# Patient Record
Sex: Male | Born: 1960 | Hispanic: Refuse to answer | Smoking: Former smoker
Health system: Southern US, Community
[De-identification: ages and names within clinical notes are randomized; demographics above are authoritative.]

## PROBLEM LIST (undated history)

## (undated) DIAGNOSIS — K219 Gastro-esophageal reflux disease without esophagitis: Secondary | ICD-10-CM

## (undated) DIAGNOSIS — E119 Type 2 diabetes mellitus without complications: Secondary | ICD-10-CM

## (undated) DIAGNOSIS — E7800 Pure hypercholesterolemia, unspecified: Secondary | ICD-10-CM

## (undated) DIAGNOSIS — E039 Hypothyroidism, unspecified: Secondary | ICD-10-CM

## (undated) DIAGNOSIS — J40 Bronchitis, not specified as acute or chronic: Secondary | ICD-10-CM

## (undated) DIAGNOSIS — I2510 Atherosclerotic heart disease of native coronary artery without angina pectoris: Secondary | ICD-10-CM

## (undated) DIAGNOSIS — Z8701 Personal history of pneumonia (recurrent): Secondary | ICD-10-CM

## (undated) DIAGNOSIS — J449 Chronic obstructive pulmonary disease, unspecified: Secondary | ICD-10-CM

## (undated) DIAGNOSIS — Z720 Tobacco use: Secondary | ICD-10-CM

## (undated) DIAGNOSIS — J069 Acute upper respiratory infection, unspecified: Secondary | ICD-10-CM

## (undated) DIAGNOSIS — G4730 Sleep apnea, unspecified: Secondary | ICD-10-CM

## (undated) HISTORY — PX: MANDIBLE SURGERY: SHX707

## (undated) HISTORY — DX: Acute upper respiratory infection, unspecified: J06.9

## (undated) HISTORY — PX: OTHER SURGICAL HISTORY: SHX169

---

## 2010-05-22 ENCOUNTER — Ambulatory Visit: Payer: Self-pay | Admitting: Cardiology

## 2011-06-15 ENCOUNTER — Encounter: Payer: Self-pay | Admitting: Cardiovascular Disease

## 2011-06-27 ENCOUNTER — Encounter: Payer: Self-pay | Admitting: *Deleted

## 2011-06-29 ENCOUNTER — Encounter: Payer: Self-pay | Admitting: Cardiovascular Disease

## 2011-07-02 ENCOUNTER — Encounter: Payer: Self-pay | Admitting: Cardiovascular Disease

## 2012-12-19 ENCOUNTER — Emergency Department (HOSPITAL_COMMUNITY): Payer: Self-pay

## 2012-12-19 ENCOUNTER — Emergency Department (HOSPITAL_COMMUNITY)
Admission: EM | Admit: 2012-12-19 | Discharge: 2012-12-19 | Disposition: A | Discharge status: Home / Self Care | Payer: Self-pay | Attending: Emergency Medicine | Admitting: Emergency Medicine

## 2012-12-19 ENCOUNTER — Encounter (HOSPITAL_COMMUNITY): Payer: Self-pay

## 2012-12-19 ENCOUNTER — Other Ambulatory Visit: Payer: Self-pay

## 2012-12-19 DIAGNOSIS — Z79899 Other long term (current) drug therapy: Secondary | ICD-10-CM | POA: Insufficient documentation

## 2012-12-19 DIAGNOSIS — M542 Cervicalgia: Secondary | ICD-10-CM | POA: Insufficient documentation

## 2012-12-19 DIAGNOSIS — Z7982 Long term (current) use of aspirin: Secondary | ICD-10-CM | POA: Insufficient documentation

## 2012-12-19 DIAGNOSIS — R209 Unspecified disturbances of skin sensation: Secondary | ICD-10-CM | POA: Insufficient documentation

## 2012-12-19 DIAGNOSIS — R200 Anesthesia of skin: Secondary | ICD-10-CM

## 2012-12-19 DIAGNOSIS — J45909 Unspecified asthma, uncomplicated: Secondary | ICD-10-CM | POA: Insufficient documentation

## 2012-12-19 DIAGNOSIS — F17200 Nicotine dependence, unspecified, uncomplicated: Secondary | ICD-10-CM | POA: Insufficient documentation

## 2012-12-19 DIAGNOSIS — E039 Hypothyroidism, unspecified: Secondary | ICD-10-CM | POA: Insufficient documentation

## 2012-12-19 LAB — BASIC METABOLIC PANEL
BUN: 13 mg/dL (ref 6–23)
Chloride: 95 mEq/L — ABNORMAL LOW (ref 96–112)
Glucose, Bld: 101 mg/dL — ABNORMAL HIGH (ref 70–99)
Potassium: 3.5 mEq/L (ref 3.5–5.1)

## 2012-12-19 LAB — CBC WITH DIFFERENTIAL/PLATELET
Basophils Relative: 1 % (ref 0–1)
HCT: 46.4 % (ref 39.0–52.0)
Hemoglobin: 16.5 g/dL (ref 13.0–17.0)
Lymphocytes Relative: 48 % — ABNORMAL HIGH (ref 12–46)
MCHC: 35.6 g/dL (ref 30.0–36.0)
Monocytes Relative: 9 % (ref 3–12)
Neutro Abs: 5.3 10*3/uL (ref 1.7–7.7)
WBC: 13.3 10*3/uL — ABNORMAL HIGH (ref 4.0–10.5)

## 2012-12-19 LAB — TROPONIN I: Troponin I: <0.3 ng/mL (ref <0.30)

## 2012-12-19 NOTE — ED Notes (Signed)
C/o numbness to left side of neck x 2 days. Denies sob or cp. Able to reproduce pain to neck and arm with palpation per pt.

## 2012-12-19 NOTE — ED Provider Notes (Signed)
History  This chart was scribed for Joya Gaskins, MD by Bennett Scrape, ED Scribe. This patient was seen in room APA15/APA15 and the patient's care was started at 2:11 PM.  CSN: [id number]  Arrival date & time 12/19/12  1356   First MD Initiated Contact with Patient 12/19/12 1411      Chief Complaint  Patient presents with  . Numbness     The history is provided by the patient. No language interpreter was used.    James Huynh is a 52 y.o. male, brought in by ambulance, who presents to the Emergency Department complaining of 2 days of graudal onset, non-changing, constant left-sided neck numbness with mild associated left-sided tenderness and separate left arm tingling. He denies radiation from the back of his neck or down his arm. He denies any modifying factors, specifically that swallowing worsens the pain. He denies having prior episodes of similar symptoms. He denies any recent falls or trauma. He denies extremity weakness, HAs, visual changes, dizziness, SOB and trouble swallowing as associated symptoms.  He reports three prior episodes of CP in the past and states that he has had prior negative stress tests, last one was in June 2013, but denies CP today. He has a h/o hypothyroidism, HLD, and asthma. He is a current everyday smoker but denies alcohol use.  Past Medical History  Diagnosis Date  . Thyroid disease     hypothyroidism  . Asthma   . Chest pain     negative stress echo August 2011  . Nicotine abuse     Past Surgical History  Procedure Laterality Date  . Lymph gland removal    . Mandible surgery      broken jaw    Family History  Problem Relation Age of Onset  . Heart attack Father     history of MI in his 76's with bypass surgery    History  Substance Use Topics  . Smoking status: Current Every Day Smoker -- 1.00 packs/day  . Smokeless tobacco: Not on file  . Alcohol Use: No      Review of Systems  HENT: Negative for trouble swallowing.    Eyes: Negative for visual disturbance.  Respiratory: Negative for shortness of breath.   Cardiovascular: Negative for chest pain.  Gastrointestinal: Negative for abdominal pain.  Neurological: Positive for numbness. Negative for dizziness, speech difficulty, weakness, light-headedness and headaches.  All other systems reviewed and are negative.    Allergies  Penicillins  Home Medications   Current Outpatient Rx  Name  Route  Sig  Dispense  Refill  . albuterol (PROVENTIL HFA;VENTOLIN HFA) 108 (90 BASE) MCG/ACT inhaler   Inhalation   Inhale 2 puffs into the lungs every 6 (six) hours as needed.           [name] albuterol (PROVENTIL) (2.5 MG/3ML) 0.083% nebulizer solution   Nebulization   Take 2.5 mg by nebulization every 6 (six) hours as needed.           [name] aspirin 81 MG tablet   Oral   Take 81 mg by mouth daily.           [name] levothyroxine (SYNTHROID, LEVOTHROID) 175 MCG tablet   Oral   Take 175 mcg by mouth daily.             Triage Vitals: BP 108/76  Pulse 95  Temp(Src) 97.8 F (36.6 C)  Resp 16  SpO2 97%  Physical Exam  Nursing note and vitals reviewed.  CONSTITUTIONAL: Well developed/well nourished HEAD: Normocephalic/atraumatic EYES: EOMI/PERRL, no nystagmus ENMT: Mucous membranes moist, handling secretions NECK: supple no meningeal signs, no reproducible tenderness upon palpation No carotid bruits SPINE:entire spine nontender CV: S1/S2 noted, no murmurs/rubs/gallops noted LUNGS: Lungs are clear to auscultation bilaterally, no apparent distress ABDOMEN: soft, nontender, no rebound or guarding GU:no cva tenderness NEURO: Awake/alert, facies symmetric, no arm or leg drift is noted, normal finger to nose, reports numbness in left medial bicep and dorsal aspect of left forearm only.   Equal power with hand grip, wrist flex/extension, elbow flex/extension and shoulder abduction Cranial nerves 3/4/5/6/04/22/09/11/12 tested and intact Gait normal EXTREMITIES:  pulses normal, full ROM SKIN: warm, color normal PSYCH: no abnormalities of mood noted  ED Course  Procedures (including critical care time)  DIAGNOSTIC STUDIES: Oxygen Saturation is 97% on room air, adequate by my interpretation.    COORDINATION OF CARE: 2:36 PM-Discussed treatment plan which includes XR of neck, CT of head, CBC panel and UA with pt at bedside and pt agreed to plan.  Pt with scattered numbness and "tingling" in left UE but does not conform to true radiculopathy or even acute CVA Imaging ordered as precaution to rule out any space occupying lesion or intracranial abnormality or any bony deformity to cspine   If negative will be stable for d/c and followup as outpatient  Labs Reviewed  CBC WITH DIFFERENTIAL - Abnormal; Notable for the following:    WBC 13.3 (*)    All other components within normal limits  TROPONIN I  BASIC METABOLIC PANEL      MDM  Nursing notes including past medical history and social history reviewed and considered in documentation Labs/vital reviewed and considered xrays reviewed and considered       Date: 12/19/2012  Rate: 79  Rhythm: normal sinus rhythm  QRS Axis: normal  Intervals: normal  ST/T Wave abnormalities: nonspecific ST changes  Conduction Disutrbances:none  Narrative Interpretation:   Old EKG Reviewed: unchanged    I personally performed the services described in this documentation, which was scribed in my presence. The recorded information has been reviewed and is accurate.          Joya Gaskins, MD 12/19/12 1739

## 2013-01-18 ENCOUNTER — Emergency Department (HOSPITAL_COMMUNITY): Payer: Self-pay

## 2013-01-18 ENCOUNTER — Emergency Department (HOSPITAL_COMMUNITY)
Admission: EM | Admit: 2013-01-18 | Discharge: 2013-01-18 | Disposition: A | Discharge status: Home / Self Care | Payer: Self-pay | Attending: Emergency Medicine | Admitting: Emergency Medicine

## 2013-01-18 ENCOUNTER — Encounter (HOSPITAL_COMMUNITY): Payer: Self-pay | Admitting: Emergency Medicine

## 2013-01-18 DIAGNOSIS — Z79899 Other long term (current) drug therapy: Secondary | ICD-10-CM | POA: Insufficient documentation

## 2013-01-18 DIAGNOSIS — Z7982 Long term (current) use of aspirin: Secondary | ICD-10-CM | POA: Insufficient documentation

## 2013-01-18 DIAGNOSIS — X58XXXA Exposure to other specified factors, initial encounter: Secondary | ICD-10-CM | POA: Insufficient documentation

## 2013-01-18 DIAGNOSIS — F17200 Nicotine dependence, unspecified, uncomplicated: Secondary | ICD-10-CM | POA: Insufficient documentation

## 2013-01-18 DIAGNOSIS — Y939 Activity, unspecified: Secondary | ICD-10-CM | POA: Insufficient documentation

## 2013-01-18 DIAGNOSIS — R059 Cough, unspecified: Secondary | ICD-10-CM | POA: Insufficient documentation

## 2013-01-18 DIAGNOSIS — Z8679 Personal history of other diseases of the circulatory system: Secondary | ICD-10-CM | POA: Insufficient documentation

## 2013-01-18 DIAGNOSIS — R05 Cough: Secondary | ICD-10-CM | POA: Insufficient documentation

## 2013-01-18 DIAGNOSIS — J45909 Unspecified asthma, uncomplicated: Secondary | ICD-10-CM | POA: Insufficient documentation

## 2013-01-18 DIAGNOSIS — S2232XA Fracture of one rib, left side, initial encounter for closed fracture: Secondary | ICD-10-CM

## 2013-01-18 DIAGNOSIS — Z8701 Personal history of pneumonia (recurrent): Secondary | ICD-10-CM | POA: Insufficient documentation

## 2013-01-18 DIAGNOSIS — R209 Unspecified disturbances of skin sensation: Secondary | ICD-10-CM | POA: Insufficient documentation

## 2013-01-18 DIAGNOSIS — Y929 Unspecified place or not applicable: Secondary | ICD-10-CM | POA: Insufficient documentation

## 2013-01-18 DIAGNOSIS — S2239XA Fracture of one rib, unspecified side, initial encounter for closed fracture: Secondary | ICD-10-CM | POA: Insufficient documentation

## 2013-01-18 DIAGNOSIS — E039 Hypothyroidism, unspecified: Secondary | ICD-10-CM | POA: Insufficient documentation

## 2013-01-18 LAB — COMPREHENSIVE METABOLIC PANEL
ALT: 30 U/L (ref 0–53)
CO2: 26 mEq/L (ref 19–32)
Calcium: 9.2 mg/dL (ref 8.4–10.5)
GFR calc Af Amer: 76 mL/min — ABNORMAL LOW (ref >90)
GFR calc non Af Amer: 66 mL/min — ABNORMAL LOW (ref >90)
Glucose, Bld: 112 mg/dL — ABNORMAL HIGH (ref 70–99)
Sodium: 129 mEq/L — ABNORMAL LOW (ref 135–145)
Total Bilirubin: 0.3 mg/dL (ref 0.3–1.2)

## 2013-01-18 LAB — POCT I-STAT, CHEM 8
Calcium, Ion: 0.8 mmol/L — ABNORMAL LOW (ref 1.12–1.23)
Glucose, Bld: 109 mg/dL — ABNORMAL HIGH (ref 70–99)
HCT: 42 % (ref 39.0–52.0)
Hemoglobin: 14.3 g/dL (ref 13.0–17.0)
Potassium: 5.1 mEq/L (ref 3.5–5.1)
TCO2: 19 mmol/L (ref 0–100)

## 2013-01-18 LAB — CBC WITH DIFFERENTIAL/PLATELET
Basophils Relative: 0 % (ref 0–1)
Eosinophils Relative: 1 % (ref 0–5)
Hemoglobin: 15.3 g/dL (ref 13.0–17.0)
Lymphs Abs: 4.2 10*3/uL — ABNORMAL HIGH (ref 0.7–4.0)
MCH: 32.2 pg (ref 26.0–34.0)
MCV: 88.6 fL (ref 78.0–100.0)
Monocytes Absolute: 1.2 10*3/uL — ABNORMAL HIGH (ref 0.1–1.0)
RBC: 4.75 MIL/uL (ref 4.22–5.81)

## 2013-01-18 MED ORDER — SODIUM CHLORIDE 0.9 % IV BOLUS (SEPSIS)
500.0000 mL | Freq: Once | INTRAVENOUS | Status: AC
  Administered 2013-01-18: 500 mL via INTRAVENOUS

## 2013-01-18 MED ORDER — OXYCODONE-ACETAMINOPHEN 5-325 MG PO TABS: ORAL_TABLET | ORAL | Status: DC

## 2013-01-18 MED ORDER — BENZONATATE 100 MG PO CAPS: 100.0000 mg | ORAL_CAPSULE | Freq: Three times a day (TID) | ORAL | Status: DC | PRN

## 2013-01-18 MED ORDER — ALBUTEROL SULFATE (2.5 MG/3ML) 0.083% IN NEBU: 2.5000 mg | INHALATION_SOLUTION | RESPIRATORY_TRACT | Status: DC | PRN

## 2013-01-18 MED ORDER — ONDANSETRON HCL 4 MG/2ML IJ SOLN
4.0000 mg | Freq: Once | INTRAMUSCULAR | Status: DC
  Filled 2013-01-18: qty 2

## 2013-01-18 MED ORDER — ONDANSETRON HCL 4 MG/2ML IJ SOLN
4.0000 mg | INTRAMUSCULAR | Status: DC | PRN
  Administered 2013-01-18: 4 mg via INTRAVENOUS

## 2013-01-18 MED ORDER — SODIUM CHLORIDE 0.9 % IV SOLN: INTRAVENOUS | Status: DC

## 2013-01-18 MED ORDER — MORPHINE SULFATE 4 MG/ML IJ SOLN
4.0000 mg | INTRAMUSCULAR | Status: DC | PRN
  Administered 2013-01-18: 4 mg via INTRAVENOUS
  Filled 2013-01-18: qty 1

## 2013-01-18 MED ORDER — METHOCARBAMOL 500 MG PO TABS: 1000.0000 mg | ORAL_TABLET | Freq: Four times a day (QID) | ORAL | Status: DC | PRN

## 2013-01-18 NOTE — ED Provider Notes (Signed)
History     CSN: [id number]  Arrival date & time 01/18/13  1256   First MD Initiated Contact with Patient 01/18/13 1346      Chief Complaint  Patient presents with  . Chest Pain  . Numbness     HPI Pt was seen at 1415.   Per pt, c/o gradual onset and persistence of constant left sided chest "pain" for the past 1 week.  Has been associated with cough.  States he was eval by the [facility] for same, dx pneumonia, rx MDI and antibiotic with partial relief of his symptoms of SOB and cough. States he "sneezed really hard" yesterday which caused an increase in his left sided chest pain.  Describes his chest pain as constant, "sharp," and worsens with cough, deep breath, palpation of the area, body position changes and movement of his left arm.  Denies abd pain, no N/V/D, no palpitations, no wheezing, no back pain, no fevers, no rash.     Past Medical History  Diagnosis Date  . Thyroid disease     hypothyroidism  . Asthma   . Chest pain     negative stress echo August 2011  . Nicotine abuse   . Pneumonia     Past Surgical History  Procedure Laterality Date  . Lymph gland removal    . Mandible surgery      broken jaw    Family History  Problem Relation Age of Onset  . Heart attack Father     history of MI in his 58's with bypass surgery    History  Substance Use Topics  . Smoking status: Current Every Day Smoker -- 0.50 packs/day for 25 years    Types: Cigarettes  . Smokeless tobacco: Never Used  . Alcohol Use: No     Review of Systems ROS: Statement: All systems negative except as marked or noted in the HPI; Constitutional: Negative for fever and chills. ; ; Eyes: Negative for eye pain, redness and discharge. ; ; ENMT: Negative for ear pain, hoarseness, nasal congestion, sinus pressure and sore throat. ; ; Cardiovascular: +CP. Negative for palpitations, diaphoresis, dyspnea and peripheral edema. ; ; Respiratory: +cough. Negative for wheezing and stridor. ; ;  Gastrointestinal: Negative for nausea, vomiting, diarrhea, abdominal pain, blood in stool, hematemesis, jaundice and rectal bleeding. . ; ; Genitourinary: Negative for dysuria, flank pain and hematuria. ; ; Musculoskeletal: Negative for back pain and neck pain. Negative for swelling and trauma.; ; Skin: Negative for pruritus, rash, abrasions, blisters, bruising and skin lesion.; ; Neuro: Negative for headache, lightheadedness and neck stiffness. Negative for weakness, altered level of consciousness, altered mental status, extremity weakness, paresthesias, involuntary movement, seizure and syncope.      Allergies  Penicillins  Home Medications   Current Outpatient Rx  Name  Route  Sig  Dispense  Refill  . albuterol (PROAIR HFA) 108 (90 BASE) MCG/ACT inhaler   Inhalation   Inhale 2 puffs into the lungs every 6 (six) hours as needed for wheezing or shortness of breath.         [name] albuterol (PROVENTIL) (2.5 MG/3ML) 0.083% nebulizer solution   Nebulization   Take 2.5 mg by nebulization every 6 (six) hours as needed for wheezing or shortness of breath.          [name] aspirin 81 MG tablet   Oral   Take 81 mg by mouth every evening.          . hydrochlorothiazide (HYDRODIURIL) 25 MG tablet  Oral   Take 25 mg by mouth daily.         [name] levothyroxine (SYNTHROID, LEVOTHROID) 175 MCG tablet   Oral   Take 175 mcg by mouth daily.           . Omega-3 Fatty Acids (FISH OIL) 1000 MG CAPS   Oral   Take 3,000 mg by mouth every evening.         [name] omeprazole (PRILOSEC) 20 MG capsule   Oral   Take 20 mg by mouth daily.         . pravastatin (PRAVACHOL) 40 MG tablet   Oral   Take 40 mg by mouth at bedtime.           BP 100/63  Pulse 73  Temp(Src) 98.9 F (37.2 C) (Oral)  Resp 20  SpO2 97%  Physical Exam 1420: Physical examination:  Nursing notes reviewed; Vital signs and O2 SAT reviewed;  Constitutional: Well developed, Well nourished, Well hydrated, Uncomfortable  appearing.; Head:  Normocephalic, atraumatic; Eyes: EOMI, PERRL, No scleral icterus; ENMT: Mouth and pharynx normal, Mucous membranes moist; Neck: Supple, Full range of motion, No lymphadenopathy; Cardiovascular: Regular rate and rhythm, No murmur, rub, or gallop; Respiratory: Breath sounds clear & equal bilaterally, No rales, rhonchi, wheezes.  Speaking full sentences with ease, Normal respiratory effort/excursion; Chest: +left mid anterior-lateral chest wall tender to palp. No soft tissue crepitus, no rash. Movement normal; Abdomen: Soft, Nontender, Nondistended, Normal bowel sounds;; Extremities: Pulses normal, No tenderness, No edema, No calf edema or asymmetry.; Neuro: AA&Ox3, Major CN grossly intact.  Speech clear. No gross focal motor or sensory deficits in extremities.; Skin: Color normal, Warm, Dry.   ED Course  Procedures     MDM  MDM Reviewed: previous chart, nursing note and vitals Reviewed previous: ECG and labs Interpretation: ECG, labs and x-ray    Date: 01/18/2013  Rate: 75  Rhythm: normal sinus rhythm  QRS Axis: normal  Intervals: normal  ST/T Wave abnormalities: nonspecific ST/T changes  Conduction Disutrbances:none  Narrative Interpretation:   Old EKG Reviewed: unchanged; no significant changes from previous EKG dated 12/19/2012.   Results for orders placed during the hospital encounter of 01/18/13  TROPONIN I      Result Value Range   Troponin I <0.30  <0.30 ng/mL  CBC WITH DIFFERENTIAL      Result Value Range   WBC 13.0 (*) 4.0 - 10.5 K/uL   RBC 4.75  4.22 - 5.81 MIL/uL   Hemoglobin 15.3  13.0 - 17.0 g/dL   HCT 28.4  13.2 - 44.0 %   MCV 88.6  78.0 - 100.0 fL   MCH 32.2  26.0 - 34.0 pg   MCHC 36.3 (*) 30.0 - 36.0 g/dL   RDW 10.2  72.5 - 36.6 %   Platelets 249  150 - 400 K/uL   Neutrophils Relative 58  43 - 77 %   Lymphocytes Relative 32  12 - 46 %   Monocytes Relative 9  3 - 12 %   Eosinophils Relative 1  0 - 5 %   Basophils Relative 0  0 - 1 %   Neutro  Abs 7.5  1.7 - 7.7 K/uL   Lymphs Abs 4.2 (*) 0.7 - 4.0 K/uL   Monocytes Absolute 1.2 (*) 0.1 - 1.0 K/uL   Eosinophils Absolute 0.1  0.0 - 0.7 K/uL   Basophils Absolute 0.0  0.0 - 0.1 K/uL   WBC Morphology ATYPICAL LYMPHOCYTES    COMPREHENSIVE METABOLIC  PANEL      Result Value Range   Sodium 129 (*) 135 - 145 mEq/L   Potassium 3.8  3.5 - 5.1 mEq/L   Chloride 91 (*) 96 - 112 mEq/L   CO2 26  19 - 32 mEq/L   Glucose, Bld 112 (*) 70 - 99 mg/dL   BUN 10  6 - 23 mg/dL   Creatinine, Ser 1.61  0.50 - 1.35 mg/dL   Calcium 9.2  8.4 - 09.6 mg/dL   Total Protein 7.3  6.0 - 8.3 g/dL   Albumin 3.8  3.5 - 5.2 g/dL   AST 20  0 - 37 U/L   ALT 30  0 - 53 U/L   Alkaline Phosphatase 75  39 - 117 U/L   Total Bilirubin 0.3  0.3 - 1.2 mg/dL   GFR calc non Af Amer 66 (*) >90 mL/min   GFR calc Af Amer 76 (*) >90 mL/min  D-DIMER, QUANTITATIVE      Result Value Range   D-Dimer, Quant <0.27  0.00 - 0.48 ug/mL-FEU  POCT I-STAT, CHEM 8      Result Value Range   Sodium 130 (*) 135 - 145 mEq/L   Potassium 5.1  3.5 - 5.1 mEq/L   Chloride 103  96 - 112 mEq/L   BUN 9  6 - 23 mg/dL   Creatinine, Ser 0.45  0.50 - 1.35 mg/dL   Glucose, Bld 409 (*) 70 - 99 mg/dL   Calcium, Ion 8.11 (*) 1.12 - 1.23 mmol/L   TCO2 19  0 - 100 mmol/L   Hemoglobin 14.3  13.0 - 17.0 g/dL   HCT 91.4  78.2 - 95.6 %   Dg Ribs Unilateral W/chest Left 01/18/2013  *RADIOLOGY REPORT*  Clinical Data: Cough and left rib pain.  LEFT RIBS AND CHEST - 3+ VIEW  Comparison: 01/21/2012  Findings: Frontal chest radiograph shows no evidence of infiltrates, edema, pneumothorax or pleural effusion.  The heart size is normal.  Additional left rib films show a minimally displaced fracture involving the posterolateral aspect of the left sixth rib.  No other fractures identified.  No underlying bony lesions are seen.  IMPRESSION: Minimally displaced fracture of the left sixth rib.  No associated pneumothorax or hemothorax.   Original Report Authenticated By:  Irish Lack, M.D.     1600:  Pt eating fast food meal and drink while in the ED without distress or N/V.  Resps easy, Sats 97% R/A.  Na increasing with IVF and PO fluids/foods.  VS remain per pt's baseline, per EPIC chart review.  Sats remain 97% R/A.  Left rib fx on XR; will tx with I.S. and pain meds.  Doubt PE with negative d-dimer and low risk Well's. Doubt ACS with constant chest pain x1 week, normal troponin and unchanged EKG from previous.  Pt states he needs a refill of his albuterol neb soln.  Wants to go home now.  Dx and testing d/w pt and family.  Questions answered.  Verb understanding, agreeable to d/c home with outpt f/u.                 Laray Anger, DO 01/20/13 1410

## 2013-01-18 NOTE — ED Notes (Signed)
Patient states that he started having pain under his left breast 1 week ago and was diagnosed with pneumonia at the same time that day by the health department.  The patient states that today, the pain is worse under his left breast.

## 2013-01-18 NOTE — ED Notes (Addendum)
Patient c/o left side chest pain with numbness down left arm. Patient reports being diagnosed left side pneumonia. Denies any shortness of breath. Patient reports taking Tizanadine and Tramadol with no relief.

## 2013-05-12 ENCOUNTER — Emergency Department (HOSPITAL_COMMUNITY)
Admission: EM | Admit: 2013-05-12 | Discharge: 2013-05-12 | Disposition: A | Discharge status: Home / Self Care | Payer: Self-pay | Attending: Emergency Medicine | Admitting: Emergency Medicine

## 2013-05-12 ENCOUNTER — Encounter (HOSPITAL_COMMUNITY): Payer: Self-pay | Admitting: *Deleted

## 2013-05-12 DIAGNOSIS — R630 Anorexia: Secondary | ICD-10-CM | POA: Insufficient documentation

## 2013-05-12 DIAGNOSIS — S90569A Insect bite (nonvenomous), unspecified ankle, initial encounter: Secondary | ICD-10-CM | POA: Insufficient documentation

## 2013-05-12 DIAGNOSIS — N489 Disorder of penis, unspecified: Secondary | ICD-10-CM | POA: Insufficient documentation

## 2013-05-12 DIAGNOSIS — N4889 Other specified disorders of penis: Secondary | ICD-10-CM | POA: Insufficient documentation

## 2013-05-12 DIAGNOSIS — Z7982 Long term (current) use of aspirin: Secondary | ICD-10-CM | POA: Insufficient documentation

## 2013-05-12 DIAGNOSIS — N508 Other specified disorders of male genital organs: Secondary | ICD-10-CM | POA: Insufficient documentation

## 2013-05-12 DIAGNOSIS — W57XXXA Bitten or stung by nonvenomous insect and other nonvenomous arthropods, initial encounter: Secondary | ICD-10-CM | POA: Insufficient documentation

## 2013-05-12 DIAGNOSIS — R112 Nausea with vomiting, unspecified: Secondary | ICD-10-CM | POA: Insufficient documentation

## 2013-05-12 DIAGNOSIS — R142 Eructation: Secondary | ICD-10-CM | POA: Insufficient documentation

## 2013-05-12 DIAGNOSIS — F17200 Nicotine dependence, unspecified, uncomplicated: Secondary | ICD-10-CM | POA: Insufficient documentation

## 2013-05-12 DIAGNOSIS — Z79899 Other long term (current) drug therapy: Secondary | ICD-10-CM | POA: Insufficient documentation

## 2013-05-12 DIAGNOSIS — Z8701 Personal history of pneumonia (recurrent): Secondary | ICD-10-CM | POA: Insufficient documentation

## 2013-05-12 DIAGNOSIS — Y939 Activity, unspecified: Secondary | ICD-10-CM | POA: Insufficient documentation

## 2013-05-12 DIAGNOSIS — E7800 Pure hypercholesterolemia, unspecified: Secondary | ICD-10-CM | POA: Insufficient documentation

## 2013-05-12 DIAGNOSIS — Z880 Allergy status to penicillin: Secondary | ICD-10-CM | POA: Insufficient documentation

## 2013-05-12 DIAGNOSIS — R109 Unspecified abdominal pain: Secondary | ICD-10-CM | POA: Insufficient documentation

## 2013-05-12 DIAGNOSIS — Y929 Unspecified place or not applicable: Secondary | ICD-10-CM | POA: Insufficient documentation

## 2013-05-12 DIAGNOSIS — R300 Dysuria: Secondary | ICD-10-CM | POA: Insufficient documentation

## 2013-05-12 DIAGNOSIS — K219 Gastro-esophageal reflux disease without esophagitis: Secondary | ICD-10-CM | POA: Insufficient documentation

## 2013-05-12 DIAGNOSIS — E039 Hypothyroidism, unspecified: Secondary | ICD-10-CM | POA: Insufficient documentation

## 2013-05-12 DIAGNOSIS — J45909 Unspecified asthma, uncomplicated: Secondary | ICD-10-CM | POA: Insufficient documentation

## 2013-05-12 DIAGNOSIS — R141 Gas pain: Secondary | ICD-10-CM | POA: Insufficient documentation

## 2013-05-12 HISTORY — DX: Pure hypercholesterolemia, unspecified: E78.00

## 2013-05-12 LAB — RPR: RPR Ser Ql: NONREACTIVE

## 2013-05-12 NOTE — ED Notes (Signed)
Patient given Clindamycin injections yesterday at [facility] for spider bite.  Has also con't. Taking Bactrim  For prior spider bite on L shin.  Yesterday vomited 3/4 times.  No vomiting today, able to keep food and fluids down, but feels nauseated.

## 2013-05-12 NOTE — ED Notes (Signed)
Spider bite to left leg x 5 days.  Given abx - started vomiting yesterday with sharp lower abd pain today.  Also c/o blister like formation to penis x 2 days with burning with urination.

## 2013-05-12 NOTE — ED Provider Notes (Signed)
CSN: [id number]     Arrival date & time 05/12/13  1023 History  This chart was scribed for Ashby Dawes, MD, by Yevette Edwards, ED Scribe. This patient was seen in room APA11/APA11 and the patient's care was started at 11:26 AM.   First MD Initiated Contact with Patient 05/12/13 1119     Chief Complaint  Patient presents with  . Emesis  . Insect Bite   The history is provided by the patient. No language interpreter was used.   HPI Comments: James Huynh is a 52 y.o. male who presents to the Emergency Department complaining of sudden-onset abdominal pain which began yesterday as he was attempting a BM. The pt describes the pain as "sharp," "twisting" and "like a knife."  The pt reports that the abdominal pain lessened upon standing.  He has also experienced emesis, flatulance and a diminished appetite as associated symptoms.  At bedside, the pt denies any current nausea or current abdominal pain. He denies experiencing any fever, constipation, or diarrhea.   The pt reports that he received two separate, suspected spider bites to his left leg, one on the posterior aspect of his leg five days ago and one to the anterior aspect of his leg three days ago.  Both times, he sought treatment for the suspected bites and was prescribed antibiotics. He states that he did not have any negative reactions to the clindamycin which he received intramuscularly at the hospital. The pt began taking the prescribed bactrim three days ago. He denies any other individuals in his home  being affected by spider bites. He is concerned that his abdominal pain might be related to the use of his Bactrim   He also reports that he has a blister on his penis which he first noticed two nights ago. He states that the blister is painful, burns, and has been secreting a fluid. He reports that the friction of his underwear and movement increases the pain.  Past Medical History  Diagnosis Date  . Thyroid disease    hypothyroidism  . Asthma   . Chest pain     negative stress echo August 2011  . Nicotine abuse   . Pneumonia   . Acid reflux   . High cholesterol    Past Surgical History  Procedure Laterality Date  . Lymph gland removal    . Mandible surgery      broken jaw   Family History  Problem Relation Age of Onset  . Heart attack Father     history of MI in his 41's with bypass surgery   History  Substance Use Topics  . Smoking status: Current Every Day Smoker -- 0.50 packs/day for 25 years    Types: Cigarettes  . Smokeless tobacco: Never Used  . Alcohol Use: No    Review of Systems  Constitutional: Negative for fever.  HENT: Negative for congestion and rhinorrhea.   Respiratory: Negative for cough.   Gastrointestinal: Positive for nausea, vomiting and abdominal pain. Negative for diarrhea and constipation.  Genitourinary: Positive for dysuria, genital sores and penile pain.  Skin: Positive for wound (Spider bite to left leg. ).  All other systems reviewed and are negative.    Allergies  Penicillins  Home Medications   Current Outpatient Rx  Name  Route  Sig  Dispense  Refill  . albuterol (PROAIR HFA) 108 (90 BASE) MCG/ACT inhaler   Inhalation   Inhale 2 puffs into the lungs every 6 (six) hours as needed for  wheezing or shortness of breath.         [name] albuterol (PROVENTIL) (2.5 MG/3ML) 0.083% nebulizer solution   Nebulization   Take 2.5 mg by nebulization every 6 (six) hours as needed for wheezing or shortness of breath.          [name] aspirin 81 MG tablet   Oral   Take 81 mg by mouth every evening.          . hydrochlorothiazide (HYDRODIURIL) 25 MG tablet   Oral   Take 25 mg by mouth daily as needed (for swelling).          [name] levothyroxine (SYNTHROID, LEVOTHROID) 175 MCG tablet   Oral   Take 175 mcg by mouth daily.           [name] lovastatin (MEVACOR) 20 MG tablet   Oral   Take 20 mg by mouth at bedtime.         [name] omeprazole (PRILOSEC) 20 MG capsule    Oral   Take 20 mg by mouth daily.         [name] sulfamethoxazole-trimethoprim (BACTRIM DS,SEPTRA DS) 800-160 MG per tablet   Oral   Take 2 tablets by mouth 2 (two) times daily. Starting 05/08/13 x 10 days.          Triage Vitals: BP 124/83  Pulse 94  Temp(Src) 97.8 F (36.6 C) (Oral)  Resp 20  Ht 5\' 10"  (1.778 m)  Wt 236 lb (107.049 kg)  BMI 33.86 kg/m2  Physical Exam  Nursing note and vitals reviewed. Constitutional: He is oriented to person, place, and time. He appears well-developed and well-nourished. No distress.  HENT:  Head: Normocephalic and atraumatic.  Eyes: EOM are normal.  Neck: Neck supple. No tracheal deviation present.  Cardiovascular: Normal rate, regular rhythm, normal heart sounds and intact distal pulses.   Pulmonary/Chest: Effort normal and breath sounds normal. No respiratory distress.  Abdominal: Soft. Bowel sounds are normal. He exhibits no distension and no mass. There is no tenderness. There is no rebound and no guarding.  Genitourinary:  Erythematous area of excoriation around the meatus and dorsum of the penis. No frank vesicles.   Musculoskeletal: Normal range of motion.  Neurological: He is alert and oriented to person, place, and time.  Skin: Skin is warm and dry.  No obvious abscess or cellulitis noted in the lower extremities.   Psychiatric: He has a normal mood and affect. His behavior is normal.    ED Course     COORDINATION OF CARE:  11:36 AM- Discussed treatment plan with patient, and the patient agreed to the plan.   Procedures (including critical care time)  Results for orders placed during the hospital encounter of 05/12/13  GC/CHLAMYDIA PROBE AMP      Result Value Range   CT Probe RNA NEGATIVE  NEGATIVE   GC Probe RNA NEGATIVE  NEGATIVE  RPR      Result Value Range   RPR NON REACTIVE  NON REACTIVE   No results found.    MDM  1. Abdominal pain: Patient has been pain-free since he had a bowel movement. He has an  nonsignificant abdominal exam in the emergency department. His vitals are all stable he is afebrile. No imaging is indicated. I gave the patient return precautions and recommend follow up with PCP.  2. Cellulitis/abscess: Skin appears to be improving with no abscess requiring I&D at this time. Will recommend that he continues taking Bactrim. No IV antibiotics indicated in the  emergency department.  3. Penile vesicles: Sent tzanck smear for possible herpes infection.  As the vesicles are already ruptured, will defer acyclovir treatment at this time since the dx seems equivocal.   I do not think that this is a reaction to the Bactrim, such as SJS, at this time, however, I did discuss this with the patient. I gave him return precautions regarding further rash.   I personally performed the services described in this documentation, which was scribed in my presence. The recorded information has been reviewed and is accurate.    Ashby Dawes, MD 05/17/13 [phone]

## 2014-03-05 IMAGING — CR DG CERVICAL SPINE COMPLETE 4+V
6 series · 6 of 6 positions shown · non-contrast
Comparison: None

CLINICAL DATA: Pain, left side neck numbness for 1 week, left arm
tingling today

CERVICAL SPINE - COMPLETE 4+ VIEW

[view not recorded (1 of 6)]
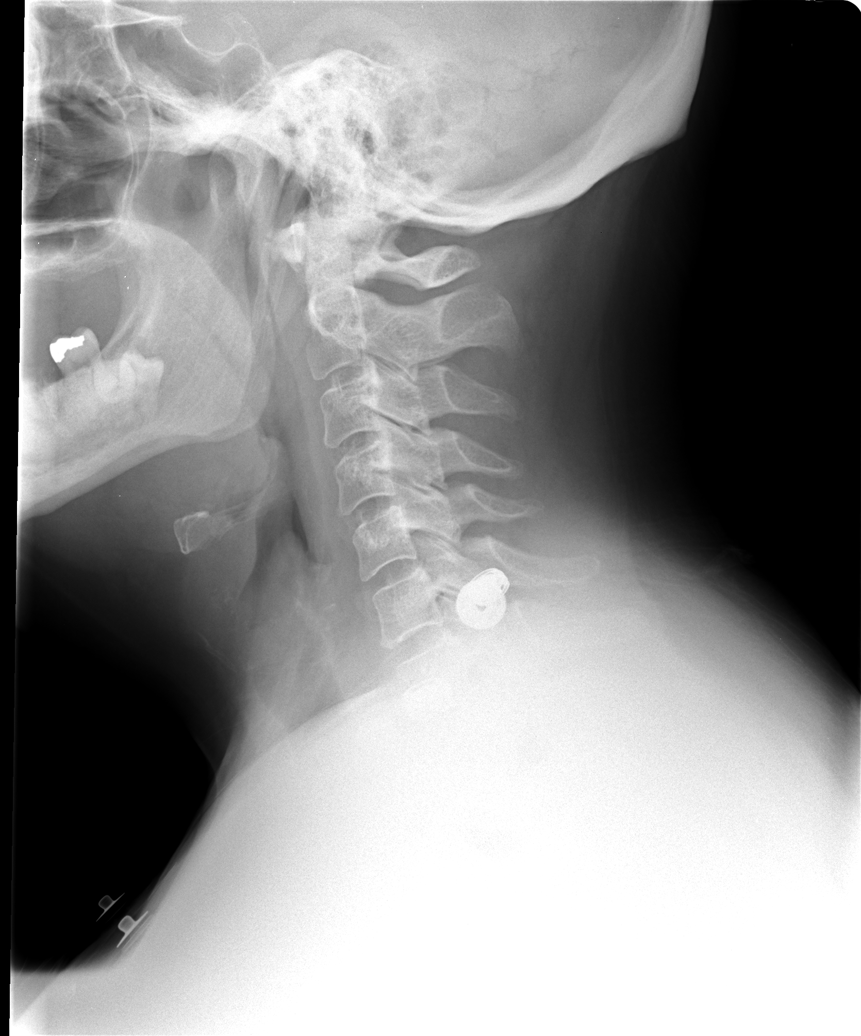

[view not recorded (2 of 6)]
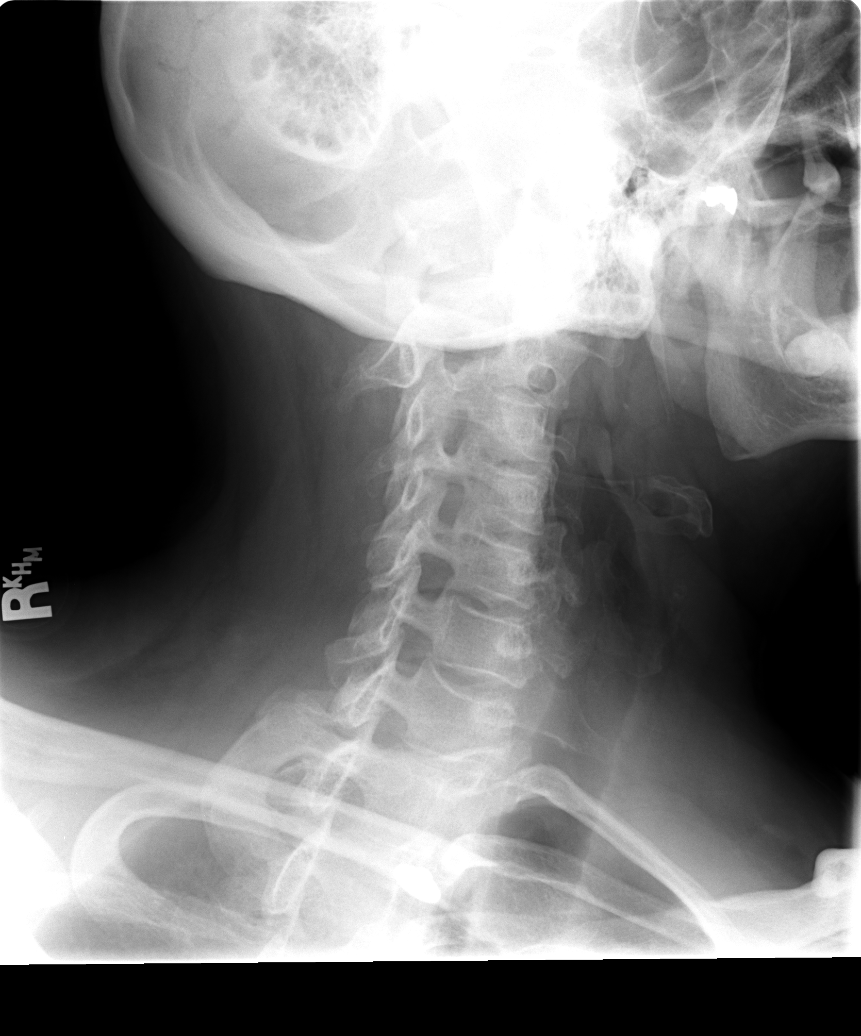

[view not recorded (3 of 6)]
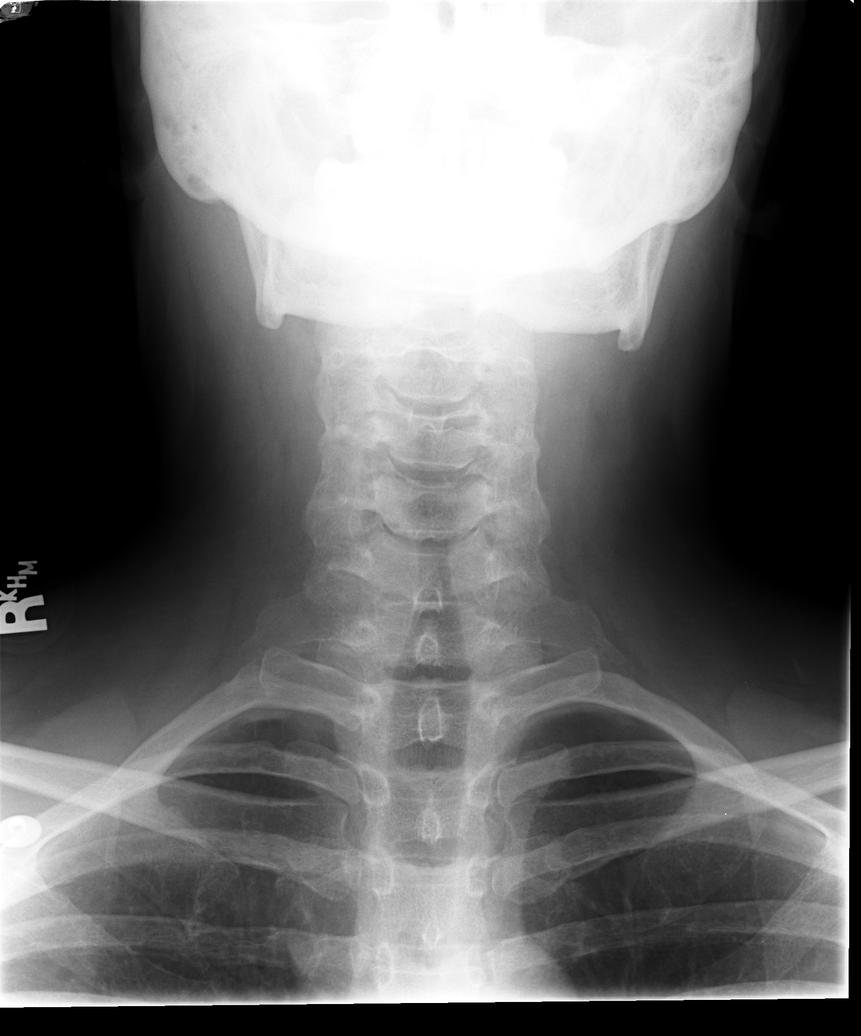

[view not recorded (4 of 6)]
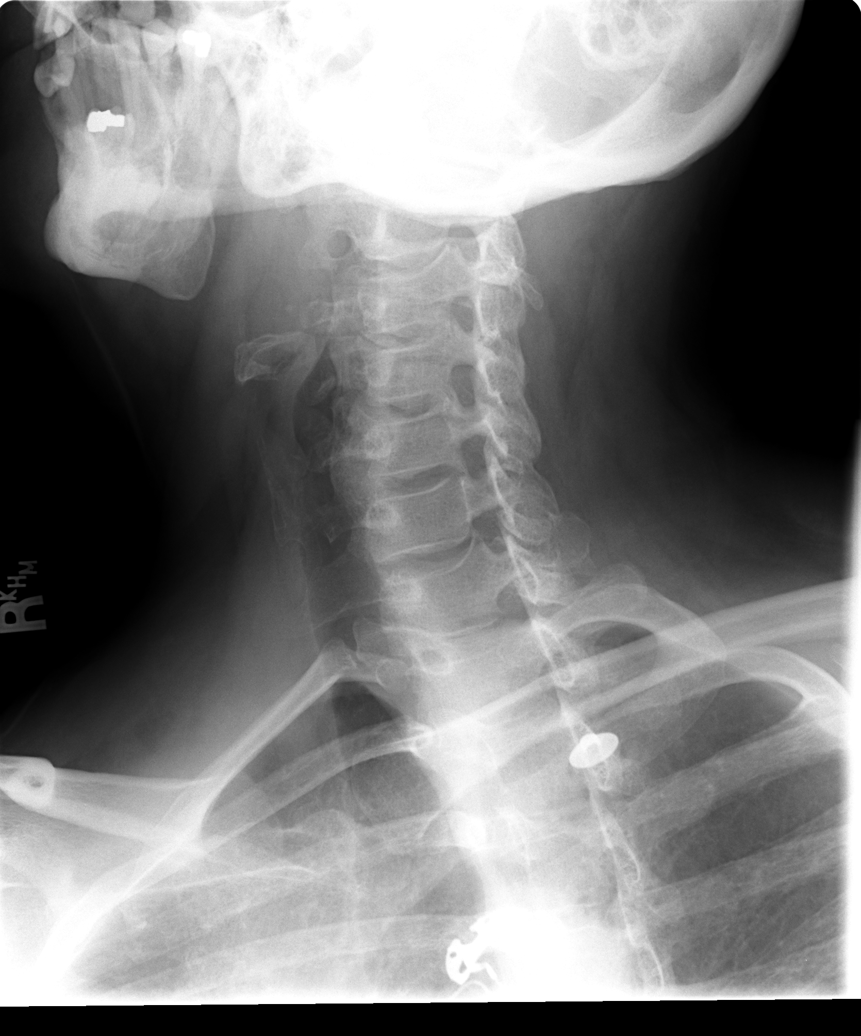

[view not recorded (5 of 6)]
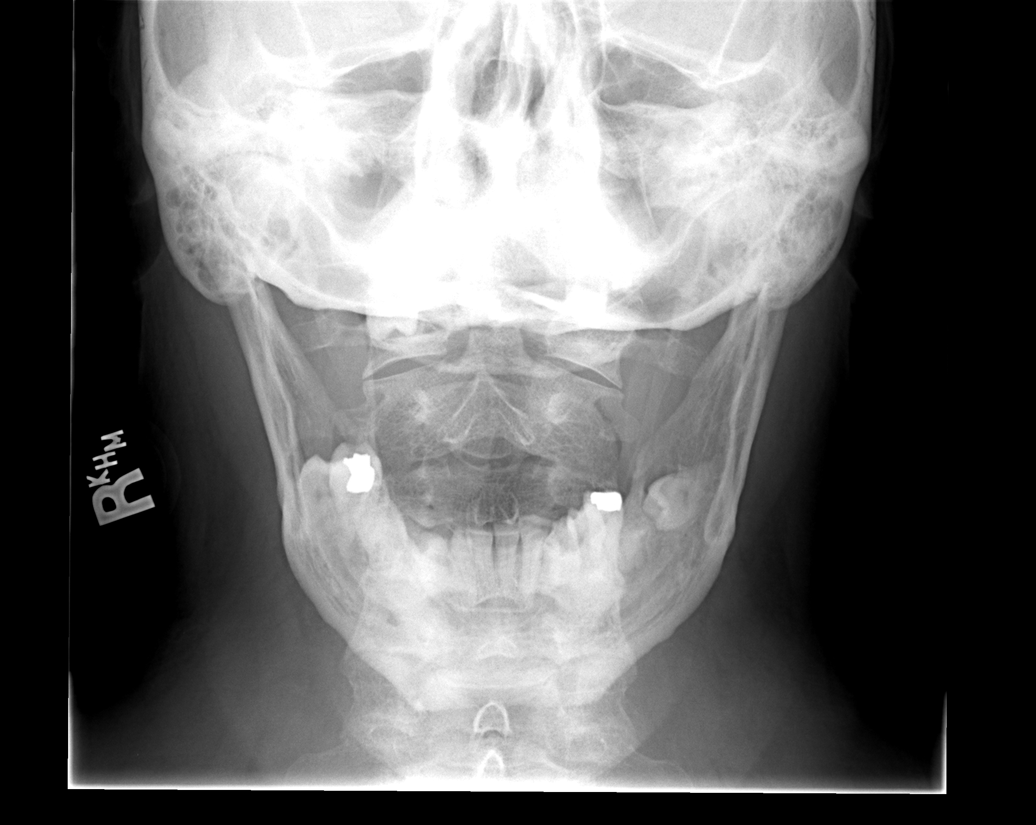

[view not recorded (6 of 6)]
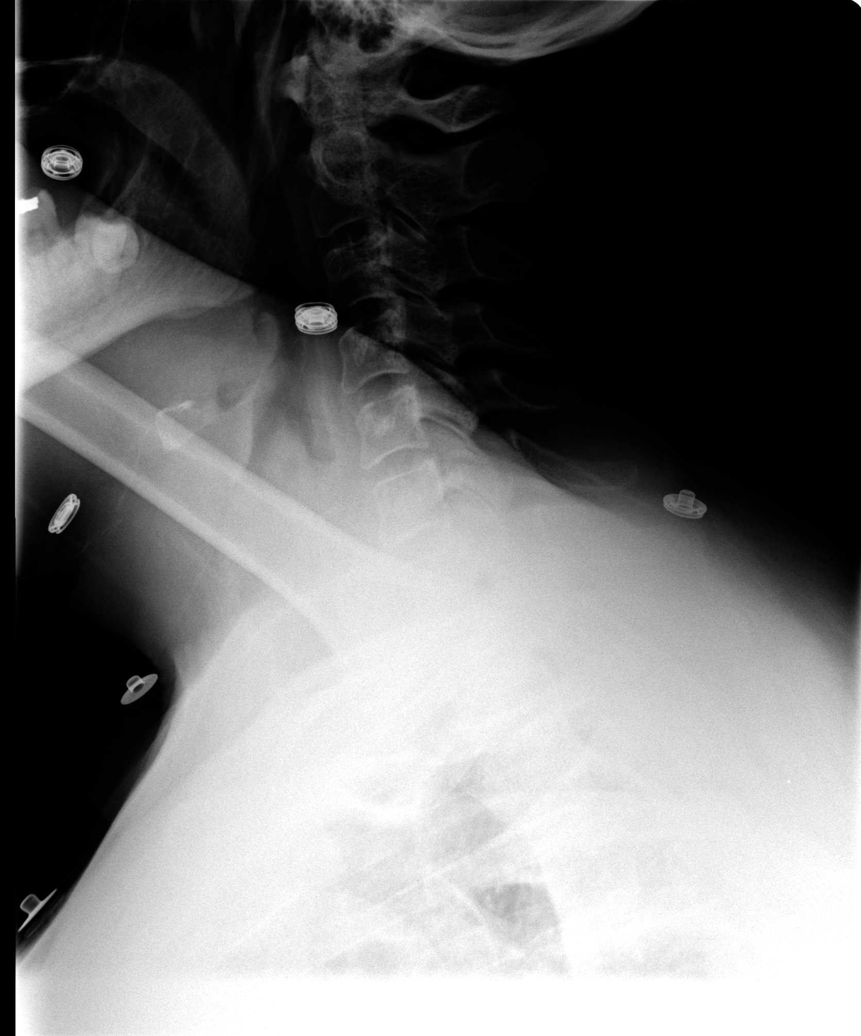

[6 of 6 positions shown; findings below may reference images not displayed]

FINDINGS: Prevertebral soft tissues normal thickness.
Vertebral body and disc space heights maintained.
No acute fracture, subluxation or bone destruction.
Apices clear.
C1-C2 alignment normal.
IMPRESSION: No acute cervical spine abnormalities.

## 2015-09-08 ENCOUNTER — Inpatient Hospital Stay (HOSPITAL_COMMUNITY)
Admission: AD | Admit: 2015-09-08 | Discharge: 2015-09-10 | DRG: 247 | Disposition: A | Discharge status: Home / Self Care | Payer: Self-pay | Source: Other Acute Inpatient Hospital | Attending: Cardiovascular Disease | Admitting: Cardiovascular Disease

## 2015-09-08 ENCOUNTER — Encounter (HOSPITAL_COMMUNITY): Payer: Self-pay | Admitting: Nurse Practitioner

## 2015-09-08 DIAGNOSIS — Z955 Presence of coronary angioplasty implant and graft: Secondary | ICD-10-CM

## 2015-09-08 DIAGNOSIS — F17210 Nicotine dependence, cigarettes, uncomplicated: Secondary | ICD-10-CM | POA: Diagnosis present

## 2015-09-08 DIAGNOSIS — I25110 Atherosclerotic heart disease of native coronary artery with unstable angina pectoris: Secondary | ICD-10-CM | POA: Diagnosis present

## 2015-09-08 DIAGNOSIS — E7800 Pure hypercholesterolemia, unspecified: Secondary | ICD-10-CM | POA: Diagnosis present

## 2015-09-08 DIAGNOSIS — R9431 Abnormal electrocardiogram [ECG] [EKG]: Secondary | ICD-10-CM

## 2015-09-08 DIAGNOSIS — Z6833 Body mass index (BMI) 33.0-33.9, adult: Secondary | ICD-10-CM

## 2015-09-08 DIAGNOSIS — Z7982 Long term (current) use of aspirin: Secondary | ICD-10-CM

## 2015-09-08 DIAGNOSIS — E038 Other specified hypothyroidism: Secondary | ICD-10-CM

## 2015-09-08 DIAGNOSIS — I214 Non-ST elevation (NSTEMI) myocardial infarction: Principal | ICD-10-CM | POA: Diagnosis present

## 2015-09-08 DIAGNOSIS — Z720 Tobacco use: Secondary | ICD-10-CM

## 2015-09-08 DIAGNOSIS — K219 Gastro-esophageal reflux disease without esophagitis: Secondary | ICD-10-CM | POA: Diagnosis present

## 2015-09-08 DIAGNOSIS — J45909 Unspecified asthma, uncomplicated: Secondary | ICD-10-CM | POA: Diagnosis present

## 2015-09-08 DIAGNOSIS — E785 Hyperlipidemia, unspecified: Secondary | ICD-10-CM | POA: Diagnosis present

## 2015-09-08 DIAGNOSIS — I209 Angina pectoris, unspecified: Secondary | ICD-10-CM

## 2015-09-08 DIAGNOSIS — E039 Hypothyroidism, unspecified: Secondary | ICD-10-CM | POA: Diagnosis present

## 2015-09-08 HISTORY — DX: Tobacco use: Z72.0

## 2015-09-08 HISTORY — DX: Personal history of pneumonia (recurrent): Z87.01

## 2015-09-08 HISTORY — DX: Atherosclerotic heart disease of native coronary artery without angina pectoris: I25.10

## 2015-09-08 HISTORY — DX: Hypothyroidism, unspecified: E03.9

## 2015-09-08 HISTORY — DX: Morbid (severe) obesity due to excess calories: E66.01

## 2015-09-08 HISTORY — DX: Gastro-esophageal reflux disease without esophagitis: K21.9

## 2015-09-08 LAB — TROPONIN I
Troponin I: 0.81 ng/mL (ref <0.031)
Troponin I: 0.8 ng/mL (ref <0.031)

## 2015-09-08 LAB — HEPARIN LEVEL (UNFRACTIONATED)

## 2015-09-08 LAB — TSH: TSH: 0.205 u[IU]/mL — ABNORMAL LOW (ref 0.350–4.500)

## 2015-09-08 MED ORDER — ATORVASTATIN CALCIUM 80 MG PO TABS
80.0000 mg | ORAL_TABLET | Freq: Every day | ORAL | Status: DC
  Administered 2015-09-08: 80 mg via ORAL
  Filled 2015-09-08: qty 1

## 2015-09-08 MED ORDER — ASPIRIN 81 MG PO TABS
81.0000 mg | ORAL_TABLET | Freq: Every evening | ORAL | Status: DC
  Filled 2015-09-08: qty 1

## 2015-09-08 MED ORDER — ASPIRIN 81 MG PO CHEW
81.0000 mg | CHEWABLE_TABLET | Freq: Every evening | ORAL | Status: DC
  Administered 2015-09-08: 81 mg via ORAL
  Filled 2015-09-08: qty 1

## 2015-09-08 MED ORDER — SODIUM CHLORIDE 0.9 % WEIGHT BASED INFUSION
1.0000 mL/kg/h | INTRAVENOUS | Status: DC
  Administered 2015-09-09 (×2): 1 mL/kg/h via INTRAVENOUS

## 2015-09-08 MED ORDER — SODIUM CHLORIDE 0.9 % IV SOLN: 250.0000 mL | INTRAVENOUS | Status: DC | PRN

## 2015-09-08 MED ORDER — HEPARIN BOLUS VIA INFUSION
4000.0000 [IU] | Freq: Once | INTRAVENOUS | Status: AC
  Administered 2015-09-08: 4000 [IU] via INTRAVENOUS
  Filled 2015-09-08: qty 4000

## 2015-09-08 MED ORDER — SODIUM CHLORIDE 0.9 % WEIGHT BASED INFUSION
3.0000 mL/kg/h | INTRAVENOUS | Status: DC
  Administered 2015-09-09: 3 mL/kg/h via INTRAVENOUS

## 2015-09-08 MED ORDER — ALBUTEROL SULFATE (2.5 MG/3ML) 0.083% IN NEBU
2.5000 mg | INHALATION_SOLUTION | Freq: Four times a day (QID) | RESPIRATORY_TRACT | Status: DC | PRN
  Administered 2015-09-09: 2.5 mg via RESPIRATORY_TRACT
  Filled 2015-09-08 (×2): qty 3

## 2015-09-08 MED ORDER — HEPARIN (PORCINE) IN NACL 100-0.45 UNIT/ML-% IJ SOLN
1150.0000 [IU]/h | INTRAMUSCULAR | Status: DC
  Administered 2015-09-08: 1150 [IU]/h via INTRAVENOUS
  Filled 2015-09-08: qty 250

## 2015-09-08 MED ORDER — ACETAMINOPHEN 325 MG PO TABS: 650.0000 mg | ORAL_TABLET | ORAL | Status: DC | PRN

## 2015-09-08 MED ORDER — SODIUM CHLORIDE 0.9 % IJ SOLN
3.0000 mL | Freq: Two times a day (BID) | INTRAMUSCULAR | Status: DC
  Administered 2015-09-08: 3 mL via INTRAVENOUS

## 2015-09-08 MED ORDER — LEVOTHYROXINE SODIUM 75 MCG PO TABS
175.0000 ug | ORAL_TABLET | Freq: Every day | ORAL | Status: DC
  Administered 2015-09-09: 175 ug via ORAL
  Filled 2015-09-08: qty 1

## 2015-09-08 MED ORDER — HEPARIN (PORCINE) IN NACL 100-0.45 UNIT/ML-% IJ SOLN
1700.0000 [IU]/h | INTRAMUSCULAR | Status: DC
  Administered 2015-09-09 (×2): 1700 [IU]/h via INTRAVENOUS
  Filled 2015-09-08: qty 250

## 2015-09-08 MED ORDER — SODIUM CHLORIDE 0.9 % IJ SOLN: 3.0000 mL | INTRAMUSCULAR | Status: DC | PRN

## 2015-09-08 MED ORDER — HEPARIN BOLUS VIA INFUSION
2000.0000 [IU] | Freq: Once | INTRAVENOUS | Status: AC
  Administered 2015-09-08: 2000 [IU] via INTRAVENOUS
  Filled 2015-09-08: qty 2000

## 2015-09-08 MED ORDER — METOPROLOL TARTRATE 12.5 MG HALF TABLET
12.5000 mg | ORAL_TABLET | Freq: Two times a day (BID) | ORAL | Status: DC
  Administered 2015-09-08 – 2015-09-10 (×2): 12.5 mg via ORAL
  Filled 2015-09-08 (×2): qty 1

## 2015-09-08 MED ORDER — ALBUTEROL SULFATE HFA 108 (90 BASE) MCG/ACT IN AERS: 2.0000 | INHALATION_SPRAY | Freq: Four times a day (QID) | RESPIRATORY_TRACT | Status: DC | PRN

## 2015-09-08 MED ORDER — PANTOPRAZOLE SODIUM 40 MG PO TBEC: 40.0000 mg | DELAYED_RELEASE_TABLET | Freq: Every day | ORAL | Status: DC

## 2015-09-08 MED ORDER — ALBUTEROL SULFATE (2.5 MG/3ML) 0.083% IN NEBU: 3.0000 mL | INHALATION_SOLUTION | RESPIRATORY_TRACT | Status: DC | PRN

## 2015-09-08 MED ORDER — ASPIRIN 81 MG PO CHEW
81.0000 mg | CHEWABLE_TABLET | ORAL | Status: AC
  Administered 2015-09-09: 81 mg via ORAL
  Filled 2015-09-08: qty 1

## 2015-09-08 MED ORDER — ONDANSETRON HCL 4 MG/2ML IJ SOLN: 4.0000 mg | Freq: Four times a day (QID) | INTRAMUSCULAR | Status: DC | PRN

## 2015-09-08 MED ORDER — NITROGLYCERIN 0.4 MG SL SUBL: 0.4000 mg | SUBLINGUAL_TABLET | SUBLINGUAL | Status: DC | PRN

## 2015-09-08 MED ORDER — NITROGLYCERIN 2 % TD OINT
0.5000 [in_us] | TOPICAL_OINTMENT | Freq: Four times a day (QID) | TRANSDERMAL | Status: DC
  Filled 2015-09-08: qty 30

## 2015-09-08 NOTE — H&P (Signed)
Patient ID: James Huynh MRN: [id number], DOB/AGE: [birth year]   Admit date: 09/08/2015  Primary Physician: No PCP Per Patient Primary Cardiologist: previously Ival Bible, MD - [place]  Pt. Profile:  54 y/o male without a prior cardiac history who presents on transfer from [facility] following an episode of chest pain.  Problem List  Past Medical History  Diagnosis Date  . Hypothyroidism   . Asthma   . Chest pain     a. negative stress echo August 2011  . Tobacco abuse   . History of pneumonia     a. 2014.  [name] GERD (gastroesophageal reflux disease)   . High cholesterol   . Morbid obesity [facility])     Past Surgical History  Procedure Laterality Date  . Lymph gland removal    . Mandible surgery      broken jaw     Allergies  Allergies  Allergen Reactions  . Penicillins Anaphylaxis    HPI  54 year old male without a prior cardiac history. He does have a history of obesity, hyperlipidemia, and tobacco abuse. He had previously been evaluated for chest pain with negative stress test 2011. He has a known abnormal ECG with anterior ST segment depression and T-wave inversion. 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 He is usually able to perform without significant limitations. He was in his usual state of health until this morning, when he awoke to use the bathroom. He noted soreness to the back of his throat which was different than her usual sore throat. This discomfort moved into his chest and became associated with dyspnea, diaphoresis, and nausea. His girlfriend called EMS and he was instructed to take 4 baby aspirin as well as sublingual nitroglycerin, which he had at home. Symptoms lasted approximately 30 minutes in total duration and resolved by the time EMS had arrived. He was taken to [facility] where her ECG was notable for chronic anterior ST  depression and T-wave inversion. No acute changes were noted. His initial troponin was normal however follow-up troponin returned abnormal at 0.19. He was transferred to [facility] for further evaluation. He is currently chest pain-free.  Home Medications  Prior to Admission medications   Medication Sig Start Date End Date Taking? Authorizing Provider  albuterol (PROAIR HFA) 108 (90 BASE) MCG/ACT inhaler Inhale 2 puffs into the lungs every 6 (six) hours as needed for wheezing or shortness of breath.    Historical Provider, MD  albuterol (PROVENTIL) (2.5 MG/3ML) 0.083% nebulizer solution Take 2.5 mg by nebulization every 6 (six) hours as needed for wheezing or shortness of breath.     Historical Provider, MD  aspirin 81 MG tablet Take 81 mg by mouth every evening.     Historical Provider, MD  hydrochlorothiazide (HYDRODIURIL) 25 MG tablet Take 25 mg by mouth daily as needed (for swelling).     Historical Provider, MD  levothyroxine (SYNTHROID, LEVOTHROID) 175 MCG tablet Take 175 mcg by mouth daily.      Historical Provider, MD  lovastatin (MEVACOR) 20 MG tablet Take 20 mg by mouth at bedtime.    Historical Provider, MD  omeprazole (PRILOSEC) 20 MG capsule Take 20 mg by mouth daily.    Historical Provider, MD  sulfamethoxazole-trimethoprim (BACTRIM DS,SEPTRA DS) 800-160 MG per tablet Take 2 tablets by mouth 2 (two) times daily. Starting 05/08/13 x 10 days. 05/08/13   Historical Provider, MD    Family  History  Family History  Problem Relation Age of Onset  . Heart attack Father     history of MI in his 33's with bypass surgery - now alive @ 43.  . Lung cancer Mother     died in the 48's.    Social History  Social History   Social History  . Marital Status: Divorced    Spouse Name: N/A  . Number of Children: N/A  . Years of Education: N/A   Occupational History  . Not on file.   Social History Main Topics  . Smoking status: Current Every Day Smoker -- 1.00 packs/day for 25 years      Types: Cigarettes  . Smokeless tobacco: Never Used  . Alcohol Use: No  . Drug Use: 1.00 per week     Comment: occasionally smokes marijuana.  . Sexual Activity: Not on file   Other Topics Concern  . Not on file   Social History Narrative   Lives in [place] with his girlfriend and 56 year old father.  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     Review of Systems General:  No chills, fever, night sweats or weight changes.  Cardiovascular:  Positive chest pain, dyspnea, diaphoresis, and nausea. No edema, orthopnea, palpitations, paroxysmal nocturnal dyspnea. Dermatological: No rash, lesions/masses Respiratory: No cough, dyspnea Urologic: No hematuria, dysuria Abdominal:   No nausea, vomiting, diarrhea, bright red blood per rectum, melena, or hematemesis Neurologic:  No visual changes, wkns, changes in mental status. All other systems reviewed and are otherwise negative except as noted above.  Physical Exam  Blood pressure 112/69, temperature 98.3 F (36.8 C), temperature source Oral, resp. rate 16, height  (1.778 m), weight 221 lb (100.245 kg), SpO2 99 %.  General: Pleasant, NAD Psych: Normal affect. Neuro: Alert and oriented X 3. Moves all extremities spontaneously. HEENT: Normal  Neck: Supple without bruits or JVD. Lungs:  Resp regular and unlabored, diminished breath sounds bilaterally. Heart: RRR no s3, s4, or murmurs. Abdomen: Soft, non-tender, non-distended, BS + x 4.  Extremities: No clubbing, cyanosis or edema. DP/PT/Radials 2+ and equal bilaterally.  Labs - from [facility]  Na 137 K 3.4 Cl 96 CO2 26 BUN 14 Creat 1.17 Glucose 147 Calcium 9.1  total bilirubin 0.6 AST 23.5 ALT 31 alkaline phosphatase 54 Troponin 0.01 0.19 Albumin 4.0  WBC 8.9 Hemoglobin 15.3 Hematocrit 43.8 Platelets 273   Radiology/Studies  Chest x-ray at [facility]: No acute cardiopulmonary process.  ECG  Regular sinus  rhythm, 65, anterior ST depression with T-wave inversion-similar to ECGs in April 2013 and July 2014.   ASSESSMENT AND PLAN  1. Acute coronary syndrome/non-ST elevation MI: Patient presents after a 30 minute episode of nitrate responsive chest discomfort this morning. He has a chronically abnormal ECG with anterior ST depression T-wave inversion. No acute changes have been noted. His troponin was initially normal but subsequent troponin was abnormal at 0.19. He was transferred from [facility] for further evaluation. He is currently chest pain-free. I will add aspirin, heparin, high potency statin, and low-dose beta blocker.  We discussed pursuing diagnostic catheterization tomorrow or sooner if necessary for recurrent pain. The patient understands that risks include but are not limited to stroke (1 in 1000), death (1 in 1000), kidney failure [usually temporary] (1 in 500), bleeding (1 in 200), allergic reaction [possibly serious] (1 in 200), and agrees to proceed.   2. Hyperlipidemia: He is on low-dose statin therapy at home. We  will change this to high potency in the setting of acute coronary syndrome. Follow-up lipids. [facility].  3. Tobacco abuse: He had been smoking 1 pack per day. He says he quit as of last night. He will need ongoing encouragement and counseling.  4. Morbid obesity: He will benefit from seeing cardiac rehabilitation followed by outpatient cardiac rehabilitation.  5. Asthma: Continue home inhalers.   6. Hypothyroidism: Continue Synthroid. Check TSH.   Signed, Nicolasa Duckinghristopher Berge, NP 09/08/2015, 12:43 PM  The patient was seen and examined, and I agree with the assessment and plan as documented above. Discussed in detail with Ward Givenshris Berge, NP. Pt currently asymptomatic but symptoms earlier this morning were quite concerning. Chronically abnormal ECG as noted above with mild troponin elevation, consistent with NSTEMI. Has smoked 1 ppd x 13  years. Father had CABG in 6670's. Has hyperlipidemia. Will adjust medical therapy as noted above and plan for coronary angiography on 11/25. If he is found to have obstructive CAD and in need of PCI, he will follow up with me in our [place] office.  Prentice DockerSuresh Jiro Kiester, MD, [facility]  09/08/2015 1:01 PM

## 2015-09-08 NOTE — Progress Notes (Signed)
ANTICOAGULATION CONSULT NOTE - Initial Consult  Pharmacy Consult for heparin Indication: chest pain/ACS  Allergies  Allergen Reactions  . Penicillins Anaphylaxis    Patient Measurements: Height: 5\' 10"  (177.8 cm) Weight: 221 lb (100.245 kg) IBW/kg (Calculated) : 73 kg Heparin Dosing Weight: 94 kg  Vital Signs: Temp: 98.3 F (36.8 C) (11/24 1136) Temp Source: Oral (11/24 1136) BP: 112/69 mmHg (11/24 1136)  Labs: No results for input(s): HGB, HCT, PLT, APTT, LABPROT, INR, HEPARINUNFRC, CREATININE, CKTOTAL, CKMB, TROPONINI in the last 72 hours.  CrCl cannot be calculated (Patient has no serum creatinine result on file.).   Medical History: Past Medical History  Diagnosis Date  . Hypothyroidism   . Asthma   . Chest pain     a. negative stress echo August 2011  . Tobacco abuse   . History of pneumonia     a. 2014.  [name]. GERD (gastroesophageal reflux disease)   . High cholesterol   . Morbid obesity [facility](HCC)     Assessment: 54[facility].  EKG w/ anterior ST segment depression and T-wave inversion, initial trop normal but repeat trop mildly elevated to 0.19.  Pharmacy consulted to dose IV heparin for NSTEMI.  Baseline labs reviewed, CBC wnl.  Goal of Therapy:  Heparin level 0.3-0.7 units/ml Monitor platelets by anticoagulation protocol: Yes   Plan:  Heparin IV bolus 4000 units Heparin drip at 1150 units/hr Check 6hr heparin level Monitor daily HL, CBC, s/sx of bleeding Plan likely cath tomorrow 09/09/2015   Waynette Butteryegan K. Jerrold Haskell, PharmD, CPP Clinical Pharmacist Pager: [phone] 09/08/2015 1:57 PM

## 2015-09-08 NOTE — Progress Notes (Signed)
ANTICOAGULATION CONSULT NOTE - Follow Up  Pharmacy Consult for heparin Indication: chest pain/ACS  Allergies  Allergen Reactions  . Penicillins Anaphylaxis    Patient Measurements: Height: 5\' 10"  (177.8 cm) Weight: 221 lb (100.245 kg) IBW/kg (Calculated) : 73 kg Heparin Dosing Weight: 94 kg  Vital Signs: Temp: 97.6 F (36.4 C) (11/24 2057) Temp Source: Oral (11/24 2057) BP: 88/62 mmHg (11/24 2057) Pulse Rate: 74 (11/24 2057)  Labs:  Recent Labs  09/08/15 1359 09/08/15 2022  HEPARINUNFRC  --  <0.10*  TROPONINI 0.81* 0.80*   CrCl cannot be calculated (Patient has no serum creatinine result on file.).  Medical History: Past Medical History  Diagnosis Date  . Hypothyroidism   . Asthma   . Chest pain     a. negative stress echo August 2011  . Tobacco abuse   . History of pneumonia     a. 2014.  [name]. GERD (gastroesophageal reflux disease)   . High cholesterol   . Morbid obesity [facility](HCC)     Assessment: 54[facility].  EKG w/ anterior ST segment depression and T-wave inversion, initial trop normal but repeat trop mildly elevated to 0.19 and now up to 0.8.  He was started on IV heparin for NSTEMI and initial HL = < 0.1 on IV rate of 1150 units./hr.  Spoke with his nurse who confirms no issues regarding IV heparin or pump.  No bleeding identified.  Goal of Therapy:  Heparin level 0.3-0.7 units/ml Monitor platelets by anticoagulation protocol: Yes   Plan:  - Will repeat Heparin IV bolus with 2000 units x1 - Increase Heparin drip rate to 14000 units/hr - Check 8hr heparin level - Monitor daily HL, CBC, s/sx of bleeding - Plan likely cath tomorrow 09/09/2015  Nadara MustardNita Raphaela Cannaday, PharmD., MS Clinical Pharmacist Pager:  [phone] Thank you for allowing pharmacy to be part of this patients care team. 09/08/2015 9:49 PM

## 2015-09-08 NOTE — Progress Notes (Signed)
Patient request albuterol inhlaler instread of neb tx that is ordered prn. Dr. Jones BroomBensihmon called and order obtained [facility] Magnum Lunde RN

## 2015-09-08 NOTE — Progress Notes (Signed)
CRITICAL VALUE ALERT  Critical value received:  Troponin  Date of notification:  09/08/15  Time of notification:  1500  Critical value read back: yes  Nurse who received alert:  Vincent GrosKelsey Huber  MD notified (1st page):  Genevive Bihonda Barett,PA  Time of first page:  1530  MD notified (2nd page):  Time of second page:  Responding MD:  Cassie Freerhonda Barett, PA  Time MD responded:  [phone]

## 2015-09-09 ENCOUNTER — Encounter (HOSPITAL_COMMUNITY)
Admission: AD | Disposition: A | Discharge status: Home / Self Care | Payer: Self-pay | Source: Other Acute Inpatient Hospital | Attending: Cardiovascular Disease

## 2015-09-09 DIAGNOSIS — I2510 Atherosclerotic heart disease of native coronary artery without angina pectoris: Secondary | ICD-10-CM

## 2015-09-09 DIAGNOSIS — E039 Hypothyroidism, unspecified: Secondary | ICD-10-CM

## 2015-09-09 HISTORY — PX: CARDIAC CATHETERIZATION: SHX172

## 2015-09-09 LAB — BASIC METABOLIC PANEL
Anion gap: 8 (ref 5–15)
BUN: 13 mg/dL (ref 6–20)
CALCIUM: 8.8 mg/dL — AB (ref 8.9–10.3)
CO2: 29 mmol/L (ref 22–32)
CREATININE: 1.28 mg/dL — AB (ref 0.61–1.24)
Chloride: 101 mmol/L (ref 101–111)
GFR calc non Af Amer: >60 mL/min (ref >60)
GLUCOSE: 106 mg/dL — AB (ref 65–99)
Potassium: 3.3 mmol/L — ABNORMAL LOW (ref 3.5–5.1)
Sodium: 138 mmol/L (ref 135–145)

## 2015-09-09 LAB — LIPID PANEL
Cholesterol: 224 mg/dL — ABNORMAL HIGH (ref 0–200)
HDL: 24 mg/dL — ABNORMAL LOW (ref >40)
LDL CALC: 138 mg/dL — AB (ref 0–99)
Total CHOL/HDL Ratio: 9.3 RATIO
Triglycerides: 308 mg/dL — ABNORMAL HIGH (ref <150)
VLDL: 62 mg/dL — ABNORMAL HIGH (ref 0–40)

## 2015-09-09 LAB — HEMOGLOBIN A1C
HEMOGLOBIN A1C: 6 % — AB (ref 4.8–5.6)
MEAN PLASMA GLUCOSE: 126 mg/dL

## 2015-09-09 LAB — HEPARIN LEVEL (UNFRACTIONATED)
HEPARIN UNFRACTIONATED: 0.15 [IU]/mL — AB (ref 0.30–0.70)
HEPARIN UNFRACTIONATED: 0.43 [IU]/mL (ref 0.30–0.70)

## 2015-09-09 LAB — CBC
HCT: 40.3 % (ref 39.0–52.0)
HEMOGLOBIN: 13.8 g/dL (ref 13.0–17.0)
MCH: 30.9 pg (ref 26.0–34.0)
MCHC: 34.2 g/dL (ref 30.0–36.0)
MCV: 90.4 fL (ref 78.0–100.0)
Platelets: 258 10*3/uL (ref 150–400)
RBC: 4.46 MIL/uL (ref 4.22–5.81)
RDW: 13.2 % (ref 11.5–15.5)
WBC: 11 10*3/uL — ABNORMAL HIGH (ref 4.0–10.5)

## 2015-09-09 LAB — TROPONIN I: TROPONIN I: 0.61 ng/mL — AB (ref <0.031)

## 2015-09-09 LAB — POCT ACTIVATED CLOTTING TIME: ACTIVATED CLOTTING TIME: 466 s

## 2015-09-09 SURGERY — LEFT HEART CATH AND CORONARY ANGIOGRAPHY
Anesthesia: LOCAL

## 2015-09-09 MED ORDER — MIDAZOLAM HCL 2 MG/2ML IJ SOLN
INTRAMUSCULAR | Status: AC
  Filled 2015-09-09: qty 2

## 2015-09-09 MED ORDER — SODIUM CHLORIDE 0.9 % IV SOLN: 250.0000 mL | INTRAVENOUS | Status: DC | PRN

## 2015-09-09 MED ORDER — ACETAMINOPHEN 325 MG PO TABS
650.0000 mg | ORAL_TABLET | ORAL | Status: DC | PRN
  Administered 2015-09-09: 650 mg via ORAL
  Filled 2015-09-09: qty 2

## 2015-09-09 MED ORDER — TICAGRELOR 90 MG PO TABS
ORAL_TABLET | ORAL | Status: AC
  Filled 2015-09-09: qty 1

## 2015-09-09 MED ORDER — NITROGLYCERIN 1 MG/10 ML FOR IR/CATH LAB
INTRA_ARTERIAL | Status: AC
  Filled 2015-09-09: qty 10

## 2015-09-09 MED ORDER — POTASSIUM CHLORIDE CRYS ER 20 MEQ PO TBCR
40.0000 meq | EXTENDED_RELEASE_TABLET | Freq: Once | ORAL | Status: AC
  Administered 2015-09-09: 40 meq via ORAL
  Filled 2015-09-09: qty 2

## 2015-09-09 MED ORDER — BIVALIRUDIN 250 MG IV SOLR
INTRAVENOUS | Status: AC
  Filled 2015-09-09: qty 250

## 2015-09-09 MED ORDER — SODIUM CHLORIDE 0.9 % IJ SOLN: 3.0000 mL | INTRAMUSCULAR | Status: DC | PRN

## 2015-09-09 MED ORDER — ASPIRIN EC 81 MG PO TBEC
81.0000 mg | DELAYED_RELEASE_TABLET | Freq: Every day | ORAL | Status: DC
  Administered 2015-09-10: 81 mg via ORAL
  Filled 2015-09-09: qty 1

## 2015-09-09 MED ORDER — VERAPAMIL HCL 2.5 MG/ML IV SOLN
INTRA_ARTERIAL | Status: DC | PRN
  Administered 2015-09-09 (×2): 15 mL via INTRA_ARTERIAL

## 2015-09-09 MED ORDER — SODIUM CHLORIDE 0.9 % IV SOLN: INTRAVENOUS | Status: DC

## 2015-09-09 MED ORDER — ATORVASTATIN CALCIUM 80 MG PO TABS: 80.0000 mg | ORAL_TABLET | Freq: Every day | ORAL | Status: DC

## 2015-09-09 MED ORDER — BIVALIRUDIN BOLUS VIA INFUSION - CUPID
INTRAVENOUS | Status: DC | PRN
  Administered 2015-09-09: 75.6 mg via INTRAVENOUS

## 2015-09-09 MED ORDER — LEVOTHYROXINE SODIUM 75 MCG PO TABS
150.0000 ug | ORAL_TABLET | Freq: Every day | ORAL | Status: DC
  Administered 2015-09-10: 04:00:00 150 ug via ORAL
  Filled 2015-09-09: qty 2

## 2015-09-09 MED ORDER — TICAGRELOR 90 MG PO TABS
ORAL_TABLET | ORAL | Status: DC | PRN
  Administered 2015-09-09: 180 mg via ORAL

## 2015-09-09 MED ORDER — ANGIOPLASTY BOOK
Freq: Once | Status: AC
  Administered 2015-09-09: 20:00:00
  Filled 2015-09-09: qty 1

## 2015-09-09 MED ORDER — VERAPAMIL HCL 2.5 MG/ML IV SOLN
INTRAVENOUS | Status: AC
  Filled 2015-09-09: qty 2

## 2015-09-09 MED ORDER — HEART ATTACK BOUNCING BOOK
Freq: Once | Status: AC
  Administered 2015-09-09: 20:00:00
  Filled 2015-09-09: qty 1

## 2015-09-09 MED ORDER — SODIUM CHLORIDE 0.9 % IJ SOLN: 3.0000 mL | Freq: Two times a day (BID) | INTRAMUSCULAR | Status: DC

## 2015-09-09 MED ORDER — HEPARIN SODIUM (PORCINE) 1000 UNIT/ML IJ SOLN
INTRAMUSCULAR | Status: DC | PRN
  Administered 2015-09-09: 5000 [IU] via INTRAVENOUS

## 2015-09-09 MED ORDER — ONDANSETRON HCL 4 MG/2ML IJ SOLN: 4.0000 mg | Freq: Four times a day (QID) | INTRAMUSCULAR | Status: DC | PRN

## 2015-09-09 MED ORDER — HEPARIN (PORCINE) IN NACL 2-0.9 UNIT/ML-% IJ SOLN
INTRAMUSCULAR | Status: AC
  Filled 2015-09-09: qty 1500

## 2015-09-09 MED ORDER — TICAGRELOR 90 MG PO TABS
90.0000 mg | ORAL_TABLET | Freq: Two times a day (BID) | ORAL | Status: DC
  Administered 2015-09-10 (×2): 90 mg via ORAL
  Filled 2015-09-09 (×2): qty 1

## 2015-09-09 MED ORDER — HEPARIN SODIUM (PORCINE) 1000 UNIT/ML IJ SOLN
INTRAMUSCULAR | Status: AC
  Filled 2015-09-09: qty 1

## 2015-09-09 MED ORDER — SODIUM CHLORIDE 0.9 % IV SOLN
250.0000 mg | INTRAVENOUS | Status: DC | PRN
  Administered 2015-09-09: 1.75 mg/kg/h via INTRAVENOUS

## 2015-09-09 MED ORDER — IOHEXOL 350 MG/ML SOLN
INTRAVENOUS | Status: DC | PRN
  Administered 2015-09-09: 285 mL via INTRAVENOUS

## 2015-09-09 MED ORDER — MIDAZOLAM HCL 2 MG/2ML IJ SOLN
INTRAMUSCULAR | Status: DC | PRN
  Administered 2015-09-09 (×5): 1 mg via INTRAVENOUS

## 2015-09-09 MED ORDER — NITROGLYCERIN 1 MG/10 ML FOR IR/CATH LAB
INTRA_ARTERIAL | Status: DC | PRN
  Administered 2015-09-09: 18:00:00

## 2015-09-09 MED ORDER — LIDOCAINE HCL (PF) 1 % IJ SOLN
INTRAMUSCULAR | Status: AC
  Filled 2015-09-09: qty 30

## 2015-09-09 MED ORDER — FENTANYL CITRATE (PF) 100 MCG/2ML IJ SOLN
INTRAMUSCULAR | Status: DC | PRN
  Administered 2015-09-09: 25 ug via INTRAVENOUS
  Administered 2015-09-09 (×2): 50 ug via INTRAVENOUS
  Administered 2015-09-09: 25 ug via INTRAVENOUS

## 2015-09-09 MED ORDER — DIAZEPAM 5 MG PO TABS: 5.0000 mg | ORAL_TABLET | Freq: Four times a day (QID) | ORAL | Status: DC | PRN

## 2015-09-09 MED ORDER — FENTANYL CITRATE (PF) 100 MCG/2ML IJ SOLN
INTRAMUSCULAR | Status: AC
  Filled 2015-09-09: qty 2

## 2015-09-09 SURGICAL SUPPLY — 23 items
BALLN EUPHORA RX 2.5X15 (BALLOONS) ×3
BALLN ~~LOC~~ EMERGE MR 4.5X12 (BALLOONS) ×3
BALLN ~~LOC~~ TREK RX 3.75X12 (BALLOONS) ×3
BALLOON EUPHORA RX 2.5X15 (BALLOONS) ×2 IMPLANT
BALLOON ~~LOC~~ EMERGE MR 4.5X12 (BALLOONS) ×2 IMPLANT
BALLOON ~~LOC~~ TREK RX 3.75X12 (BALLOONS) ×2 IMPLANT
CATH INFINITI 5 FR JR5 (CATHETERS) ×3 IMPLANT
CATH INFINITI 5FR ANG PIGTAIL (CATHETERS) ×3 IMPLANT
CATH INFINITI JR4 5F (CATHETERS) ×3 IMPLANT
CATH OPTITORQUE TIG 4.0 5F (CATHETERS) ×3 IMPLANT
CATH SITESEER 5F NTR (CATHETERS) ×3 IMPLANT
CATH VISTA GUIDE 6FR XBLAD3.5 (CATHETERS) ×3 IMPLANT
DEVICE RAD COMP TR BAND LRG (VASCULAR PRODUCTS) ×3 IMPLANT
GLIDESHEATH SLEND A-KIT 6F 22G (SHEATH) ×3 IMPLANT
KIT ENCORE 26 ADVANTAGE (KITS) ×3 IMPLANT
KIT HEART LEFT (KITS) ×3 IMPLANT
PACK CARDIAC CATHETERIZATION (CUSTOM PROCEDURE TRAY) ×3 IMPLANT
STENT XIENCE ALPINE RX 3.5X18 (Permanent Stent) ×6 IMPLANT
SYR MEDRAD MARK V 150ML (SYRINGE) ×3 IMPLANT
TRANSDUCER W/STOPCOCK (MISCELLANEOUS) ×3 IMPLANT
TUBING CIL FLEX 10 FLL-RA (TUBING) ×3 IMPLANT
WIRE COUGAR XT STRL 190CM (WIRE) ×3 IMPLANT
WIRE SAFE-T 1.5MM-J .035X260CM (WIRE) ×3 IMPLANT

## 2015-09-09 NOTE — Progress Notes (Signed)
Patient awaiting cardiac catheterization today, pain free.  Education began re: MI causes and activity restrictions, angina symptoms, NTG usage, and risk factors.  Discussed phase II cardiac rehab after discharge, [facility].  Patient does not have insurance, CR may not be possible, but referred.  Patient is a 1 ppd smoker, but says he threw away his cigarettes yesterday.  His girlfriend smokes as well and says she is quitting as well.  Instructed to ask for help if his nicotine withdrawal is a problem.   [id number]

## 2015-09-09 NOTE — H&P (View-Only) (Signed)
   Patient Name: James Huynh Date of Encounter: 09/09/2015  Active Problems:   NSTEMI (non-ST elevated myocardial infarction) (HCC)   Acute coronary syndrome (HCC)    Primary Cardiologist: [place] - Dr. McDowell Patient Profile: 54 y/o male w/ PMH of HLD, tobacco abuse, and no prior cardiac history who presented on transfer from [facility] on 09/08/2015 following an episode of chest pain.  SUBJECTIVE: Denies any recurrent chest pain or shortness of breath. No questions or concerns about his procedure today.  OBJECTIVE Filed Vitals:   09/08/15 1136 09/08/15 2057 09/09/15 0559 09/09/15 0647  BP: 112/69 88/62 87/46 92/62  Pulse:  74 57 60  Temp: 98.3 F (36.8 C) 97.6 F (36.4 C) 98.1 F (36.7 C)   TempSrc: Oral Oral Oral   Resp: 16 18 16   Height:      Weight:   222 lb 3.2 oz (100.789 kg)   SpO2: 99% 98% 97%     Intake/Output Summary (Last 24 hours) at 09/09/15 0833 Last data filed at 09/09/15 0700  Gross per 24 hour  Intake 1428.1 ml  Output    420 ml  Net 1008.1 ml   Filed Weights   09/08/15 1133 09/09/15 0559  Weight: 221 lb (100.245 kg) 222 lb 3.2 oz (100.789 kg)    PHYSICAL EXAM General: Well developed, well nourished, male in no acute distress. Head: Normocephalic, atraumatic.  Neck: Supple without bruits, JVD not elevated. Lungs:  Resp regular and unlabored, CTA without wheezing or rales. Heart: RRR, S1, S2, no S3, S4, or murmur; no rub. Abdomen: Soft, non-tender, non-distended with normoactive bowel sounds. No hepatomegaly. No rebound/guarding. No obvious abdominal masses. Extremities: No clubbing, cyanosis, or edema. Distal pedal pulses are 2+ bilaterally. Neuro: Alert and oriented X 3. Moves all extremities spontaneously. Psych: Normal affect.   LABS: CBC: Recent Labs  09/09/15 0239  WBC 11.0*  HGB 13.8  HCT 40.3  MCV 90.4  PLT 258   Basic Metabolic Panel: Recent Labs  09/09/15 0239  NA 138  K 3.3*  CL 101  CO2 29  GLUCOSE  106*  BUN 13  CREATININE 1.28*  CALCIUM 8.8*   Cardiac Enzymes: Recent Labs  09/08/15 1359 09/08/15 2022 09/09/15 0215  TROPONINI 0.81* 0.80* 0.61*   Fasting Lipid Panel: Recent Labs  09/09/15 0239  CHOL 224*  HDL 24*  LDLCALC 138*  TRIG 308*  CHOLHDL 9.3   Thyroid Function Tests: Recent Labs  09/08/15 1359  TSH 0.205*   TELE:  NSR with rate in 50's - 70's.    ECG: NSR with rate in 60's. No acute changes since previous tracing.  Current Medications:  . aspirin  81 mg Oral QPM  . atorvastatin  80 mg Oral q1800  . levothyroxine  175 mcg Oral QAC breakfast  . metoprolol tartrate  12.5 mg Oral BID WC  . nitroGLYCERIN  0.5 inch Topical 4 times per day  . sodium chloride  3 mL Intravenous Q12H   . sodium chloride 1 mL/kg/hr (09/09/15 0652)  . heparin 1,700 Units/hr (09/09/15 0442)    ASSESSMENT AND PLAN:  1. NSTEMI:  - Patient presents after a 30 minute episode of nitrate responsive chest discomfort this morning.  - chronically abnormal ECG with anterior ST depression T-wave inversion.  - initial troponin was normal but subsequent troponin was abnormal at 0.19. He was transferred from [facility] for further evaluation. Cyclic troponin values have been 0.81, 0.80, and 0.61. - continue ASA, statin, BB, and   heparin. HR has been in the 50's at times and BP has been soft with systolic readings in high-80's to low-90's. Would not titrate BB or add ACE-I at this time. - Was not on board for cath. [facility] at [facility] 1400 with Dr. Tresa EndoKelly. Has been NPO since midnight.  2. HLD: - continue statin  3. Tobacco abuse:  - He had been smoking 1 pack per day.  - smoking cessation encouraged  4. Morbid obesity: - would likely benefit from cardiac rehab  5. Asthma:  - Continue home PRN inhalers.   6. Hypothyroidism:  - continue home Synthroid. TSH low at 0.205. - Consider decreasing home Synthroid at this time or having close follow-up with  PCP.  Signed, Ellsworth LennoxBrittany M Strader , PA-C 8:33 AM 09/09/2015 Pager: [phone] As above; no chest pain this AM; troponin abnormal; for cath today; risks and benefits discussed and patient agrees to proceed. Continue ASA, heparin, statin and metoprolol; discussed DCing tobacco; TSH low; decrease synthroid to 150 micrograms daily and fu primary care. Olga MillersBrian Crenshaw

## 2015-09-09 NOTE — Progress Notes (Signed)
Patient's BP low by automatic cuff last PM prior to my receiving report; dismissed as outlier given patient's clinical status. He has been asymptomatic since care was assumed by me at 23:00. However, BP is again abnormally low this AM and has been steadily dropping ever since [facility]. This is notable, especially given that nitroglycerin paste has been refused by patient since ordered. He has received no BP-affecting medications since metoprolol 12.5 mg PO was given at 15:39 yesterday.  At this time, he is still chest pain free and completely asymptomatic. He has ambulated to the bathroom to void without dizziness or lightheadedness. 12-lead EKG this AM shows complete resolution of mild ST-segment depressions noted in anterior[facility]lateral [facility].  Vital signs filed after BP re-checked manually: Filed Vitals:   09/08/15 1136 09/08/15 2057 09/09/15 0559 09/09/15 0647  BP: 112/69 88/62 87/46  92/62  Pulse:  74 57 60  Temp: 98.3 F (36.8 C) 97.6 F (36.4 C) 98.1 F (36.7 C)   TempSrc: Oral Oral Oral   Resp: 16 18 16    Height:      Weight:   100.789 kg (222 lb 3.2 oz)   SpO2: 99% 98% 97%    Metoprolol is not a home medication for this patient. He does report taking hydrochlorothiazide. Spoke with Fayrene FearingJames in pharmacy related to possible medication sensitivity. He recommends changing to low-dose carvedilol or other appropriate therapy if warranted.  Patient for cardiac catheterization today barring any contraindications.  Noted for MD follow-up on AM rounds. Will also communicate to oncoming shift for continuity of care.

## 2015-09-09 NOTE — Progress Notes (Signed)
ANTICOAGULATION CONSULT NOTE - Follow Up Consult  Pharmacy Consult for Heparin  Indication: NSTEMI  Allergies  Allergen Reactions  . Penicillins Anaphylaxis    Patient Measurements: Height: 5\' 10"  (177.8 cm) Weight: 222 lb 3.2 oz (100.789 kg) IBW/kg (Calculated) : 73   Vital Signs: Temp: 98.1 F (36.7 C) (11/25 0559) Temp Source: Oral (11/25 0559) BP: 92/62 mmHg (11/25 0647) Pulse Rate: 65 (11/25 1104)  Labs:  Recent Labs  09/08/15 1359 09/08/15 2022 09/09/15 0215 09/09/15 0239 09/09/15 1140  HGB  --   --   --  13.8  --   HCT  --   --   --  40.3  --   PLT  --   --   --  258  --   HEPARINUNFRC  --  <0.10*  --  0.15* 0.43  CREATININE  --   --   --  1.28*  --   TROPONINI 0.81* 0.80* 0.61*  --   --     Estimated Creatinine Clearance: 78.5 mL/min (by C-G formula based on Cr of 1.28).  Assessment: 54 y/o male on IV heparin for NSTEMI with plan for cath today. Heparin level is therapeutic at 0.43 on 1700 units/hr. No bleeding noted, CBC is normal.  Goal of Therapy:  Heparin level 0.3-0.7 units/ml Monitor platelets by anticoagulation protocol: Yes   Plan:  -Continue heparin drip at 1700 units/hr -Daily CBC/Heparin level -Monitor for s/sx bleeding  [facility] Medical CenterJennifer Muenster, Pharm.D., BCPS Clinical Pharmacist Pager: [phone] 09/09/2015 1:20 PM

## 2015-09-09 NOTE — Progress Notes (Signed)
Patient  Was admitted to  Room 6C02, alert and oriented VSS, T-band on  Left wrist and Angiomax as ordered until finish. Oriented to room  And equipment ,call bell within reach

## 2015-09-09 NOTE — Progress Notes (Signed)
Down to Cath Lab via pt bed. Belongings with s/o. Heparin gtt d/c'd on call at this time.

## 2015-09-09 NOTE — Care Management (Signed)
Utilization review completed. Ellen Mayol, RN Case Manager [phone]. 

## 2015-09-09 NOTE — Progress Notes (Signed)
ANTICOAGULATION CONSULT NOTE - Follow Up Consult  Pharmacy Consult for Heparin  Indication: chest pain/ACS  Allergies  Allergen Reactions  . Penicillins Anaphylaxis    Patient Measurements: Height: 5\' 10"  (177.8 cm) Weight: 221 lb (100.245 kg) IBW/kg (Calculated) : 73   Vital Signs: Temp: 97.6 F (36.4 C) (11/24 2057) Temp Source: Oral (11/24 2057) BP: 88/62 mmHg (11/24 2057) Pulse Rate: 74 (11/24 2057)  Labs:  Recent Labs  09/08/15 1359 09/08/15 2022 09/09/15 0215 09/09/15 0239  HGB  --   --   --  13.8  HCT  --   --   --  40.3  PLT  --   --   --  258  HEPARINUNFRC  --  <0.10*  --  0.15*  CREATININE  --   --   --  1.28*  TROPONINI 0.81* 0.80* 0.61*  --     Estimated Creatinine Clearance: 78.3 mL/min (by C-G formula based on Cr of 1.28).  Assessment: Sub-therapeutic heparin level despite rate increase, no issues per RN.   Goal of Therapy:  Heparin level 0.3-0.7 units/ml Monitor platelets by anticoagulation protocol: Yes   Plan:  -Increase heparin drip to 1700 units/hr -1200 HL -Daily CBC/HL -Monitor for bleeding  Abran DukeLedford, Anecia Nusbaum 09/09/2015,4:38 AM

## 2015-09-09 NOTE — Interval H&P Note (Signed)
Cath Lab Visit (complete for each Cath Lab visit)  Clinical Evaluation Leading to the Procedure:   ACS: Yes.    Non-ACS:    Anginal Classification: CCS IV  Anti-ischemic medical therapy: Maximal Therapy (2 or more classes of medications)  Non-Invasive Test Results: No non-invasive testing performed  Prior CABG: No previous CABG      History and Physical Interval Note:  09/09/2015 3:43 PM  James Huynh  has presented today for surgery, with the diagnosis of cp  The various methods of treatment have been discussed with the patient and family. After consideration of risks, benefits and other options for treatment, the patient has consented to  Procedure(s): Left Heart Cath and Coronary Angiography (N/A) as a surgical intervention .  The patient's history has been reviewed, patient examined, no change in status, stable for surgery.  I have reviewed the patient's chart and labs.  Questions were answered to the patient's satisfaction.     KELLY,THOMAS A

## 2015-09-09 NOTE — Progress Notes (Signed)
Patient Name: James Huynh Date of Encounter: 09/09/2015  Active Problems:   NSTEMI (non-ST elevated myocardial infarction) [facility])   Acute coronary syndrome [facility])    Primary Cardiologist: Jonita Albee - Dr. Diona Browner Patient Profile: 54 y/o male w/ PMH of HLD, tobacco abuse, and no prior cardiac history who presented on transfer from [facility] on 09/08/2015 following an episode of chest pain.  SUBJECTIVE: Denies any recurrent chest pain or shortness of breath. No questions or concerns about his procedure today.  OBJECTIVE Filed Vitals:   09/08/15 1136 09/08/15 2057 09/09/15 0559 09/09/15 0647  BP: 112/69 88/62 87/46  92/62  Pulse:  74 57 60  Temp: 98.3 F (36.8 C) 97.6 F (36.4 C) 98.1 F (36.7 C)   TempSrc: Oral Oral Oral   Resp: Height:      Weight:   222 lb 3.2 oz (100.789 kg)   SpO2: 99% 98% 97%     Intake/Output Summary (Last 24 hours) at 09/09/15 1610 Last data filed at 09/09/15 0700  Gross per 24 hour  Intake 1428.1 ml  Output    420 ml  Net 1008.1 ml   Filed Weights   09/08/15 1133 09/09/15 0559  Weight: 221 lb (100.245 kg) 222 lb 3.2 oz (100.789 kg)    PHYSICAL EXAM General: Well developed, well nourished, male in no acute distress. Head: Normocephalic, atraumatic.  Neck: Supple without bruits, JVD not elevated. Lungs:  Resp regular and unlabored, CTA without wheezing or rales. Heart: RRR, S1, S2, no S3, S4, or murmur; no rub. Abdomen: Soft, non-tender, non-distended with normoactive bowel sounds. No hepatomegaly. No rebound/guarding. No obvious abdominal masses. Extremities: No clubbing, cyanosis, or edema. Distal pedal pulses are 2+ bilaterally. Neuro: Alert and oriented X 3. Moves all extremities spontaneously. Psych: Normal affect.   LABS: CBC: Recent Labs  09/09/15 0239  WBC 11.0*  HGB 13.8  HCT 40.3  MCV 90.4  PLT 258   Basic Metabolic Panel: Recent Labs  09/09/15 0239  NA 138  K 3.3*  CL 101  CO2 29  GLUCOSE  106*  BUN 13  CREATININE 1.28*  CALCIUM 8.8*   Cardiac Enzymes: Recent Labs  09/08/15 1359 09/08/15 2022 09/09/15 0215  TROPONINI 0.81* 0.80* 0.61*   Fasting Lipid Panel: Recent Labs  09/09/15 0239  CHOL 224*  HDL 24*  LDLCALC 138*  TRIG 308*  CHOLHDL 9.3   Thyroid Function Tests: Recent Labs  09/08/15 1359  TSH 0.205*   TELE:  NSR with rate in 50's - 70's.    ECG: NSR with rate in 60's. No acute changes since previous tracing.  Current Medications:  . aspirin  81 mg Oral QPM  . atorvastatin  80 mg Oral q1800  . levothyroxine  175 mcg Oral QAC breakfast  . metoprolol tartrate  12.5 mg Oral BID WC  . nitroGLYCERIN  0.5 inch Topical 4 times per day  . sodium chloride  3 mL Intravenous Q12H   . sodium chloride 1 mL/kg/hr (09/09/15 9604)  . heparin 1,700 Units/hr (09/09/15 0442)    ASSESSMENT AND PLAN:  1. NSTEMI:  - Patient presents after a 30 minute episode of nitrate responsive chest discomfort this morning.  - chronically abnormal ECG with anterior ST depression T-wave inversion.  - initial troponin was normal but subsequent troponin was abnormal at 0.19. He was transferred from [facility] for further evaluation. Cyclic troponin values have been 0.81, 0.80, and 0.61. - continue ASA, statin, BB, and  heparin. HR has been in the 50's at times and BP has been soft with systolic readings in high-80's to low-90's. Would not titrate BB or add ACE-I at this time. - Was not on board for cath. Is now [facility] at approximately 1400 with Dr. Tresa EndoKelly. Has been NPO since midnight.  2. HLD: - continue statin  3. Tobacco abuse:  - He had been smoking 1 pack per day.  - smoking cessation encouraged  4. Morbid obesity: - would likely benefit from cardiac rehab  5. Asthma:  - Continue home PRN inhalers.   6. Hypothyroidism:  - continue home Synthroid. TSH low at 0.205. - Consider decreasing home Synthroid at this time or having close follow-up with  PCP.  Signed, Ellsworth LennoxBrittany M Strader , PA-C 8:33 AM 09/09/2015 Pager: [phone] As above; no chest pain this AM; troponin abnormal; for cath today; risks and benefits discussed and patient agrees to proceed. Continue ASA, heparin, statin and metoprolol; discussed DCing tobacco; TSH low; decrease synthroid to 150 micrograms daily and fu primary care. Olga MillersBrian Jia Dottavio

## 2015-09-10 ENCOUNTER — Encounter (HOSPITAL_COMMUNITY): Payer: Self-pay | Admitting: Physician Assistant

## 2015-09-10 DIAGNOSIS — Z720 Tobacco use: Secondary | ICD-10-CM | POA: Diagnosis present

## 2015-09-10 DIAGNOSIS — E039 Hypothyroidism, unspecified: Secondary | ICD-10-CM | POA: Diagnosis present

## 2015-09-10 DIAGNOSIS — E7800 Pure hypercholesterolemia, unspecified: Secondary | ICD-10-CM | POA: Diagnosis present

## 2015-09-10 LAB — BASIC METABOLIC PANEL
Anion gap: 6 (ref 5–15)
BUN: 13 mg/dL (ref 6–20)
CALCIUM: 7.9 mg/dL — AB (ref 8.9–10.3)
CO2: 23 mmol/L (ref 22–32)
CREATININE: 1.21 mg/dL (ref 0.61–1.24)
Chloride: 108 mmol/L (ref 101–111)
GFR calc Af Amer: >60 mL/min (ref >60)
GFR calc non Af Amer: >60 mL/min (ref >60)
GLUCOSE: 94 mg/dL (ref 65–99)
Potassium: 3.3 mmol/L — ABNORMAL LOW (ref 3.5–5.1)
SODIUM: 137 mmol/L (ref 135–145)

## 2015-09-10 LAB — CBC
HEMATOCRIT: 36.9 % — AB (ref 39.0–52.0)
HEMOGLOBIN: 12.8 g/dL — AB (ref 13.0–17.0)
MCH: 31.2 pg (ref 26.0–34.0)
MCHC: 34.7 g/dL (ref 30.0–36.0)
MCV: 90 fL (ref 78.0–100.0)
Platelets: 226 10*3/uL (ref 150–400)
RBC: 4.1 MIL/uL — AB (ref 4.22–5.81)
RDW: 13.3 % (ref 11.5–15.5)
WBC: 11.4 10*3/uL — ABNORMAL HIGH (ref 4.0–10.5)

## 2015-09-10 MED ORDER — TICAGRELOR 90 MG PO TABS: 90.0000 mg | ORAL_TABLET | Freq: Two times a day (BID) | ORAL | Status: DC

## 2015-09-10 MED ORDER — NITROGLYCERIN 0.4 MG SL SUBL: 0.4000 mg | SUBLINGUAL_TABLET | SUBLINGUAL | Status: DC | PRN

## 2015-09-10 MED ORDER — METOPROLOL TARTRATE 25 MG PO TABS: 12.5000 mg | ORAL_TABLET | Freq: Two times a day (BID) | ORAL | Status: DC

## 2015-09-10 MED ORDER — POTASSIUM CHLORIDE CRYS ER 20 MEQ PO TBCR
40.0000 meq | EXTENDED_RELEASE_TABLET | Freq: Once | ORAL | Status: AC
  Administered 2015-09-10: 09:00:00 40 meq via ORAL
  Filled 2015-09-10: qty 2

## 2015-09-10 MED ORDER — ALBUTEROL SULFATE HFA 108 (90 BASE) MCG/ACT IN AERS
2.0000 | INHALATION_SPRAY | RESPIRATORY_TRACT | Status: DC | PRN
  Administered 2015-09-10: 06:00:00 2 via RESPIRATORY_TRACT
  Filled 2015-09-10: qty 6.7

## 2015-09-10 MED ORDER — ATORVASTATIN CALCIUM 80 MG PO TABS: 80.0000 mg | ORAL_TABLET | Freq: Every day | ORAL | Status: DC

## 2015-09-10 NOTE — Discharge Summary (Signed)
Discharge Summary   Patient ID: James Huynh,  MRN: [id number], DOB/AGE: Jun 12, 1961 54 y.o.  Admit date: 09/08/2015 Discharge date: 09/10/2015  Primary Care Provider: No PCP Per Patient Primary Cardiologist: Dr. Purvis Sheffield, previously Ival Bible, MD - [facility]  Discharge Diagnoses Principal Problem:   NSTEMI (non-ST elevated myocardial infarction) [facility]) Active Problems:   Hypothyroidism   High cholesterol   Morbid obesity (HCC)   Tobacco abuse   Allergies Allergies  Allergen Reactions  . Penicillins Anaphylaxis    Procedures  Cardiac catheterization 09/09/2015 Conclusion     Prox RCA lesion, 20% stenosed.  Prox Cx to Mid Cx lesion, 80% stenosed. Post intervention, there is a 0% residual stenosis.  Mid LAD lesion, 80% stenosed. Post intervention, there is a 0% residual stenosis.  There is mild left ventricular systolic dysfunction.  Mild LV dysfunction with a global ejection fraction at 45-50% with mild mid anterolateral and mid inferior hypocontractility.  Significant 2 vessel CAD with 80% eccentric stenosis in the proximal LAD with initial TIMI 2 flow; normal ramus intermediate vessel; 80% hazy proximal left circumflex stenosis; and irregularity in the RCA with 20% proximal stenosis.  Successful two-vessel intervention with PTCA/DES stenting with a 3.518 mm Xience Alpine DES stent in the proximal left circumflex coronary artery, postdilated to 3.72 mm in the stenosis being reduced to 0%; and successful PCI to the proximal LAD with ultimate insertion of a 3.518 mm Xience Alpine DES stent postdilated to 4.2, tapering to 4.12 mm with initial TIMI 2 flow improving to TIMI-3 flow and the stenosis being reduced to 0%.  RECOMMENDATION: The patient will continue on dual antiplatelet therapy for minimum of a year. Aggressive lipid management, medical therapy for CAD, will be employed. Smoking sensation was strongly advised.      Hospital Course  The patient is a  54 year old male with past medical history of obesity, hyperlipidemia and tobacco abuse. He had a negative stress test back in 2011 for chest pain. No abnormal EKG with anterior ST segment depression and T-wave inversion. 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 He was in his usual state of health until he started having substernal chest discomfort on 09/08/2015. His girlfriend called EMS and he was instructed to take 4 baby aspirin as well as sublingual nitroglycerin. He was taken to [facility] and his EKG noted chronic anterior ST depression and T-wave inversion. Initial troponin was normal however follow-up troponin returned abnormal at 0.19. He was transferred to [facility] for further evaluation. She was ruled in for NSTEMI. After discussing various options with the patient, he agreed to undergo heart catheterization. He was admitted to cardiology service, overnight his troponin trended up to 0.81 before trending back down.  He underwent planned cardiac catheterization on 09/09/2015 which showed a 20% proximal RCA lesion, 80% prox to mid LCx treated with DES (3.518 mm Xience Alpine DES), 80% mid LAD lesion treated with 3.518 mm Xience Alpine DES stent postdilated to 4.2. Post cath, he was placed on aspirin and Brilinta. Lipid panel showed cholesterol 224, triglyceride 208, LDL 138, HDL 24. He was also treated with high-dose statin. He was seen on the following morning on 11/26, at which time he denies any significant chest discomfort or shortness breath. His right radial cath site appears to be stable with 2+ distal pulse. He is deemed stable for discharge from cardiology perspective. Given his lack of insurance, I have asked case manager to help with Brilinta assistance, I have filled  out the medication assistance form. I have also given him a 30 day prescription to go with free coupon along with one year additional prescription. We have discussed the  need for compliance with DAPT and tobacco cessation. He has been ambulating with cardiac rehabilitation without significant discomfort.  Metoprolol was added during this admission, we were unable to uptitrate her metoprolol or add ACE inhibitor due to his blood pressure in the high 80s to low 90s, he also has heart rate in the 50s most of the time with occasional dip in the high 40s.. Home HCTZ has been stopped. I have instructed him to monitor the blood pressure at home.  Discharge Vitals Blood pressure 97/65, pulse 69, temperature 97.9 F (36.6 C), temperature source Oral, resp. rate 18, height 5\' 10"  (1.778 m), weight 231 lb 0.7 oz (104.8 kg), SpO2 98 %.  Filed Weights   09/08/15 1133 09/09/15 0559 09/10/15 0353  Weight: 221 lb (100.245 kg) 222 lb 3.2 oz (100.789 kg) 231 lb 0.7 oz (104.8 kg)    Labs  CBC  Recent Labs  09/09/15 0239 09/10/15 0342  WBC 11.0* 11.4*  HGB 13.8 12.8*  HCT 40.3 36.9*  MCV 90.4 90.0  PLT 258 226   Basic Metabolic Panel  Recent Labs  09/09/15 0239 09/10/15 0342  NA 138 137  K 3.3* 3.3*  CL 101 108  CO2 29 23  GLUCOSE 106* 94  BUN 13 13  CREATININE 1.28* 1.21  CALCIUM 8.8* 7.9*   Cardiac Enzymes  Recent Labs  09/08/15 1359 09/08/15 2022 09/09/15 0215  TROPONINI 0.81* 0.80* 0.61*   Hemoglobin A1C  Recent Labs  09/08/15 1359  HGBA1C 6.0*   Fasting Lipid Panel  Recent Labs  09/09/15 0239  CHOL 224*  HDL 24*  LDLCALC 138*  TRIG 308*  CHOLHDL 9.3   Thyroid Function Tests  Recent Labs  09/08/15 1359  TSH 0.205*    Disposition  Pt is being discharged home today in good condition.  Follow-up Plans & Appointments      Follow-up Information    Follow up with Laqueta LindenKONESWARAN, SURESH A, MD.   Specialty:  Cardiology   Why:  Office will contact you to arrange followup, please give us a call if you do not hear from us in 2 business days.    Contact information:   [address] [place] [id number] [phone]        Discharge Medications    Medication List    STOP taking these medications        gemfibrozil 600 MG tablet  Commonly known as:  LOPID     hydrochlorothiazide 25 MG tablet  Commonly known as:  HYDRODIURIL     NITROGLYCERIN BU  Replaced by:  nitroGLYCERIN 0.4 MG SL tablet      TAKE these medications        albuterol (2.5 MG/3ML) 0.083% nebulizer solution  Commonly known as:  PROVENTIL  Take 2.5 mg by nebulization every 6 (six) hours as needed for wheezing or shortness of breath.     PROAIR HFA 108 (90 BASE) MCG/ACT inhaler  Generic drug:  albuterol  Inhale 2 puffs into the lungs every 6 (six) hours as needed for wheezing or shortness of breath.     aspirin 81 MG tablet  Take 81 mg by mouth every evening.     atorvastatin 80 MG tablet  Commonly known as:  LIPITOR  Take 1 tablet (80 mg total) by mouth daily at  6 PM.     levothyroxine 175 MCG tablet  Commonly known as:  SYNTHROID, LEVOTHROID  Take 175 mcg by mouth daily.     metoprolol tartrate 25 MG tablet  Commonly known as:  LOPRESSOR  Take 0.5 tablets (12.5 mg total) by mouth 2 (two) times daily with a meal.     nitroGLYCERIN 0.4 MG SL tablet  Commonly known as:  NITROSTAT  Place 1 tablet (0.4 mg total) under the tongue every 5 (five) minutes x 3 doses as needed for chest pain.     ticagrelor 90 MG Tabs tablet  Commonly known as:  BRILINTA  Take 1 tablet (90 mg total) by mouth 2 (two) times daily.        Duration of Discharge Encounter   Greater than 30 minutes including physician time.  Ramond Dial PA-C Pager: [phone] 09/10/2015, 8:59 AM

## 2015-09-10 NOTE — Discharge Instructions (Signed)
No driving for 24 hours. No lifting over 5 lbs for 1 week. No sexual activity for 1 week. You may return to work on 09/12/2015.  Keep procedure site clean & dry. If you notice increased pain, swelling, bleeding or pus, call/return!  You may shower, but no soaking baths/hot tubs/pools for 1 week.

## 2015-09-10 NOTE — Progress Notes (Signed)
Patient Name: James Huynh Date of Encounter: 09/10/2015  Primary Cardiologist: previously Ival Bible, MD - [facility]   Principal Problem:   NSTEMI (non-ST elevated myocardial infarction) [facility]) Active Problems:   Hypothyroidism   High cholesterol   Morbid obesity (HCC)   Tobacco abuse    SUBJECTIVE  Denies any CP or SOB. Wrist slightly sore after cath. Otherwise feeling great.   CURRENT MEDS . aspirin EC  81 mg Oral Daily  . atorvastatin  80 mg Oral q1800  . levothyroxine  150 mcg Oral QAC breakfast  . metoprolol tartrate  12.5 mg Oral BID WC  . nitroGLYCERIN  0.5 inch Topical 4 times per day  . sodium chloride  3 mL Intravenous Q12H  . ticagrelor  90 mg Oral BID    OBJECTIVE  Filed Vitals:   09/09/15 1900 09/09/15 2000 09/10/15 0353 09/10/15 0356  BP: 118/68 108/68 87/42 93/62   Pulse: 80 74 69   Temp: 97.8 F (36.6 C)  97.8 F (36.6 C)   TempSrc: Oral  Oral   Resp: Height:      Weight:   231 lb 0.7 oz (104.8 kg)   SpO2: 97% 99% 96% 97%    Intake/Output Summary (Last 24 hours) at 09/10/15 0747 Last data filed at 09/10/15 0616  Gross per 24 hour  Intake 1863.8 ml  Output   1900 ml  Net  -36.2 ml   Filed Weights   09/08/15 1133 09/09/15 0559 09/10/15 0353  Weight: 221 lb (100.245 kg) 222 lb 3.2 oz (100.789 kg) 231 lb 0.7 oz (104.8 kg)    PHYSICAL EXAM  General: Pleasant, NAD. Neuro: Alert and oriented X 3. Moves all extremities spontaneously. Psych: Normal affect. HEENT:  Normal  Neck: Supple without bruits or JVD. Lungs:  Resp regular and unlabored, CTA. Heart: RRR no s3, s4, or murmurs. 2+ R radial pulse distal to cath site. Abdomen: Soft, non-tender, non-distended, BS + x 4.  Extremities: No clubbing, cyanosis or edema. DP/PT/Radials 2+ and equal bilaterally.  Accessory Clinical Findings  CBC  Recent Labs  09/09/15 0239 09/10/15 0342  WBC 11.0* 11.4*  HGB 13.8 12.8*  HCT 40.3 36.9*  MCV 90.4 90.0  PLT 258 226   Basic  Metabolic Panel  Recent Labs  09/09/15 0239 09/10/15 0342  NA 138 137  K 3.3* 3.3*  CL 101 108  CO2 29 23  GLUCOSE 106* 94  BUN 13 13  CREATININE 1.28* 1.21  CALCIUM 8.8* 7.9*   Cardiac Enzymes  Recent Labs  09/08/15 1359 09/08/15 2022 09/09/15 0215  TROPONINI 0.81* 0.80* 0.61*   Hemoglobin A1C  Recent Labs  09/08/15 1359  HGBA1C 6.0*   Fasting Lipid Panel  Recent Labs  09/09/15 0239  CHOL 224*  HDL 24*  LDLCALC 138*  TRIG 308*  CHOLHDL 9.3   Thyroid Function Tests  Recent Labs  09/08/15 1359  TSH 0.205*    TELE Sinus brady with HR 40-50s    ECG  NSR without significant ST-T wave changes  Radiology/Studies  No results found.  ASSESSMENT AND PLAN  54 y/o male without a prior cardiac history who presents on transfer from [facility] following an episode of chest pain.  1. NSTEMI  - chronically abnormal ECG with anterior ST depression T-wave inversion. Trop 0.8  - cath 09/09/2015 20% prox RCA, 80% prox to mid LCx treated with DES (3.518 mm Xience Alpine DES), 80% mid LAD lesion treated with 3.518 mm Xience  Alpine DES stent postdilated to 4.2.  - ASA, lipitor, metoprolol, Brilinta. SBP 80-108. Will arrange 1 week TOC followup. Patient has no insurance, will discuss with case manager regarding Brilinta assistance.  2. HLD: chol 224, trig 308, HDL 24, LDL 138  3. Tobacco abuse  4. Morbid obesity  5. Hypothyroidism  Signed, Azalee CourseMeng, Hao PA-C Pager: [phone]   Patient seen and discussed with PA Lisabeth DevoidMeng, I agree with his documentation. Admit with NSTEMI, cath yesterday received DES to LCX and LAD. LVEF 45-50% by cath, he can have outpatient echo in next few weeks futher evaluate possible mild LV dysfunction. Continue med therapy with ASA, atorva 80, lopressor, brillinta. Soft bp's, cannot start ACE-I. We will plan for discharge today  Dominga FerryJ Khylei Wilms MD

## 2015-09-10 NOTE — Progress Notes (Addendum)
PHASE I Cardiac Rehab  Pt ready to go home.  Pt ambulating in hallway independently. Education reinforced. Pt given stent card, brilinta discount card with brilinta prescription.  Importance of brilinta compliance reinforced with pt.  Low fat diet, exercise guidelines and smoking cessation reinforced with patient. Pt states readiness to commit to smoking cessation.  Understanding verbalized

## 2015-09-12 ENCOUNTER — Encounter (HOSPITAL_COMMUNITY): Payer: Self-pay | Admitting: Cardiovascular Disease

## 2015-09-12 MED FILL — Verapamil HCl IV Soln 2.5 MG/ML: INTRAVENOUS | Qty: 2 | Status: AC

## 2015-09-14 ENCOUNTER — Ambulatory Visit (INDEPENDENT_AMBULATORY_CARE_PROVIDER_SITE_OTHER): Payer: Self-pay | Admitting: Cardiovascular Disease

## 2015-09-14 ENCOUNTER — Other Ambulatory Visit: Payer: Self-pay | Admitting: *Deleted

## 2015-09-14 ENCOUNTER — Encounter: Payer: Self-pay | Admitting: Cardiovascular Disease

## 2015-09-14 VITALS — BP 118/72 | HR 60 | Ht 70.0 in | Wt 230.0 lb

## 2015-09-14 DIAGNOSIS — Z720 Tobacco use: Secondary | ICD-10-CM

## 2015-09-14 DIAGNOSIS — E785 Hyperlipidemia, unspecified: Secondary | ICD-10-CM

## 2015-09-14 DIAGNOSIS — E7800 Pure hypercholesterolemia, unspecified: Secondary | ICD-10-CM

## 2015-09-14 DIAGNOSIS — I519 Heart disease, unspecified: Secondary | ICD-10-CM

## 2015-09-14 DIAGNOSIS — Z87898 Personal history of other specified conditions: Secondary | ICD-10-CM

## 2015-09-14 DIAGNOSIS — I214 Non-ST elevation (NSTEMI) myocardial infarction: Secondary | ICD-10-CM

## 2015-09-14 DIAGNOSIS — I25118 Atherosclerotic heart disease of native coronary artery with other forms of angina pectoris: Secondary | ICD-10-CM

## 2015-09-14 DIAGNOSIS — Z9289 Personal history of other medical treatment: Secondary | ICD-10-CM

## 2015-09-14 MED ORDER — TICAGRELOR 90 MG PO TABS: 90.0000 mg | ORAL_TABLET | Freq: Two times a day (BID) | ORAL | Status: DC

## 2015-09-14 NOTE — Patient Instructions (Signed)
Your physician recommends that you schedule a follow-up appointment in: 3 MONTHS WITH DR. Purvis SheffieldKONESWARAN  Your physician recommends that you continue on your current medications as directed. Please refer to the Current Medication list given to you today.  Your physician has requested that you have an echocardiogram IN 1 MONTH. Echocardiography is a painless test that uses sound waves to create images of your heart. It provides your doctor with information about the size and shape of your heart and how well your heart's chambers and valves are working. This procedure takes approximately one hour. There are no restrictions for this procedure.  Your physician recommends that you return for lab work in: 2 MONTHS LIPIDS- PLEASE FAST PRIOR TO LAB WORK  You have been referred to CARDIAC REHAB   Thank you for choosing [facility]!!

## 2015-09-14 NOTE — Progress Notes (Signed)
Patient ID: James Huynh, male   DOB: 07-May-1961, 54 y.o.   MRN: [id number]      SUBJECTIVE: James Huynh presents for a posthospitalization transition of care management appointment. [facility] for a non-STEMI. He underwent cardiac catheterization on 09/09/15 which showed an 80% proximal to mid left circumflex lesion as well as an 80% mid LAD lesion , for which he underwent drug-eluting stent placement to both vessels.  Left ventriculography demonstrated mildly reduced left ventricular systolic function, EF 45-50%, with ischemic wall motion abnormalities.  He had hypotension limiting the ability to increase metoprolol and ACE inhibitor during his hospitalization.  He is here with his fiance, who is a CNA.  He has been meticulously been monitoring his blood pressure, heart rate, and oxygen saturations, and they are all within normal limits. I reviewed all the data. He denies exertional chest pain and shortness of breath as well as leg swelling. He has no income but does have a one-month supply of Brilinta. He has had no bleeding problems.  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  Review of Systems: As per "subjective", otherwise negative.  Allergies  Allergen Reactions  . Penicillins Anaphylaxis    Current Outpatient Prescriptions  Medication Sig Dispense Refill  . albuterol (PROAIR HFA) 108 (90 BASE) MCG/ACT inhaler Inhale 2 puffs into the lungs every 6 (six) hours as needed for wheezing or shortness of breath.    [name]. albuterol (PROVENTIL) (2.5 MG/3ML) 0.083% nebulizer solution Take 2.5 mg by nebulization every 6 (six) hours as needed for wheezing or shortness of breath.     [name]. aspirin 81 MG tablet Take 81 mg by mouth every evening.     [name]. atorvastatin (LIPITOR) 80 MG tablet Take 1 tablet (80 mg total) by mouth daily at 6 PM. 30 tablet 11  . levothyroxine (SYNTHROID, LEVOTHROID) 175 MCG tablet Take 175 mcg by mouth daily.      . metoprolol  tartrate (LOPRESSOR) 25 MG tablet Take 0.5 tablets (12.5 mg total) by mouth 2 (two) times daily with a meal. 30 tablet 11  . nitroGLYCERIN (NITROSTAT) 0.4 MG SL tablet Place 1 tablet (0.4 mg total) under the tongue every 5 (five) minutes x 3 doses as needed for chest pain. 25 tablet 3  . ticagrelor (BRILINTA) 90 MG TABS tablet Take 1 tablet (90 mg total) by mouth 2 (two) times daily. 60 tablet 11   No current facility-administered medications for this visit.    Past Medical History  Diagnosis Date  . Hypothyroidism   . Asthma   . Chest pain     a. negative stress echo August 2011  . Tobacco abuse   . History of pneumonia     a. 2014.  [name]. GERD (gastroesophageal reflux disease)   . High cholesterol   . Morbid obesity (HCC)   . CAD (coronary artery disease)     cath 09/09/2015 20% prox RCA, 80% prox to mid LCx treated with DES (3.518 mm Xience Alpine DES), 80% mid LAD lesion treated with 3.518 mm Xience Alpine DES stent postdilated to 4.2.    Past Surgical History  Procedure Laterality Date  . Lymph gland removal    . Mandible surgery      broken jaw  . Cardiac catheterization N/A 09/09/2015    Procedure: Left Heart Cath and Coronary Angiography;  Surgeon: Lennette Biharihomas A Kelly, MD;  [facility];  Service: Cardiovascular;  Laterality: N/A;  . Cardiac catheterization  09/09/2015  Procedure: Coronary Stent Intervention;  Surgeon: Lennette Bihari, MD;  Location: [facility] INVASIVE CV LAB;  Service: Cardiovascular;;    Social History   Social History  . Marital Status: Divorced    Spouse Name: N/A  . Number of Children: N/A  . Years of Education: N/A   Occupational History  . Not on file.   Social History Main Topics  . Smoking status: Former Smoker -- 1.00 packs/day for 25 years    Types: Cigarettes    Start date: 08/06/1975    Quit date: 09/08/2015  . Smokeless tobacco: Never Used  . Alcohol Use: No  . Drug Use: 1.00 per week     Comment: occasionally smokes  marijuana.  . Sexual Activity: Not on file   Other Topics Concern  . Not on file   Social History Narrative   Lives in [place] with his girlfriend and 27 year old father.  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     Filed Vitals:   09/14/15 1023  BP: 118/72  Pulse: 60  Height:  (1.778 m)  Weight: 230 lb (104.327 kg)    PHYSICAL EXAM General: NAD HEENT: Normal. Neck: No JVD, no thyromegaly. Lungs: Clear to auscultation bilaterally with normal respiratory effort. CV: Nondisplaced PMI.  Regular rate and rhythm, normal S1/S2, no S3/S4, no murmur. No pretibial or periankle edema.  No carotid bruit.    Abdomen: Soft, nontender, obese.  Neurologic: Alert and oriented x 3.  Psych: Normal affect. Skin: Normal. Musculoskeletal: Normal range of motion, no gross deformities. Extremities: No clubbing or cyanosis.   ECG: Most recent ECG reviewed.      ASSESSMENT AND PLAN: 1. CAD with NSTEMI s/p PCI of LCx and LAD: Symptomatically stable. Continue therapy with aspirin, Lipitor, metoprolol, and Brilinta (will provide additional samples). Will obtain echocardiogram to assess cardiac structure and function in one month. Will make cardiac rehab referral.  2. Hyperlipidemia:  Lipid panel on 09/09/15 showed total cholesterol 224, triglycerides 308, HDL 24, LDL 138. Now on high intensity statin therapy. Lipids will need to be repeated in 2 months.  3. Tobacco abuse: Counseling previously provided.  4. Morbid obesity: Exercise encouraged. Referral to cardiac rehab.  Dispo: f/u 3 months.  Prentice Docker, M.D., F.A.C.C.

## 2015-10-03 ENCOUNTER — Telehealth: Payer: Self-pay | Admitting: Cardiovascular Disease

## 2015-10-03 DIAGNOSIS — R609 Edema, unspecified: Secondary | ICD-10-CM

## 2015-10-03 DIAGNOSIS — R0602 Shortness of breath: Secondary | ICD-10-CM

## 2015-10-03 MED ORDER — FUROSEMIDE 20 MG PO TABS: ORAL_TABLET | ORAL | Status: DC

## 2015-10-03 NOTE — Telephone Encounter (Signed)
Start Lasix 20 mg daily x 5 days for next week and then prn for SOB/leg swelling. Check BMET on Friday.

## 2015-10-03 NOTE — Telephone Encounter (Signed)
Patient notified.  [facility] [place] [facility]  Will print order & patient will [facility]

## 2015-10-03 NOTE — Telephone Encounter (Signed)
Was on HCTZ prior to heart attack.  Stated he was not put back on this upon hospital discharge.  10 pound weight gain has been gradually creeping up since last OV.  No c/o chest pain or dizziness.  Does c/o SOB at any time. Is worse with lying down.  Has used Albuterol inhaler up in less than 1 week, usually last 2-3 weeks.  Wheezing noted since yesterday.  No cough.  Was previously on HCTZ 25mg  daily - still has some at home from before.  Patient informed message sent to provider for further advice.

## 2015-10-03 NOTE — Telephone Encounter (Signed)
Feet are swelling.  Can not put on his cowboy boots and having a hard time breathing.  Has been having breathing issues over this past week and has gone through  A brand new inhaler.  10 lbs gained this week

## 2015-10-12 ENCOUNTER — Ambulatory Visit (INDEPENDENT_AMBULATORY_CARE_PROVIDER_SITE_OTHER): Payer: Self-pay

## 2015-10-12 ENCOUNTER — Telehealth: Payer: Self-pay | Admitting: *Deleted

## 2015-10-12 ENCOUNTER — Other Ambulatory Visit: Payer: Self-pay

## 2015-10-12 DIAGNOSIS — I214 Non-ST elevation (NSTEMI) myocardial infarction: Secondary | ICD-10-CM

## 2015-10-12 DIAGNOSIS — I519 Heart disease, unspecified: Secondary | ICD-10-CM

## 2015-10-12 NOTE — Telephone Encounter (Signed)
Notes Recorded by Lesle ChrisAngela G Kensy Blizard, LPN on 16/10/960412/28/2016 at 3:51 PM Patient notified. Copy to pmd.

## 2015-10-12 NOTE — Telephone Encounter (Signed)
-----   Message from Laqueta LindenSuresh A Koneswaran, MD sent at 10/12/2015  1:01 PM EST ----- Pumping function has normalized.

## 2015-12-13 ENCOUNTER — Encounter: Payer: Self-pay | Admitting: Cardiovascular Disease

## 2015-12-13 ENCOUNTER — Ambulatory Visit (INDEPENDENT_AMBULATORY_CARE_PROVIDER_SITE_OTHER): Payer: Self-pay | Admitting: Cardiovascular Disease

## 2015-12-13 VITALS — BP 106/70 | HR 75 | Ht 70.0 in | Wt 222.0 lb

## 2015-12-13 DIAGNOSIS — I252 Old myocardial infarction: Secondary | ICD-10-CM

## 2015-12-13 DIAGNOSIS — E7800 Pure hypercholesterolemia, unspecified: Secondary | ICD-10-CM

## 2015-12-13 DIAGNOSIS — E785 Hyperlipidemia, unspecified: Secondary | ICD-10-CM

## 2015-12-13 DIAGNOSIS — R0602 Shortness of breath: Secondary | ICD-10-CM

## 2015-12-13 DIAGNOSIS — I25118 Atherosclerotic heart disease of native coronary artery with other forms of angina pectoris: Secondary | ICD-10-CM

## 2015-12-13 DIAGNOSIS — Z720 Tobacco use: Secondary | ICD-10-CM

## 2015-12-13 MED ORDER — CLOPIDOGREL BISULFATE 75 MG PO TABS: 75.0000 mg | ORAL_TABLET | Freq: Every day | ORAL | Status: DC

## 2015-12-13 MED ORDER — TICAGRELOR 90 MG PO TABS: 90.0000 mg | ORAL_TABLET | Freq: Two times a day (BID) | ORAL | Status: DC

## 2015-12-13 NOTE — Patient Instructions (Addendum)
   Stop Brilinta.  Change to Plavix  daily - new sent to [facility] today. Continue all other medications.   Lab for Lipids - Reminder:  Nothing to eat or drink after 12 midnight prior to labs.  Order given today. Office will contact with results via phone or letter.   Follow up in  3 months

## 2015-12-13 NOTE — Progress Notes (Signed)
Patient ID: James Huynh, male   DOB: 1961/01/20, 55 y.o.   MRN: [id number]      SUBJECTIVE: James Huynh presents for routine follow up. I evaluated him at [facility] for a non-STEMI. He underwent cardiac catheterization on 09/09/15 which showed an 80% proximal to mid left circumflex lesion as well as an 80% mid LAD lesion , for which he underwent drug-eluting stent placement to both vessels.  Left ventriculography demonstrated mildly reduced left ventricular systolic function, EF 45-50%, with ischemic wall motion abnormalities.  He had hypotension limiting the ability to increase metoprolol and ACE inhibitor during his hospitalization.  He is here with his fiance, who is a CNA.  Follow-up echocardiogram on 10/12/15 demonstrated normal left ventricular systolic function, EF 55-60%, ischemic wall motion abnormalities, and mild LVH.   He felt well approximately one week after his myocardial infarction, but has had problems with significant exertional dyspnea with activities such as bringing the trash cans up the driveway as well as walking to the mailbox and back. He mentions he has significant asthma and used to see a pulmonologist in [place] but due to lack of insurance, he has not seen one in [place]. Denies chest pain.  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   Review of Systems: As per "subjective", otherwise negative.  Allergies  Allergen Reactions  . Penicillins Anaphylaxis    Current Outpatient Prescriptions  Medication Sig Dispense Refill  . albuterol (PROAIR HFA) 108 (90 BASE) MCG/ACT inhaler Inhale 2 puffs into the lungs every 6 (six) hours as needed for wheezing or shortness of breath.    [name] albuterol (PROVENTIL) (2.5 MG/3ML) 0.083% nebulizer solution Take 2.5 mg by nebulization every 6 (six) hours as needed for wheezing or shortness of breath.     [name] aspirin 81 MG tablet Take 81 mg by mouth every evening.     [name] atorvastatin (LIPITOR) 80  MG tablet Take 1 tablet (80 mg total) by mouth daily at 6 PM. 30 tablet 11  . furosemide (LASIX) 20 MG tablet Take one tab by mouth daily x 5 days only, then as needed thereafter for shortness of breath and / or leg swelling. 30 tablet 2  . levothyroxine (SYNTHROID, LEVOTHROID) 175 MCG tablet Take 175 mcg by mouth daily.      . metoprolol tartrate (LOPRESSOR) 25 MG tablet Take 0.5 tablets (12.5 mg total) by mouth 2 (two) times daily with a meal. 30 tablet 11  . nitroGLYCERIN (NITROSTAT) 0.4 MG SL tablet Place 1 tablet (0.4 mg total) under the tongue every 5 (five) minutes x 3 doses as needed for chest pain. 25 tablet 3  . ticagrelor (BRILINTA) 90 MG TABS tablet Take 1 tablet (90 mg total) by mouth 2 (two) times daily. 32 tablet 0   No current facility-administered medications for this visit.    Past Medical History  Diagnosis Date  . Hypothyroidism   . Asthma   . Chest pain     a. negative stress echo August 2011  . Tobacco abuse   . History of pneumonia     a. 2014.  [name] GERD (gastroesophageal reflux disease)   . High cholesterol   . Morbid obesity (HCC)   . CAD (coronary artery disease)     cath 09/09/2015 20% prox RCA, 80% prox to mid LCx treated with DES (3.518 mm Xience Alpine DES), 80% mid LAD lesion treated with 3.518 mm Xience Alpine DES stent postdilated to 4.2.    Past Surgical  History  Procedure Laterality Date  . Lymph gland removal    . Mandible surgery      broken jaw  . Cardiac catheterization N/A 09/09/2015    Procedure: Left Heart Cath and Coronary Angiography;  Surgeon: James Bihari, MD;  Location: [facility] INVASIVE CV LAB;  Service: Cardiovascular;  Laterality: N/A;  . Cardiac catheterization  09/09/2015    Procedure: Coronary Stent Intervention;  Surgeon: James Bihari, MD;  Location: [facility] INVASIVE CV LAB;  Service: Cardiovascular;;    Social History   Social History  . Marital Status: Divorced    Spouse Name: N/A  . Number of Children: N/A  . Years of  Education: N/A   Occupational History  . Not on file.   Social History Main Topics  . Smoking status: Former Smoker -- 1.00 packs/day for 25 years    Types: Cigarettes    Start date: 08/06/1975    Quit date: 09/08/2015  . Smokeless tobacco: Never Used  . Alcohol Use: No  . Drug Use: 1.00 per week     Comment: occasionally smokes marijuana.  . Sexual Activity: Not on file   Other Topics Concern  . Not on file   Social History Narrative   Lives in [place] with his girlfriend and 38 year old father.  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     Filed Vitals:   12/13/15 0954  BP: 106/70  Pulse: 75  Height:  (1.778 m)  Weight: 222 lb (100.699 kg)  SpO2: 98%    PHYSICAL EXAM General: NAD HEENT: Normal. Neck: No JVD, no thyromegaly. Lungs: Clear to auscultation bilaterally with normal respiratory effort. CV: Nondisplaced PMI. Regular rate and rhythm, normal S1/S2, no S3/S4, no murmur. No pretibial or periankle edema.   Abdomen: Soft, nontender, obese.  Neurologic: Alert and oriented x 3.  Psych: Normal affect. Skin: Normal. Musculoskeletal: Normal range of motion, no gross deformities. Extremities: No clubbing or cyanosis.   ECG: Most recent ECG reviewed.      ASSESSMENT AND PLAN: 1. Shortness of breath in context of CAD with NSTEMI s/p PCI of LCx and LAD: I wonder if symptoms are due to Brilinta. He has no insurance. I will continue therapy with aspirin, Lipitor, and metoprolol, but will stop Brilinta and switch to Plavix 75 mg daily.  2. Hyperlipidemia: Lipid panel on 09/09/15 showed total cholesterol 224, triglycerides 308, HDL 24, LDL 138. Now on high intensity statin therapy. Lipids will need to be repeated now.  3. Tobacco abuse: Counseling previously provided.  4. Morbid obesity: Exercise encouraged.   Dispo: f/u 3 months.   James Huynh, M.D., F.A.C.C.

## 2016-01-02 ENCOUNTER — Other Ambulatory Visit: Payer: Self-pay | Admitting: Cardiovascular Disease

## 2016-01-08 ENCOUNTER — Encounter: Payer: Self-pay | Admitting: Cardiovascular Disease

## 2016-01-12 ENCOUNTER — Encounter: Payer: Self-pay | Admitting: Cardiovascular Disease

## 2016-01-12 NOTE — Telephone Encounter (Signed)
Attempted to return call to patient.  Left message on voice mail.

## 2016-01-13 NOTE — Telephone Encounter (Signed)
Patient returned call this morning.  Discussed the Metoprolol with him.  Stated that he is willing to try the 1/2 tab in the evening.  He will call back with worsening symptoms or no change.

## 2016-03-14 ENCOUNTER — Ambulatory Visit: Payer: Self-pay | Admitting: Cardiovascular Disease

## 2016-03-15 ENCOUNTER — Telehealth: Payer: Self-pay

## 2016-03-15 NOTE — Telephone Encounter (Signed)
-----   Message from Laqueta LindenSuresh A Koneswaran, MD sent at 03/15/2016  3:50 PM EDT ----- TG mildly elevated. Needs dietary and exercise modification.

## 2016-03-15 NOTE — Telephone Encounter (Signed)
Called pt. No answer, lmtcb 

## 2016-04-09 ENCOUNTER — Ambulatory Visit (INDEPENDENT_AMBULATORY_CARE_PROVIDER_SITE_OTHER): Payer: Self-pay | Admitting: Cardiovascular Disease

## 2016-04-09 ENCOUNTER — Encounter: Payer: Self-pay | Admitting: Cardiovascular Disease

## 2016-04-09 VITALS — BP 119/75 | HR 73 | Ht 70.0 in | Wt 224.0 lb

## 2016-04-09 DIAGNOSIS — R0602 Shortness of breath: Secondary | ICD-10-CM

## 2016-04-09 DIAGNOSIS — I252 Old myocardial infarction: Secondary | ICD-10-CM

## 2016-04-09 DIAGNOSIS — E785 Hyperlipidemia, unspecified: Secondary | ICD-10-CM

## 2016-04-09 DIAGNOSIS — I25118 Atherosclerotic heart disease of native coronary artery with other forms of angina pectoris: Secondary | ICD-10-CM

## 2016-04-09 DIAGNOSIS — Z7182 Exercise counseling: Secondary | ICD-10-CM

## 2016-04-09 DIAGNOSIS — Z720 Tobacco use: Secondary | ICD-10-CM

## 2016-04-09 DIAGNOSIS — Z7189 Other specified counseling: Secondary | ICD-10-CM

## 2016-04-09 NOTE — Patient Instructions (Signed)

## 2016-04-09 NOTE — Progress Notes (Signed)
Patient ID: James Huynh, male   DOB: 03-24-1961, 55 y.o.   MRN: [id number]      SUBJECTIVE: [name] presents for routine follow up. I evaluated him at [facility] for a non-STEMI. He underwent cardiac catheterization on 09/09/15 which showed an 80% proximal to mid left circumflex lesion as well as an 80% mid LAD lesion , for which he underwent drug-eluting stent placement to both vessels.  Left ventriculography demonstrated mildly reduced left ventricular systolic function, EF 45-50%, with ischemic wall motion abnormalities.  He had hypotension limiting the ability to increase metoprolol and ACE inhibitor during his hospitalization.  He is here with his fiance who is a CNA.  Follow-up echocardiogram on 10/12/15 demonstrated normal left ventricular systolic function, EF 55-60%, ischemic wall motion abnormalities, and mild LVH.  Denies chest pain. He had shortness of breath at his last visit and I switched Brilinta to Plavix. He is feeling better now and hasn't improved appetite. He has been unable to exercise as he looks after his father and watches a lot of TV. He does know he needs to walk. He eats a lot of tunafish.  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   Review of Systems: As per "subjective", otherwise negative.  Allergies  Allergen Reactions  . Penicillins Anaphylaxis    Current Outpatient Prescriptions  Medication Sig Dispense Refill  . albuterol (PROAIR HFA) 108 (90 BASE) MCG/ACT inhaler Inhale 2 puffs into the lungs every 6 (six) hours as needed for wheezing or shortness of breath.    [name]. albuterol (PROVENTIL) (2.5 MG/3ML) 0.083% nebulizer solution Take 2.5 mg by nebulization every 6 (six) hours as needed for wheezing or shortness of breath.     [name]. aspirin 81 MG tablet Take 81 mg by mouth every evening.     [name]. atorvastatin (LIPITOR) 80 MG tablet Take 1 tablet (80 mg total) by mouth daily at 6 PM. 30 tablet 11  . clopidogrel (PLAVIX) 75 MG tablet  Take 1 tablet (75 mg total) by mouth daily. 30 tablet 6  . furosemide (LASIX) 20 MG tablet Take 20 mg by mouth daily.    [name]. levothyroxine (SYNTHROID, LEVOTHROID) 150 MCG tablet Take 150 mcg by mouth daily before breakfast.    . metoprolol tartrate (LOPRESSOR) 25 MG tablet Take 12.5 mg by mouth every evening.    . nitroGLYCERIN (NITROSTAT) 0.4 MG SL tablet Place 1 tablet (0.4 mg total) under the tongue every 5 (five) minutes x 3 doses as needed for chest pain. 25 tablet 3   No current facility-administered medications for this visit.    Past Medical History  Diagnosis Date  . Hypothyroidism   . Asthma   . Chest pain     a. negative stress echo August 2011  . Tobacco abuse   . History of pneumonia     a. 2014.  [name]. GERD (gastroesophageal reflux disease)   . High cholesterol   . Morbid obesity (HCC)   . CAD (coronary artery disease)     cath 09/09/2015 20% prox RCA, 80% prox to mid LCx treated with DES (3.518 mm Xience Alpine DES), 80% mid LAD lesion treated with 3.518 mm Xience Alpine DES stent postdilated to 4.2.    Past Surgical History  Procedure Laterality Date  . Lymph gland removal    . Mandible surgery      broken jaw  . Cardiac catheterization N/A 09/09/2015    Procedure: Left Heart Cath and Coronary Angiography;  Surgeon: Lennette Biharihomas A Kelly,  MD;  Location: [facility] INVASIVE CV LAB;  Service: Cardiovascular;  Laterality: N/A;  . Cardiac catheterization  09/09/2015    Procedure: Coronary Stent Intervention;  Surgeon: Lennette Biharihomas A Kelly, MD;  [facility];  Service: Cardiovascular;;    Social History   Social History  . Marital Status: Divorced    Spouse Name: N/A  . Number of Children: N/A  . Years of Education: N/A   Occupational History  . Not on file.   Social History Main Topics  . Smoking status: Former Smoker -- 1.00 packs/day for 25 years    Types: Cigarettes    Start date: 08/06/1975    Quit date: 09/08/2015  . Smokeless tobacco: Never Used  .  Alcohol Use: No  . Drug Use: 1.00 per week     Comment: occasionally smokes marijuana.  . Sexual Activity: Not on file   Other Topics Concern  . Not on file   Social History Narrative   Lives in [place] with his girlfriend and 55 year old father.  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     Filed Vitals:   04/09/16 1552  BP: 119/75  Pulse: 73  Height: 5\' 10"  (1.778 m)  Weight: 224 lb (101.606 kg)    PHYSICAL EXAM General: NAD HEENT: Normal. Neck: No JVD, no thyromegaly. Lungs: Clear to auscultation bilaterally with normal respiratory effort. CV: Nondisplaced PMI. Regular rate and rhythm, normal S1/S2, no S3/S4, no murmur. No pretibial or periankle edema.   Abdomen: Soft, nontender, obese.  Neurologic: Alert and oriented x 3.  Psych: Normal affect. Skin: Normal. Musculoskeletal: Normal range of motion, no gross deformities. Extremities: No clubbing or cyanosis.    ECG: Most recent ECG reviewed.      ASSESSMENT AND PLAN: 1. CAD with NSTEMI s/p PCI of LCx and LAD: Stable. I will continue therapy with aspirin, Lipitor, Plavix, and metoprolol.  2. Hyperlipidemia: Lipid panel on 03/13/16 showed mild TG elevation (242), total cholesterol 124, HDL 25, LDL 61. Continue statin. Dietary and exercise modification encouraged.  3. Tobacco abuse: Counseling previously provided.  4. Morbid obesity: Exercise encouraged.   Dispo: f/u 1 year.  Prentice DockerSuresh Koneswaran, M.D., F.A.C.C.

## 2016-05-14 ENCOUNTER — Encounter: Payer: Self-pay | Admitting: Cardiovascular Disease

## 2016-07-15 ENCOUNTER — Other Ambulatory Visit: Payer: Self-pay | Admitting: Cardiovascular Disease

## 2016-07-16 ENCOUNTER — Encounter: Payer: Self-pay | Admitting: Cardiovascular Disease

## 2016-08-12 ENCOUNTER — Other Ambulatory Visit: Payer: Self-pay | Admitting: Cardiovascular Disease

## 2016-08-12 ENCOUNTER — Encounter: Payer: Self-pay | Admitting: Cardiovascular Disease

## 2016-09-14 ENCOUNTER — Encounter: Payer: Self-pay | Admitting: Cardiovascular Disease

## 2016-09-14 ENCOUNTER — Other Ambulatory Visit: Payer: Self-pay | Admitting: Physician Assistant

## 2016-09-14 NOTE — Telephone Encounter (Signed)
Review for refill. 

## 2016-10-13 ENCOUNTER — Other Ambulatory Visit: Payer: Self-pay | Admitting: Physician Assistant

## 2016-10-16 NOTE — Telephone Encounter (Signed)
Review for refill. 

## 2017-01-09 ENCOUNTER — Encounter: Payer: Self-pay | Admitting: Cardiovascular Disease

## 2017-01-09 ENCOUNTER — Other Ambulatory Visit: Payer: Self-pay | Admitting: *Deleted

## 2017-01-09 MED ORDER — NITROGLYCERIN 0.4 MG SL SUBL: 0.4000 mg | SUBLINGUAL_TABLET | SUBLINGUAL | 3 refills | Status: DC | PRN

## 2017-02-13 ENCOUNTER — Encounter: Payer: Self-pay | Admitting: Cardiovascular Disease

## 2017-02-13 ENCOUNTER — Other Ambulatory Visit: Payer: Self-pay

## 2017-02-13 MED ORDER — CLOPIDOGREL BISULFATE 75 MG PO TABS: 75.0000 mg | ORAL_TABLET | Freq: Every day | ORAL | 3 refills | Status: DC

## 2017-02-25 ENCOUNTER — Encounter: Payer: Self-pay | Admitting: Cardiovascular Disease

## 2017-02-25 NOTE — Telephone Encounter (Signed)
Patient called - informed patient that after review of his chart I did not find any information relating to his blood type.  Stated that he would contact hospital in [place] to see if they may have on file.

## 2017-04-09 ENCOUNTER — Ambulatory Visit (INDEPENDENT_AMBULATORY_CARE_PROVIDER_SITE_OTHER): Payer: Self-pay | Admitting: Cardiovascular Disease

## 2017-04-09 ENCOUNTER — Encounter: Payer: Self-pay | Admitting: *Deleted

## 2017-04-09 ENCOUNTER — Encounter: Payer: Self-pay | Admitting: Cardiovascular Disease

## 2017-04-09 VITALS — BP 101/70 | HR 73 | Ht 70.0 in | Wt 219.0 lb

## 2017-04-09 DIAGNOSIS — E782 Mixed hyperlipidemia: Secondary | ICD-10-CM

## 2017-04-09 DIAGNOSIS — I214 Non-ST elevation (NSTEMI) myocardial infarction: Secondary | ICD-10-CM

## 2017-04-09 DIAGNOSIS — Z955 Presence of coronary angioplasty implant and graft: Secondary | ICD-10-CM

## 2017-04-09 DIAGNOSIS — I25118 Atherosclerotic heart disease of native coronary artery with other forms of angina pectoris: Secondary | ICD-10-CM

## 2017-04-09 DIAGNOSIS — I252 Old myocardial infarction: Secondary | ICD-10-CM

## 2017-04-09 NOTE — Patient Instructions (Signed)
Medication Instructions:   Stop Plavix.  Continue all other current medications.  Labwork: none  Testing/Procedures: none  Follow-Up: Your physician wants you to follow up in:  1 year.  You will receive a reminder letter in the mail one-two months in advance.  If you don't receive a letter, please call our office to schedule the follow up appointment   Any Other Special Instructions Will Be Listed Below (If Applicable).  If you need a refill on your cardiac medications before your next appointment, please call your pharmacy.

## 2017-04-09 NOTE — Progress Notes (Addendum)
SUBJECTIVE: James Huynh presents for routine follow up. I evaluated him at [facility] for a non-STEMI. He underwent cardiac catheterization on 09/09/15 which showed an 80% proximal to mid left circumflex lesion as well as an 80% mid LAD lesion , for which he underwent drug-eluting stent placement to both vessels.  Left ventriculography demonstrated mildly reduced left ventricular systolic function, EF 45-50%, with ischemic wall motion abnormalities.  Follow-up echocardiogram on 10/12/15 demonstrated normal left ventricular systolic function, EF 55-60%, ischemic wall motion abnormalities, and mild LVH.  His fiance is a CNA.  He is struggling as his father passed away 2 months ago of CHF.  He has not had to use any nitroglycerin. He denies chest pain. He has chronic exertional dyspnea which is stable. He quit smoking at the time of his MI.  ECG performed in the office today which I ordered and personally interpreted demonstrates normal sinus rhythm with no ischemic ST segment or T-wave abnormalities, nor any arrhythmias.   Soc: Stage managerrofessional wrestler, retired.    Review of Systems: As per "subjective", otherwise negative.  Allergies  Allergen Reactions  . Penicillins Anaphylaxis    Current Outpatient Prescriptions  Medication Sig Dispense Refill  . albuterol (PROAIR HFA) 108 (90 BASE) MCG/ACT inhaler Inhale 2 puffs into the lungs every 6 (six) hours as needed for wheezing or shortness of breath.    James Huynh. albuterol (PROVENTIL) (2.5 MG/3ML) 0.083% nebulizer solution Take 2.5 mg by nebulization every 6 (six) hours as needed for wheezing or shortness of breath.     James Huynh. aspirin 81 MG tablet Take 81 mg by mouth every evening.     James Huynh. atorvastatin (LIPITOR) 80 MG tablet TAKE ONE TABLET BY MOUTH ONCE DAILY AT  6PM 30 tablet 11  . clopidogrel (PLAVIX) 75 MG tablet Take 1 tablet (75 mg total) by mouth daily. 30 tablet 3  . furosemide (LASIX) 20 MG tablet Take 20 mg by mouth daily.    James Huynh.  levothyroxine (SYNTHROID, LEVOTHROID) 150 MCG tablet Take 150 mcg by mouth daily before breakfast.    . metoprolol tartrate (LOPRESSOR) 25 MG tablet Take 12.5 mg by mouth every evening.    . metoprolol tartrate (LOPRESSOR) 25 MG tablet TAKE ONE-HALF TABLET BY MOUTH TWICE DAILY WITH  A  MEAL 30 tablet 3  . nitroGLYCERIN (NITROSTAT) 0.4 MG SL tablet Place 1 tablet (0.4 mg total) under the tongue every 5 (five) minutes x 3 doses as needed for chest pain. 25 tablet 3   No current facility-administered medications for this visit.     Past Medical History:  Diagnosis Date  . Asthma   . CAD (coronary artery disease)    cath 09/09/2015 20% prox RCA, 80% prox to mid LCx treated with DES (3.518 mm Xience Alpine DES), 80% mid LAD lesion treated with 3.518 mm Xience Alpine DES stent postdilated to 4.2.  James Huynh. Chest pain    a. negative stress echo August 2011  . GERD (gastroesophageal reflux disease)   . High cholesterol   . History of pneumonia    a. 2014.  James Huynh. Hypothyroidism   . Morbid obesity (HCC)   . Tobacco abuse     Past Surgical History:  Procedure Laterality Date  . CARDIAC CATHETERIZATION N/A 09/09/2015   Procedure: Left Heart Cath and Coronary Angiography;  Surgeon: James Biharihomas A Kelly, MD;  [facility] LAB;  Service: Cardiovascular;  Laterality: N/A;  . CARDIAC CATHETERIZATION  09/09/2015   Procedure: Coronary Stent Intervention;  Surgeon:  James Bihari, MD;  Location: [facility] INVASIVE CV LAB;  Service: Cardiovascular;;  . lymph gland removal    . MANDIBLE SURGERY     broken jaw    Social History   Social History  . Marital status: Divorced    Spouse name: N/A  . Number of children: N/A  . Years of education: N/A   Occupational History  . Not on file.   Social History Main Topics  . Smoking status: Former Smoker    Packs/day: 1.00    Years: 25.00    Types: Cigarettes    Start date: 08/06/1975    Quit date: 09/08/2015  . Smokeless tobacco: Never Used  . Alcohol use  No  . Drug use: Yes    Frequency: 1.0 time per week     Comment: occasionally smokes marijuana.  . Sexual activity: Not on file   Other Topics Concern  . Not on file   Social History Narrative   Lives in [place] with his girlfriend and 40 year old father.  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     Vitals:   04/09/17 1307  BP: 101/70  Pulse: 73  SpO2: 96%  Weight: 219 lb (99.3 kg)  Height: 5\' 10"  (1.778 m)    Wt Readings from Last 3 Encounters:  04/09/17 219 lb (99.3 kg)  04/09/16 224 lb (101.6 kg)  12/13/15 222 lb (100.7 kg)     PHYSICAL EXAM General: NAD HEENT: Normal. Neck: No JVD, no thyromegaly. Lungs: Clear to auscultation bilaterally with normal respiratory effort. CV: Nondisplaced PMI.  Regular rate and rhythm, normal S1/S2, no S3/S4, no murmur. No pretibial or periankle edema.  No carotid bruit.   Abdomen: Soft, nontender, no distention.  Neurologic: Alert and oriented.  Psych: Normal affect. Skin: Normal. Musculoskeletal: No gross deformities.    ECG: Most recent ECG reviewed.   Labs: Lab Results  Component Value Date/Time   K 3.3 (L) 09/10/2015 03:42 AM   BUN 13 09/10/2015 03:42 AM   CREATININE 1.21 09/10/2015 03:42 AM   ALT 30 01/18/2013 01:07 PM   TSH 0.205 (L) 09/08/2015 01:59 PM   HGB 12.8 (L) 09/10/2015 03:42 AM     Lipids: Lab Results  Component Value Date/Time   LDLCALC 138 (H) 09/09/2015 02:39 AM   CHOL 224 (H) 09/09/2015 02:39 AM   TRIG 308 (H) 09/09/2015 02:39 AM   HDL 24 (L) 09/09/2015 02:39 AM       ASSESSMENT AND PLAN: 1. CAD with NSTEMI s/p PCI of LCx and LAD: Symptomatically stable. Continue aspirin, beta blocker, and Lipitor. I will discontinue Plavix.  2. Hyperlipidemia:Continue Lipitor. I will obtain a copy of lipids for review. He believes his triglycerides are elevated.  3. Tobacco abuse: In remission. Quit smoking at time of MI>  4. Morbid obesity: Has lost 5 lbs since  last year.     Disposition: Follow up 1 yr  Prentice Docker, M.D., F.A.C.C.

## 2017-07-23 ENCOUNTER — Other Ambulatory Visit: Payer: Self-pay | Admitting: Cardiovascular Disease

## 2017-07-24 ENCOUNTER — Other Ambulatory Visit: Payer: Self-pay | Admitting: Cardiovascular Disease

## 2017-09-15 ENCOUNTER — Other Ambulatory Visit: Payer: Self-pay | Admitting: Cardiovascular Disease

## 2017-10-21 ENCOUNTER — Other Ambulatory Visit: Payer: Self-pay | Admitting: Cardiovascular Disease

## 2018-01-20 ENCOUNTER — Other Ambulatory Visit: Payer: Self-pay | Admitting: Cardiovascular Disease

## 2018-01-30 ENCOUNTER — Other Ambulatory Visit: Payer: Self-pay | Admitting: Cardiovascular Disease

## 2018-01-30 MED ORDER — NITROGLYCERIN 0.4 MG SL SUBL: 0.4000 mg | SUBLINGUAL_TABLET | SUBLINGUAL | 3 refills | Status: DC | PRN

## 2018-01-30 NOTE — Addendum Note (Signed)
Addended by: Kerney ElbePINNIX, Mesha Schamberger G on: 01/30/2018 12:55 PM   Modules accepted: Orders

## 2018-01-30 NOTE — Telephone Encounter (Signed)
Refill complete 

## 2018-01-30 NOTE — Telephone Encounter (Signed)
° °  1. Which medications need to be refilled? (please list name of each medication and dose if known) nitroGLYCERIN (NITROSTAT) 0.4 MG    2. Which pharmacy/location (including street and city if local pharmacy) is medication to be sent to?  [facility] [place], [place]   3. Do they need a 30 day or 90 day supply?

## 2018-04-22 ENCOUNTER — Other Ambulatory Visit: Payer: Self-pay | Admitting: Cardiovascular Disease

## 2018-05-01 ENCOUNTER — Ambulatory Visit (INDEPENDENT_AMBULATORY_CARE_PROVIDER_SITE_OTHER): Payer: Self-pay | Admitting: Cardiovascular Disease

## 2018-05-01 ENCOUNTER — Encounter: Payer: Self-pay | Admitting: Cardiovascular Disease

## 2018-05-01 VITALS — BP 102/72 | HR 64 | Ht 70.0 in | Wt 231.0 lb

## 2018-05-01 DIAGNOSIS — E785 Hyperlipidemia, unspecified: Secondary | ICD-10-CM

## 2018-05-01 DIAGNOSIS — R0609 Other forms of dyspnea: Secondary | ICD-10-CM

## 2018-05-01 DIAGNOSIS — I25118 Atherosclerotic heart disease of native coronary artery with other forms of angina pectoris: Secondary | ICD-10-CM

## 2018-05-01 DIAGNOSIS — Z955 Presence of coronary angioplasty implant and graft: Secondary | ICD-10-CM

## 2018-05-01 DIAGNOSIS — I252 Old myocardial infarction: Secondary | ICD-10-CM

## 2018-05-01 NOTE — Patient Instructions (Signed)
Medication Instructions:  Continue all current medications.  Labwork:  Lipids - order given today.   Reminder:  Nothing to eat or drink after 12 midnight prior to labs.   Testing/Procedures: Your physician has recommended that you have a pulmonary function test. Pulmonary Function Tests are a group of tests that measure how well air moves in and out of your lungs.  Follow-Up: Your physician wants you to follow up in:  1 year.  You will receive a reminder letter in the mail one-two months in advance.  If you don't receive a letter, please call our office to schedule the follow up appointment   Any Other Special Instructions Will Be Listed Below (If Applicable).  If you need a refill on your cardiac medications before your next appointment, please call your pharmacy.

## 2018-05-01 NOTE — Progress Notes (Signed)
SUBJECTIVE: The patient presents for routine follow-up.  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  He underwent cardiac catheterization on 09/09/15 which showed an 80% proximal to mid left circumflex lesion as well as an 80% mid LAD lesion , for which he underwent drug-eluting stent placement to both vessels.  Left ventriculography demonstrated mildly reduced left ventricular systolic function, EF 45-50%, with ischemic wall motion abnormalities.  Follow-up echocardiogram on 10/12/15 demonstrated normal left ventricular systolic function, EF 55-60%, ischemic wall motion abnormalities, and mild LVH.  His fiance is a CNA.  ECG performed in the office today which I ordered and personally interpreted demonstrates normal sinus rhythm with no ischemic ST segment or T-wave abnormalities, nor any arrhythmias.  He denies chest pain and palpitations.  He quit smoking at the time of his MI.  He was diagnosed with asthma at the age of 57.  He has exertional dyspnea when taking his trash can to the end of the driveway.  He is fairly convinced he has COPD.  He is now working part-time about 15 hours a week at [employer].  He has had back issues, neck issues, and left shoulder issues.  He is thinking about applying for disability.   Soc Hx: Stage manager, retired.    Review of Systems: As per "subjective", otherwise negative.  Allergies  Allergen Reactions  . Penicillins Anaphylaxis    Current Outpatient Medications  Medication Sig Dispense Refill  . albuterol (PROAIR HFA) 108 (90 BASE) MCG/ACT inhaler Inhale 2 puffs into the lungs every 6 (six) hours as needed for wheezing or shortness of breath.    [name] albuterol (PROVENTIL) (2.5 MG/3ML) 0.083% nebulizer solution Take 2.5 mg by nebulization every 6 (six) hours as needed for wheezing or shortness of breath.     [name] aspirin 81 MG tablet Take 81 mg by mouth every evening.     [name] atorvastatin (LIPITOR) 80 MG tablet TAKE ONE  TABLET BY MOUTH ONCE DAILY SIX IN THE EVENING 30 tablet 11  . furosemide (LASIX) 20 MG tablet Take 20 mg by mouth daily.    [name] levothyroxine (SYNTHROID, LEVOTHROID) 137 MCG tablet Take 137 mcg by mouth daily before breakfast.    . metoprolol tartrate (LOPRESSOR) 25 MG tablet TAKE ONE-HALF TABLET BY MOUTH TWICE DAILY WITH FOOD 90 tablet 1  . nitroGLYCERIN (NITROSTAT) 0.4 MG SL tablet Place 1 tablet (0.4 mg total) under the tongue every 5 (five) minutes x 3 doses as needed for chest pain. 25 tablet 3   No current facility-administered medications for this visit.     Past Medical History:  Diagnosis Date  . Asthma   . CAD (coronary artery disease)    cath 09/09/2015 20% prox RCA, 80% prox to mid LCx treated with DES (3.518 mm Xience Alpine DES), 80% mid LAD lesion treated with 3.518 mm Xience Alpine DES stent postdilated to 4.2.  [name] Chest pain    a. negative stress echo August 2011  . GERD (gastroesophageal reflux disease)   . High cholesterol   . History of pneumonia    a. 2014.  [name] Hypothyroidism   . Morbid obesity (HCC)   . Tobacco abuse     Past Surgical History:  Procedure Laterality Date  . CARDIAC CATHETERIZATION N/A 09/09/2015   Procedure: Left Heart Cath and Coronary Angiography;  Surgeon: Lennette Bihari, MD;  Location: [facility] INVASIVE CV LAB;  Service: Cardiovascular;  Laterality: N/A;  . CARDIAC CATHETERIZATION  09/09/2015   Procedure:  Coronary Stent Intervention;  Surgeon: Lennette Bihari, MD;  Location: [facility] INVASIVE CV LAB;  Service: Cardiovascular;;  . lymph gland removal    . MANDIBLE SURGERY     broken jaw    Social History   Socioeconomic History  . Marital status: Divorced    Spouse name: Not on file  . Number of children: Not on file  . Years of education: Not on file  . Highest education level: Not on file  Occupational History  . Not on file  Social Needs  . Financial resource strain: Not on file  . Food insecurity:    Worry: Not on file    Inability: Not  on file  . Transportation needs:    Medical: Not on file    Non-medical: Not on file  Tobacco Use  . Smoking status: Former Smoker    Packs/day: 1.00    Years: 25.00    Pack years: 25.00    Types: Cigarettes    Start date: 08/06/1975    Last attempt to quit: 09/08/2015    Years since quitting: 2.6  . Smokeless tobacco: Never Used  Substance and Sexual Activity  . Alcohol use: No    Alcohol/week: 0.0 oz  . Drug use: Yes    Frequency: 1.0 times per week    Comment: occasionally smokes marijuana.  . Sexual activity: Not on file  Lifestyle  . Physical activity:    Days per week: Not on file    Minutes per session: Not on file  . Stress: Not on file  Relationships  . Social connections:    Talks on phone: Not on file    Gets together: Not on file    Attends religious service: Not on file    Active member of club or organization: Not on file    Attends meetings of clubs or organizations: Not on file    Relationship status: Not on file  . Intimate partner violence:    Fear of current or ex partner: Not on file    Emotionally abused: Not on file    Physically abused: Not on file    Forced sexual activity: Not on file  Other Topics Concern  . Not on file  Social History Narrative   Lives in [place] with his girlfriend and 31 year old father.  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     Vitals:   05/01/18 0819  BP: 102/72  Pulse: 64  SpO2: 95%  Weight: 231 lb (104.8 kg)  Height: 5\' 10"  (1.778 m)    Wt Readings from Last 3 Encounters:  05/01/18 231 lb (104.8 kg)  04/09/17 219 lb (99.3 kg)  04/09/16 224 lb (101.6 kg)     PHYSICAL EXAM General: NAD HEENT: Normal. Neck: No JVD, no thyromegaly. Lungs: Clear to auscultation bilaterally with normal respiratory effort. CV: Regular rate and rhythm, normal S1/S2, no S3/S4, no murmur. No pretibial or periankle edema.  No carotid bruit.   Abdomen: Soft, nontender, no distention.    Neurologic: Alert and oriented.  Psych: Normal affect. Skin: Normal. Musculoskeletal: No gross deformities.    ECG: Reviewed above under Subjective   Labs: Lab Results  Component Value Date/Time   K 3.3 (L) 09/10/2015 03:42 AM   BUN 13 09/10/2015 03:42 AM   CREATININE 1.21 09/10/2015 03:42 AM   ALT 30 01/18/2013 01:07 PM   TSH 0.205 (L) 09/08/2015 01:59 PM   HGB 12.8 (L) 09/10/2015 03:42 AM  Lipids: Lab Results  Component Value Date/Time   LDLCALC 138 (H) 09/09/2015 02:39 AM   CHOL 224 (H) 09/09/2015 02:39 AM   TRIG 308 (H) 09/09/2015 02:39 AM   HDL 24 (L) 09/09/2015 02:39 AM       ASSESSMENT AND PLAN:  1. CAD with NSTEMI s/p PCI of LCx and LAD: Symptomatically stable. Continue aspirin, beta blocker, and Lipitor.   2. Hyperlipidemia:Continue Lipitor 80 mg.  LDL 87 in October 2018.  I will obtain repeat lipids and if not at goal I may consider adding Zetia.  3. Morbid obesity: He is up 12 pounds since his last visit with me.  Back pain has limited his ability to exercise.  4.  Exertional dyspnea with history of tobacco use: I will obtain pulmonary function testing to see if he has COPD.  He currently uses inhalers provided by his friends.     Disposition: Follow up 1 yr   Prentice DockerSuresh Ashyr Hedgepath, M.D., F.A.C.C.

## 2018-05-08 ENCOUNTER — Ambulatory Visit (HOSPITAL_COMMUNITY)
Admission: RE | Admit: 2018-05-08 | Discharge: 2018-05-08 | Disposition: A | Discharge status: Home / Self Care | Payer: Self-pay | Source: Ambulatory Visit | Attending: Cardiovascular Disease | Admitting: Cardiovascular Disease

## 2018-05-08 DIAGNOSIS — R0609 Other forms of dyspnea: Secondary | ICD-10-CM | POA: Insufficient documentation

## 2018-05-08 LAB — PULMONARY FUNCTION TEST
DL/VA % pred: 76 %
DL/VA: 3.55 ml/min/mmHg/L
DLCO unc % pred: 67 %
DLCO unc: 21.84 ml/min/mmHg
FEF 25-75 Post: 1.33 L/sec
FEF 25-75 Pre: 1.08 L/sec
FEF2575-%CHANGE-POST: 22 %
FEF2575-%Pred-Post: 42 %
FEF2575-%Pred-Pre: 34 %
FEV1-%CHANGE-POST: 7 %
FEV1-%PRED-POST: 65 %
FEV1-%Pred-Pre: 61 %
FEV1-POST: 2.44 L
FEV1-PRE: 2.27 L
FEV1FVC-%CHANGE-POST: -2 %
FEV1FVC-%Pred-Pre: 78 %
FEV6-%Change-Post: 6 %
FEV6-%PRED-PRE: 77 %
FEV6-%Pred-Post: 82 %
FEV6-PRE: 3.63 L
FEV6-Post: 3.85 L
FEV6FVC-%Change-Post: -3 %
FEV6FVC-%PRED-PRE: 99 %
FEV6FVC-%Pred-Post: 95 %
FVC-%CHANGE-POST: 10 %
FVC-%PRED-PRE: 77 %
FVC-%Pred-Post: 86 %
FVC-POST: 4.19 L
FVC-PRE: 3.79 L
POST FEV6/FVC RATIO: 92 %
PRE FEV6/FVC RATIO: 96 %
Post FEV1/FVC ratio: 58 %
Pre FEV1/FVC ratio: 60 %
RV % PRED: 193 %
RV: 4.21 L
TLC % pred: 114 %
TLC: 8 L

## 2018-05-08 MED ORDER — ALBUTEROL SULFATE (2.5 MG/3ML) 0.083% IN NEBU
2.5000 mg | INHALATION_SOLUTION | Freq: Once | RESPIRATORY_TRACT | Status: AC
  Administered 2018-05-08: 2.5 mg via RESPIRATORY_TRACT

## 2018-05-14 ENCOUNTER — Encounter: Payer: Self-pay | Admitting: Cardiovascular Disease

## 2018-05-15 ENCOUNTER — Telehealth: Payer: Self-pay | Admitting: Cardiovascular Disease

## 2018-05-15 MED ORDER — TIOTROPIUM BROMIDE MONOHYDRATE 18 MCG IN CAPS: 18.0000 ug | ORAL_CAPSULE | Freq: Every day | RESPIRATORY_TRACT | 11 refills | Status: DC

## 2018-05-15 NOTE — Telephone Encounter (Signed)
Notes recorded by Lesle ChrisHill, Pollyann Roa G, LPN on 1/6/10968/10/2017 at 12:13 PM EDT Patient notified. Copy to pmd. [facility] ------  Notes recorded by Laqueta LindenKoneswaran, Suresh A, MD on 05/14/2018 at 4:41 PM EDT Results consistent with COPD. Start Spiriva (one inhalation daily)

## 2018-05-15 NOTE — Telephone Encounter (Signed)
Asking for test results

## 2018-05-26 ENCOUNTER — Telehealth: Payer: Self-pay | Admitting: *Deleted

## 2018-05-26 MED ORDER — ICOSAPENT ETHYL 1 G PO CAPS: 2.0000 | ORAL_CAPSULE | Freq: Two times a day (BID) | ORAL | 3 refills | Status: DC

## 2018-05-26 NOTE — Telephone Encounter (Signed)
Notes recorded by Lesle ChrisHill, Angela G, LPN on 1/61/09608/09/2018 at 3:37 PM EDT Patient notified and verbalized understanding. Copy to pmd. ------  Notes recorded by Lesle ChrisHill, Angela G, LPN on 4/5/40988/05/2018 at 2:43 PM EDT Left message to return call.  ------  Notes recorded by Laqueta LindenKoneswaran, Suresh A, MD on 05/21/2018 at 4:52 PM EDT Triglycerides are elevated. Start Vascepa 2 gm bid.

## 2018-05-26 NOTE — Telephone Encounter (Signed)
Samples and $9 co-pay card given to patient as he has no prescription coverage.

## 2018-07-10 ENCOUNTER — Other Ambulatory Visit: Payer: Self-pay | Admitting: Cardiovascular Disease

## 2018-08-13 ENCOUNTER — Other Ambulatory Visit: Payer: Self-pay | Admitting: Cardiovascular Disease

## 2018-08-14 MED ORDER — FUROSEMIDE 20 MG PO TABS: 20.0000 mg | ORAL_TABLET | Freq: Every day | ORAL | 11 refills | Status: DC

## 2018-08-14 NOTE — Telephone Encounter (Signed)
LASIX REFILLED

## 2018-09-19 ENCOUNTER — Other Ambulatory Visit: Payer: Self-pay | Admitting: Cardiovascular Disease

## 2018-11-19 ENCOUNTER — Encounter: Payer: Self-pay | Admitting: Cardiovascular Disease

## 2018-11-19 ENCOUNTER — Encounter: Payer: Self-pay | Admitting: *Deleted

## 2018-11-19 ENCOUNTER — Ambulatory Visit (INDEPENDENT_AMBULATORY_CARE_PROVIDER_SITE_OTHER): Payer: Self-pay | Admitting: Cardiovascular Disease

## 2018-11-19 VITALS — BP 102/66 | HR 73 | Ht 70.0 in | Wt 229.0 lb

## 2018-11-19 DIAGNOSIS — I209 Angina pectoris, unspecified: Secondary | ICD-10-CM

## 2018-11-19 DIAGNOSIS — J449 Chronic obstructive pulmonary disease, unspecified: Secondary | ICD-10-CM

## 2018-11-19 DIAGNOSIS — Z955 Presence of coronary angioplasty implant and graft: Secondary | ICD-10-CM

## 2018-11-19 DIAGNOSIS — E785 Hyperlipidemia, unspecified: Secondary | ICD-10-CM

## 2018-11-19 DIAGNOSIS — I25118 Atherosclerotic heart disease of native coronary artery with other forms of angina pectoris: Secondary | ICD-10-CM

## 2018-11-19 MED ORDER — ICOSAPENT ETHYL 1 G PO CAPS: 2.0000 g | ORAL_CAPSULE | Freq: Two times a day (BID) | ORAL | 0 refills | Status: DC

## 2018-11-19 MED ORDER — ISOSORBIDE DINITRATE 10 MG PO TABS: 10.0000 mg | ORAL_TABLET | Freq: Two times a day (BID) | ORAL | 6 refills | Status: DC

## 2018-11-19 NOTE — Patient Instructions (Signed)
Medication Instructions:   Begin Isordil 10mg  twice a day.  Continue all other medications.    Labwork: none  Testing/Procedures:  Your physician has requested that you have a lexiscan myoview. For further information please visit [email or web address] Please follow instruction sheet, as given.  Office will contact with results via phone or letter.    Follow-Up: 3 months   Any Other Special Instructions Will Be Listed Below (If Applicable).  If you need a refill on your cardiac medications before your next appointment, please call your pharmacy.

## 2018-11-19 NOTE — Progress Notes (Signed)
SUBJECTIVE: The patient presents for routine follow-up.  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  He underwent cardiac catheterization on 09/09/15 which showed an 80% proximal to mid left circumflex lesion as well as an 80% mid LAD lesion , for which he underwent drug-eluting stent placement to both vessels.  Left ventriculography demonstrated mildly reduced left ventricular systolic function, EF 45-50%, with ischemic wall motion abnormalities.  Follow-up echocardiogram on 10/12/15 demonstrated normal left ventricular systolic function, EF 55-60%, ischemic wall motion abnormalities, and mild LVH.  His fiance is a CNA and is present today.  He has multiple somatic complaints.  He has chronic back pain and is in the process of applying for Medicaid and disability.  He also has a history of pleurisy which he has spells of from time to time.  He had chest pains the other night and took 3 nitroglycerin tablets.  He has been experiencing left arm pain and numbness.  He said he can differentiate his cardiac pain from back pain and pleuritic pain.  His fiance said that he becomes irritable very quickly.  He is frustrated because he is not able to do the things he wants could do.  He could not afford Vascepa.  He has been getting Spiriva from a friend.    Soc LF:[id number] wrestler, retired.   Review of Systems: As per "subjective", otherwise negative.  Allergies  Allergen Reactions  . Penicillins Anaphylaxis    Current Outpatient Medications  Medication Sig Dispense Refill  . albuterol (PROAIR HFA) 108 (90 BASE) MCG/ACT inhaler Inhale 2 puffs into the lungs every 6 (six) hours as needed for wheezing or shortness of breath.    [name] albuterol (PROVENTIL) (2.5 MG/3ML) 0.083% nebulizer solution Take 2.5 mg by nebulization every 6 (six) hours as needed for wheezing or shortness of breath.     [name] aspirin 81 MG tablet Take 81 mg by mouth every evening.     [name] atorvastatin  (LIPITOR) 80 MG tablet TAKE 1 TABLET BY MOUTH ONCE DAILY 6 IN THE EVENING 90 tablet 2  . furosemide (LASIX) 20 MG tablet Take 1 tablet (20 mg total) by mouth daily. 30 tablet 11  . levothyroxine (SYNTHROID, LEVOTHROID) 137 MCG tablet Take 137 mcg by mouth daily before breakfast.    . metoprolol tartrate (LOPRESSOR) 25 MG tablet TAKE 1/2 (ONE-HALF) TABLET BY MOUTH TWICE DAILY WITH FOOD 90 tablet 1  . nitroGLYCERIN (NITROSTAT) 0.4 MG SL tablet Place 1 tablet (0.4 mg total) under the tongue every 5 (five) minutes x 3 doses as needed for chest pain. 25 tablet 3  . tiotropium (SPIRIVA HANDIHALER) 18 MCG inhalation capsule Place 1 capsule (18 mcg total) into inhaler and inhale daily. 30 capsule 11   No current facility-administered medications for this visit.     Past Medical History:  Diagnosis Date  . Asthma   . CAD (coronary artery disease)    cath 09/09/2015 20% prox RCA, 80% prox to mid LCx treated with DES (3.518 mm Xience Alpine DES), 80% mid LAD lesion treated with 3.518 mm Xience Alpine DES stent postdilated to 4.2.  [name] Chest pain    a. negative stress echo August 2011  . GERD (gastroesophageal reflux disease)   . High cholesterol   . History of pneumonia    a. 2014.  [name] Hypothyroidism   . Morbid obesity (HCC)   . Tobacco abuse     Past Surgical History:  Procedure Laterality Date  . CARDIAC CATHETERIZATION N/A 09/09/2015  Procedure: Left Heart Cath and Coronary Angiography;  Surgeon: Lennette Biharihomas A Kelly, MD;  Location: [facility] INVASIVE CV LAB;  Service: Cardiovascular;  Laterality: N/A;  . CARDIAC CATHETERIZATION  09/09/2015   Procedure: Coronary Stent Intervention;  Surgeon: Lennette Biharihomas A Kelly, MD;  Location: [facility] INVASIVE CV LAB;  Service: Cardiovascular;;  . lymph gland removal    . MANDIBLE SURGERY     broken jaw    Social History   Socioeconomic History  . Marital status: Divorced    Spouse name: Not on file  . Number of children: Not on file  . Years of education: Not on file    . Highest education level: Not on file  Occupational History  . Not on file  Social Needs  . Financial resource strain: Not on file  . Food insecurity:    Worry: Not on file    Inability: Not on file  . Transportation needs:    Medical: Not on file    Non-medical: Not on file  Tobacco Use  . Smoking status: Former Smoker    Packs/day: 1.00    Years: 25.00    Pack years: 25.00    Types: Cigarettes    Start date: 08/06/1975    Last attempt to quit: 09/08/2015    Years since quitting: 3.2  . Smokeless tobacco: Never Used  Substance and Sexual Activity  . Alcohol use: No    Alcohol/week: 0.0 standard drinks  . Drug use: Yes    Frequency: 1.0 times per week    Comment: occasionally smokes marijuana.  . Sexual activity: Not on file  Lifestyle  . Physical activity:    Days per week: Not on file    Minutes per session: Not on file  . Stress: Not on file  Relationships  . Social connections:    Talks on phone: Not on file    Gets together: Not on file    Attends religious service: Not on file    Active member of club or organization: Not on file    Attends meetings of clubs or organizations: Not on file    Relationship status: Not on file  . Intimate partner violence:    Fear of current or ex partner: Not on file    Emotionally abused: Not on file    Physically abused: Not on file    Forced sexual activity: Not on file  Other Topics Concern  . Not on file  Social History Narrative   [place] in [place] with his girlfriend and 58 year old father.  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     Vitals:   11/19/18 1016  BP: 102/66  Pulse: 73  SpO2: 98%  Weight: 229 lb (103.9 kg)  Height: 5\' 10"  (1.778 m)    Wt Readings from Last 3 Encounters:  11/19/18 229 lb (103.9 kg)  05/01/18 231 lb (104.8 kg)  04/09/17 219 lb (99.3 kg)     PHYSICAL EXAM General: NAD HEENT: Normal. Neck: No JVD, no thyromegaly. Lungs: Clear to  auscultation bilaterally with normal respiratory effort. CV: Regular rate and rhythm, normal S1/S2, no S3/S4, no murmur. No pretibial or periankle edema.     Abdomen: Soft, nontender, no distention.  Neurologic: Alert and oriented.  Psych: Normal affect. Skin: Normal. Musculoskeletal: No gross deformities.    ECG: Reviewed above under Subjective   Labs: Lab Results  Component Value Date/Time   K 3.3 (L) 09/10/2015 03:42 AM   BUN 13 09/10/2015 03:42  AM   CREATININE 1.21 09/10/2015 03:42 AM   ALT 30 01/18/2013 01:07 PM   TSH 0.205 (L) 09/08/2015 01:59 PM   HGB 12.8 (L) 09/10/2015 03:42 AM     Lipids: Lab Results  Component Value Date/Time   LDLCALC 138 (H) 09/09/2015 02:39 AM   CHOL 224 (H) 09/09/2015 02:39 AM   TRIG 308 (H) 09/09/2015 02:39 AM   HDL 24 (L) 09/09/2015 02:39 AM       ASSESSMENT AND PLAN:  1. CAD with NSTEMI s/p PCI of LCx and LAD:He has been experiencing more frequent anginal pains and taking more nitroglycerin.  I will start Isordil 10 mg twice daily.  I will also obtain a Lexiscan Myoview.  Continue aspirin, beta blocker, and Lipitor.   2. Hyperlipidemia:Continue Lipitor 80 mg.   LDL 77 and triglycerides 258 on 05/21/2018.  I prescribed Vascepa 2 g twice daily but he could not afford it.  He did not pick up the samples nor apply for the patient assistance program.  3. Morbid obesity:  His weight is stable.  Back pain has limited his ability to exercise.  4.  COPD: I previously obtained pulmonary function tests on 05/08/2018 with results consistent with COPD.  I started Spiriva.  He is borrowing this from a friend.   Disposition: Follow up 3 months   Prentice Docker, M.D., F.A.C.C.

## 2018-11-19 NOTE — Addendum Note (Signed)
Addended by: Lesle Chris on: 11/19/2018 11:20 AM   Modules accepted: Orders

## 2018-11-26 ENCOUNTER — Encounter (HOSPITAL_COMMUNITY): Payer: Self-pay

## 2019-02-18 ENCOUNTER — Telehealth: Payer: Self-pay | Admitting: Cardiovascular Disease

## 2019-02-18 NOTE — Telephone Encounter (Signed)
CONSENT OBTAINED ° ° °YOUR CARDIOLOGY TEAM HAS ARRANGED FOR AN E-VISIT FOR YOUR APPOINTMENT - PLEASE REVIEW IMPORTANT INFORMATION BELOW SEVERAL DAYS PRIOR TO YOUR APPOINTMENT ° °Due to the recent COVID-19 pandemic, we are transitioning in-person office visits to tele-medicine visits in an effort to decrease unnecessary exposure to our patients and staff. Medicare and most insurances are covering these visits without a copay needed. We also encourage you to sign up for MyChart if you have not already done so. You will need a smartphone if possible. For patients that do not have this, we can still complete the visit using a regular telephone but do prefer a smartphone to enable video when possible. You may have a close family member that lives with you that can help. If possible, we also ask that you have a blood pressure cuff and scale at home to measure your blood pressure, heart rate and weight prior to your scheduled appointment. Patients with clinical needs that need an in-person evaluation and testing will still be able to come to the office if absolutely necessary. If you have any questions, feel free to call our office. ° ° ° °IF YOU HAVE A SMARTPHONE, PLEASE DOWNLOAD THE WEBEX APP TO YOUR SMARTPHONE ° °- If Apple, go to App Store and type in WebEx in the search bar. Download [employer] Webex Meetings, the blue/green circle. The app is free but as with any other app download, your phone may require you to verify saved payment information or Apple password. You do NOT have to create a WebEx account. ° °- If Android, go to Google Play Store and type in WebEx in the search bar. Download [employer] Webex Meetings, the blue/green circle. The app is free but as with any other app download, your phone may require you to verify saved payment information or Android password. You do NOT have to create a WebEx account. ° °It is very helpful to have this downloaded before your visit. ° ° ° °2-3 DAYS BEFORE YOUR APPOINTMENT ° °You  will receive a telephone call from one of our [facility] team members - your caller ID may say "Unknown caller." If this is a video visit, we will confirm that you have been able to download the WebEx app. We will remind you check your blood pressure, heart rate and weight prior to your scheduled appointment. If you have an Apple Watch or Kardia, please upload any pertinent ECG strips the day before or morning of your appointment to MyChart. Our staff will also make sure you have reviewed the consent and agree to move forward with your scheduled tele-health visit.  ° ° ° °THE DAY OF YOUR APPOINTMENT ° °Approximately 15 minutes prior to your scheduled appointment, you will receive a telephone call from one of [facility] team - your caller ID may say "Unknown caller."  Our staff will confirm medications, vital signs for the day and any symptoms you may be experiencing. Please have this information available prior to the time of visit start. It may also be helpful for you to have a pad of paper and pen handy for any instructions given during your visit. They will also walk you through joining the WebEx smartphone meeting if this is a video visit. ° ° ° °CONSENT FOR TELE-HEALTH VISIT - PLEASE REVIEW ° °I hereby voluntarily request, consent and authorize [facility] and its employed or contracted physicians, physician assistants, nurse practitioners or other licensed health care professionals (the Practitioner), to provide me with telemedicine health care   services (the “Services") as deemed necessary by the treating Practitioner. I acknowledge and consent to receive the Services by the Practitioner via telemedicine. I understand that the telemedicine visit will involve communicating with the Practitioner through live audiovisual communication technology and the disclosure of certain medical information by electronic transmission. I acknowledge that I have been given the opportunity to request an in-person assessment or  other available alternative prior to the telemedicine visit and am voluntarily participating in the telemedicine visit. ° °I understand that I have the right to withhold or withdraw my consent to the use of telemedicine in the course of my care at any time, without affecting my right to future care or treatment, and that the Practitioner or I may terminate the telemedicine visit at any time. I understand that I have the right to inspect all information obtained and/or recorded in the course of the telemedicine visit and may receive copies of available information for a reasonable fee.  I understand that some of the potential risks of receiving the Services via telemedicine include:  °• Delay or interruption in medical evaluation due to technological equipment failure or disruption; °• Information transmitted may not be sufficient (e.g. poor resolution of images) to allow for appropriate medical decision making by the Practitioner; and/or  °• In rare instances, security protocols could fail, causing a breach of personal health information. ° °Furthermore, I acknowledge that it is my responsibility to provide information about my medical history, conditions and care that is complete and accurate to the best of my ability. I acknowledge that Practitioner's advice, recommendations, and/or decision may be based on factors not within their control, such as incomplete or inaccurate data provided by me or distortions of diagnostic images or specimens that may result from electronic transmissions. I understand that the practice of medicine is not an exact science and that Practitioner makes no warranties or guarantees regarding treatment outcomes. I acknowledge that I will receive a copy of this consent concurrently upon execution via email to the email address I last provided but may also request a printed copy by calling the office of [facility].   ° °I understand that my insurance will be billed for this visit.  ° °I  have read or had this consent read to me. °• I understand the contents of this consent, which adequately explains the benefits and risks of the Services being provided via telemedicine.  °• I have been provided ample opportunity to ask questions regarding this consent and the Services and have had my questions answered to my satisfaction. °• I give my informed consent for the services to be provided through the use of telemedicine in my medical care ° °By participating in this telemedicine visit I agree to the above. ° °

## 2019-02-19 ENCOUNTER — Encounter: Payer: Self-pay | Admitting: Cardiovascular Disease

## 2019-02-19 ENCOUNTER — Telehealth (INDEPENDENT_AMBULATORY_CARE_PROVIDER_SITE_OTHER): Payer: Self-pay | Admitting: Cardiovascular Disease

## 2019-02-19 VITALS — BP 106/76 | HR 66 | Ht 70.0 in | Wt 218.0 lb

## 2019-02-19 DIAGNOSIS — E785 Hyperlipidemia, unspecified: Secondary | ICD-10-CM

## 2019-02-19 DIAGNOSIS — I25118 Atherosclerotic heart disease of native coronary artery with other forms of angina pectoris: Secondary | ICD-10-CM

## 2019-02-19 DIAGNOSIS — Z955 Presence of coronary angioplasty implant and graft: Secondary | ICD-10-CM

## 2019-02-19 DIAGNOSIS — J449 Chronic obstructive pulmonary disease, unspecified: Secondary | ICD-10-CM

## 2019-02-19 NOTE — Progress Notes (Signed)
Virtual Visit via Video Note   This visit type was conducted due to national recommendations for restrictions regarding the COVID-19 Pandemic (e.g. social distancing) in an effort to limit this patient's exposure and mitigate transmission in our community.  Due to his co-morbid illnesses, this patient is at least at moderate risk for complications without adequate follow up.  This format is felt to be most appropriate for this patient at this time.  All issues noted in this document were discussed and addressed.  A limited physical exam was performed with this format.  Please refer to the patient's chart for his consent to telehealth for [facility].   Date:  02/19/2019   ID:  James Huynh, DOB [birth year], MRN [id number]  Patient Location: Home Provider Location: Office  PCP:  Vertis Kelch, NP  Cardiologist:  Prentice Docker, MD  Electrophysiologist:  None   Evaluation Performed:  Follow-Up Visit  Chief Complaint:  CAD  History of Present Illness:    James Huynh is a 58 y.o. male with CAD.   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  Due to anginal complaints, I started nitrates at his last visit and ordered a nuclear stress test which has not been completed yet.  He did not pursue a Lexiscan Myoview because he was afraid.  He said that his fiance's daughter has since moved out and his stress levels are completely gone as are his symptoms of chest pain.  Chronic exertional dyspnea due to COPD is stable.  He just returned from [place] this past weekend.  The patient does not have symptoms concerning for COVID-19 infection (fever, chills, cough, or new shortness of breath).   Soc Hx: His fiance is a Lawyer. Professional wrestler, retired.  Past Medical History:  Diagnosis Date  . Asthma   . CAD (coronary artery disease)    cath 09/09/2015 20% prox RCA, 80% prox to mid LCx treated with DES (3.518 mm Xience Alpine DES), 80% mid LAD lesion treated with  3.518 mm Xience Alpine DES stent postdilated to 4.2.  [name] Chest pain    a. negative stress echo August 2011  . GERD (gastroesophageal reflux disease)   . High cholesterol   . History of pneumonia    a. 2014.  [name] Hypothyroidism   . Morbid obesity (HCC)   . Tobacco abuse    Past Surgical History:  Procedure Laterality Date  . CARDIAC CATHETERIZATION N/A 09/09/2015   Procedure: Left Heart Cath and Coronary Angiography;  Surgeon: Lennette Bihari, MD;  Location: [facility] INVASIVE CV LAB;  Service: Cardiovascular;  Laterality: N/A;  . CARDIAC CATHETERIZATION  09/09/2015   Procedure: Coronary Stent Intervention;  Surgeon: Lennette Bihari, MD;  Location: [facility] INVASIVE CV LAB;  Service: Cardiovascular;;  . lymph gland removal    . MANDIBLE SURGERY     broken jaw     Current Meds  Medication Sig  . albuterol (PROAIR HFA) 108 (90 BASE) MCG/ACT inhaler Inhale 2 puffs into the lungs every 6 (six) hours as needed for wheezing or shortness of breath.  [name] albuterol (PROVENTIL) (2.5 MG/3ML) 0.083% nebulizer solution Take 2.5 mg by nebulization every 6 (six) hours as needed for wheezing or shortness of breath.   [name] aspirin 81 MG tablet Take 81 mg by mouth every evening.   [name] atorvastatin (LIPITOR) 80 MG tablet TAKE 1 TABLET BY MOUTH ONCE DAILY 6 IN THE EVENING  . furosemide (LASIX) 20 MG tablet Take 1 tablet (20 mg total) by  mouth daily.  Bess Harvest Ethyl (VASCEPA) 1 g CAPS Take 2 capsules (2 g total) by mouth 2 (two) times daily.  . isosorbide dinitrate (ISORDIL) 10 MG tablet Take 1 tablet (10 mg total) by mouth 2 (two) times daily.  [name] levothyroxine (SYNTHROID, LEVOTHROID) 137 MCG tablet Take 137 mcg by mouth daily before breakfast.  . metoprolol tartrate (LOPRESSOR) 25 MG tablet TAKE 1/2 (ONE-HALF) TABLET BY MOUTH TWICE DAILY WITH FOOD  . nitroGLYCERIN (NITROSTAT) 0.4 MG SL tablet Place 1 tablet (0.4 mg total) under the tongue every 5 (five) minutes x 3 doses as needed for chest pain.  [name] tiotropium (SPIRIVA  HANDIHALER) 18 MCG inhalation capsule Place 1 capsule (18 mcg total) into inhaler and inhale daily.     Allergies:   Penicillins   Social History   Tobacco Use  . Smoking status: Former Smoker    Packs/day: 1.00    Years: 25.00    Pack years: 25.00    Types: Cigarettes    Start date: 08/06/1975    Last attempt to quit: 09/08/2015    Years since quitting: 3.4  . Smokeless tobacco: Never Used  Substance Use Topics  . Alcohol use: No    Alcohol/week: 0.0 standard drinks  . Drug use: Yes    Frequency: 1.0 times per week    Comment: occasionally smokes marijuana.     Family Hx: The patient's family history includes Heart attack in his father; Lung cancer in his mother.  ROS:   Please see the history of present illness.     All other systems reviewed and are negative.   Prior CV studies:   The following studies were reviewed today:  He underwent cardiac catheterization on 09/09/15 which showed an 80% proximal to mid left circumflex lesion as well as an 80% mid LAD lesion , for which he underwent drug-eluting stent placement to both vessels.  Left ventriculography demonstrated mildly reduced left ventricular systolic function, EF 45-50%, with ischemic wall motion abnormalities.  Follow-up echocardiogram on 10/12/15 demonstrated normal left ventricular systolic function, EF 55-60%, ischemic wall motion abnormalities, and mild LVH.  Labs/Other Tests and Data Reviewed:    EKG:  No ECG reviewed.  Recent Labs: No results found for requested labs within last 8760 hours.   Recent Lipid Panel Lab Results  Component Value Date/Time   CHOL 224 (H) 09/09/2015 02:39 AM   TRIG 308 (H) 09/09/2015 02:39 AM   HDL 24 (L) 09/09/2015 02:39 AM   CHOLHDL 9.3 09/09/2015 02:39 AM   LDLCALC 138 (H) 09/09/2015 02:39 AM    Wt Readings from Last 3 Encounters:  02/19/19 218 lb (98.9 kg)  11/19/18 229 lb (103.9 kg)  05/01/18 231 lb (104.8 kg)     Objective:    Vital Signs:  BP 106/76    Pulse 66   Ht  (1.778 m)   Wt 218 lb (98.9 kg)   SpO2 97%   BMI 31.28 kg/m    VITAL SIGNS:  reviewed GEN:  no acute distress EYES:  sclerae anicteric, EOMI - Extraocular Movements Intact RESPIRATORY:  normal respiratory effort, symmetric expansion MUSCULOSKELETAL:  no obvious deformities. NEURO:  alert and oriented x 3, no obvious focal deficit PSYCH:  normal affect  ASSESSMENT & PLAN:    1. CAD with NSTEMI s/p PCI of LCx and ZOX:[id number] improved with decrease of life stressors.  Continue Isordil 10 mg twice daily.  I previously ordered a nuclear stress test but he did not pursue it because he  was afraid.  Continue aspirin, beta blocker, and Lipitor.   2. Hyperlipidemia:Continue Lipitor80 mg.  LDL 77 and triglycerides 258 on 05/21/2018.  I prescribed Vascepa 2 g twice daily but he could not afford it.  He did not pick up the samples nor apply for the patient assistance program. I will check lipids.  3. Morbid obesity: His weight is stable. Back pain has limited his ability to exercise.  4.  COPD: I previously obtained pulmonary function tests on 05/08/2018 with results consistent with COPD.  I started Spiriva.  He is borrowing this from a friend.  COVID-19 Education: The signs and symptoms of COVID-19 were discussed with the patient and how to seek care for testing (follow up with PCP or arrange E-visit).  The importance of social distancing was discussed today.  Time:   Today, I have spent 15 minutes with the patient with telehealth technology discussing the above problems.     Medication Adjustments/Labs and Tests Ordered: Current medicines are reviewed at length with the patient today.  Concerns regarding medicines are outlined above.   Tests Ordered: No orders of the defined types were placed in this encounter.   Medication Changes: No orders of the defined types were placed in this encounter.   Disposition:  Follow up in 1 year(s)  Signed,  Prentice DockerSuresh , MD  02/19/2019 10:13 AM    [facility]

## 2019-02-19 NOTE — Patient Instructions (Addendum)
Medication Instructions:  Continue all current medications.  Labwork:  Lipids - order enclosed.  Will do at [facility] Department.    Reminder:  Nothing to eat or drink after 12 midnight prior to labs.  Office will contact with results via phone or letter.    Testing/Procedures: none  Follow-Up: Your physician wants you to follow up in:  1 year.  You will receive a reminder letter in the mail one-two months in advance.  If you don't receive a letter, please call our office to schedule the follow up appointment   Any Other Special Instructions Will Be Listed Below (If Applicable).  If you need a refill on your cardiac medications before your next appointment, please call your pharmacy.

## 2019-02-19 NOTE — Addendum Note (Signed)
Addended by: Lesle Chris on: 02/19/2019 02:33 PM   Modules accepted: Orders

## 2019-03-18 ENCOUNTER — Encounter: Payer: Self-pay | Admitting: *Deleted

## 2019-04-30 ENCOUNTER — Telehealth: Payer: Self-pay | Admitting: Cardiovascular Disease

## 2019-04-30 NOTE — Telephone Encounter (Signed)
Actually my recommendation would be to wear one as he is at higher risk for contracting COVID given his COPD. Such a letter would need to either come from his PCP or pulmonologist.

## 2019-04-30 NOTE — Telephone Encounter (Signed)
Patient is very angry,states he wants Dr.Koneswaran to "take five minutes of his time" to call him.States he is not going to wear a mask as he can't.

## 2019-04-30 NOTE — Telephone Encounter (Signed)
Patient called stating that he has been diagnosed with COPD. 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   [phone] Hal Hope -wife )

## 2019-04-30 NOTE — Telephone Encounter (Signed)
I will forward to Dr Koneswaran 

## 2019-05-01 NOTE — Telephone Encounter (Signed)
Called pt. No answer, left message for pt to return call.  

## 2019-06-17 ENCOUNTER — Other Ambulatory Visit: Payer: Self-pay | Admitting: Cardiovascular Disease

## 2019-08-26 ENCOUNTER — Other Ambulatory Visit: Payer: Self-pay | Admitting: Cardiovascular Disease

## 2019-10-12 ENCOUNTER — Other Ambulatory Visit: Payer: Self-pay | Admitting: Cardiovascular Disease

## 2020-03-10 ENCOUNTER — Encounter: Payer: Self-pay | Admitting: Family Medicine

## 2020-03-10 ENCOUNTER — Ambulatory Visit (INDEPENDENT_AMBULATORY_CARE_PROVIDER_SITE_OTHER): Payer: Medicaid Other | Admitting: Family Medicine

## 2020-03-10 ENCOUNTER — Other Ambulatory Visit: Payer: Self-pay

## 2020-03-10 VITALS — BP 106/80 | HR 70 | Ht 70.0 in | Wt 230.2 lb

## 2020-03-10 DIAGNOSIS — E785 Hyperlipidemia, unspecified: Secondary | ICD-10-CM | POA: Diagnosis not present

## 2020-03-10 DIAGNOSIS — I2510 Atherosclerotic heart disease of native coronary artery without angina pectoris: Secondary | ICD-10-CM | POA: Diagnosis not present

## 2020-03-10 DIAGNOSIS — J449 Chronic obstructive pulmonary disease, unspecified: Secondary | ICD-10-CM | POA: Diagnosis not present

## 2020-03-10 NOTE — Patient Instructions (Addendum)
Your physician recommends that you schedule a follow-up appointment in: 1 YEAR WITH DR KONESWARAN  Your physician recommends that you continue on your current medications as directed. Please refer to the Current Medication list given to you today.  Thank you for choosing [facility]!!    

## 2020-03-10 NOTE — Progress Notes (Signed)
Cardiology Office Note  Date: 03/10/2020   ID: James Huynh, DOB 1961-05-10, MRN [id number]  PCP:  Dionisio Paschal, NP  Cardiologist:  Kate Sable, MD Electrophysiologist:  None   Chief Complaint: Follow-up CAD  History of Present Illness: James Huynh is a 59 y.o. male with a history of CAD, asthma, chest pain, GERD, hyperlipidemia, hypothyroidism, tobacco abuse, morbid obesity, COPD.  Cardiac cath 2016.  20% proximal RCA, 80% proximal to mid circumflex with DES, 80% mid LAD with DES.  Last seen by Dr. Harl Bowie on 02/19/2019 via telemedicine.  He was symptomatically stable with his coronary artery disease and decrease of life stressors.  He was continuing his hyperlipidemia therapy with Lipitor 80 mg daily.  His weight was stable his exercise ability was limited by back pain.  Previous obtained PFTs consistent with COPD.  Spiriva was previously is started.    Patient here for 1 year follow-up.  He denies any recent acute illnesses, hospitalizations, surgeries.  States he may need surgery on some umbilical hernias in the near future.  He continues to have back issues which are chronic.  He denies any recent anginal or exertional symptoms other than some chronic dyspnea secondary to COPD.  States he is currently on SSI disability.  He denies any CVA or TIA-like symptoms, palpitations or arrhythmias, orthostatic symptoms.  States he has had some recent blood in his stool he attributes to hemorrhoids.  Denies any claudication-like symptoms, DVT or PE-like symptoms, or lower extremity edema.  No PND or orthopnea.   Past Medical History:  Diagnosis Date  . Asthma   . CAD (coronary artery disease)    cath 09/09/2015 20% prox RCA, 80% prox to mid LCx treated with DES (3.518 mm Xience Alpine DES), 80% mid LAD lesion treated with 3.518 mm Xience Alpine DES stent postdilated to 4.2.  [name] Chest pain    a. negative stress echo August 2011  . GERD (gastroesophageal reflux disease)   .  High cholesterol   . History of pneumonia    a. 2014.  [name] Hypothyroidism   . Morbid obesity ([place])   . Tobacco abuse     Past Surgical History:  Procedure Laterality Date  . CARDIAC CATHETERIZATION N/A 09/09/2015   Procedure: Left Heart Cath and Coronary Angiography;  Surgeon: Troy Sine, MD;  Location: [place] CV LAB;  Service: Cardiovascular;  Laterality: N/A;  . CARDIAC CATHETERIZATION  09/09/2015   Procedure: Coronary Stent Intervention;  Surgeon: Troy Sine, MD;  Location: [place] CV LAB;  Service: Cardiovascular;;  . lymph gland removal    . MANDIBLE SURGERY     broken jaw    Current Outpatient Medications  Medication Sig Dispense Refill  . albuterol (PROAIR HFA) 108 (90 BASE) MCG/ACT inhaler Inhale 2 puffs into the lungs every 6 (six) hours as needed for wheezing or shortness of breath.    [name] albuterol (PROVENTIL) (2.5 MG/3ML) 0.083% nebulizer solution Take 2.5 mg by nebulization every 6 (six) hours as needed for wheezing or shortness of breath.     [name] aspirin 81 MG tablet Take 81 mg by mouth every evening.     [name] atorvastatin (LIPITOR) 80 MG tablet TAKE 1 TABLET BY MOUTH ONCE DAILY 6 IN THE EVENING 90 tablet 2  . furosemide (LASIX) 20 MG tablet Take 1 tablet by mouth once daily 90 tablet 2  . levothyroxine (SYNTHROID, LEVOTHROID) 137 MCG tablet Take 137 mcg by mouth daily before breakfast.    .  metoprolol tartrate (LOPRESSOR) 25 MG tablet TAKE 1/2 (ONE-HALF) TABLET BY MOUTH TWICE DAILY WITH FOOD 90 tablet 2  . nitroGLYCERIN (NITROSTAT) 0.4 MG SL tablet PLACE 1 TABLET UNDER THE TONGUE EVERY 5 MIN UP TO 3 TIMES AS NEEDED FOR CHEST PAIN 25 tablet 3  . tiotropium (SPIRIVA HANDIHALER) 18 MCG inhalation capsule Place 1 capsule (18 mcg total) into inhaler and inhale daily. 30 capsule 11  . VITAMIN D PO Take 1 capsule by mouth daily.     No current facility-administered medications for this visit.   Allergies:  Penicillins   Social History: The patient  reports that he  quit smoking about 4 years ago. His smoking use included cigarettes. He started smoking about 44 years ago. He has a 25.00 pack-year smoking history. He has never used smokeless tobacco. He reports current drug use. Frequency: 1.00 time per week. He reports that he does not drink alcohol.   Family History: The patient's family history includes Heart attack in his father; Lung cancer in his mother.   ROS:  Please see the history of present illness. Otherwise, complete review of systems is positive for none.  All other systems are reviewed and negative.   Physical Exam: VS:  BP 106/80   Pulse 70   Ht 5\' 10"  (1.778 m)   Wt 230 lb 3.2 oz (104.4 kg)   SpO2 95%   BMI 33.03 kg/m , BMI Body mass index is 33.03 kg/m.  Wt Readings from Last 3 Encounters:  03/10/20 230 lb 3.2 oz (104.4 kg)  02/19/19 218 lb (98.9 kg)  11/19/18 229 lb (103.9 kg)    General: Patient appears comfortable at rest. Neck: Supple, no elevated JVP or carotid bruits, no thyromegaly. Lungs: Clear to auscultation, nonlabored breathing at rest. Cardiac: Regular rate and rhythm, no S3 or significant systolic murmur, no pericardial rub. Extremities: No pitting edema, distal pulses 2+. Skin: Warm and dry. Musculoskeletal: No kyphosis. Neuropsychiatric: Alert and oriented x3, affect grossly appropriate.  ECG:  An ECG dated 03/10/2020 was personally reviewed today and demonstrated:  Normal sinus rhythm rate of 68  Recent Labwork: No results found for requested labs within last 8760 hours.     Component Value Date/Time   CHOL 224 (H) 09/09/2015 0239   TRIG 308 (H) 09/09/2015 0239   HDL 24 (L) 09/09/2015 0239   CHOLHDL 9.3 09/09/2015 0239   VLDL 62 (H) 09/09/2015 0239   LDLCALC 138 (H) 09/09/2015 0239    Other Studies Reviewed Today:  Echocardiogram 10/12/2015 Study Conclusions   - Left ventricle: The cavity size was normal. Wall thickness was  increased in a pattern of mild LVH. Systolic function was normal.   The estimated ejection fraction was in the range of 55% to 60%.  Possible mild hypokinesis of the apicalanteroseptal myocardium.  Left ventricular diastolic function parameters were normal for  the patient&'s age.  - Mitral valve: Calcified annulus. There was trivial regurgitation.  - Right atrium: Central venous pressure (est): 3 mm Hg.  - Atrial septum: No defect or patent foramen ovale was identified.  - Tricuspid valve: There was physiologic regurgitation.  - Pulmonary arteries: Systolic pressure could not be accurately  estimated.  - Pericardium, extracardiac: There was no pericardial effusion.   Impressions:   - Mild LVH with LVEF 55-60%, possible mild hypokinesis of the  distal anteroseptal wall noted. Grossly normal diastolic  function. Trivial mitral regurgitation.   Cardiac catheterization 09/09/2015  Conclusion   Prox RCA lesion, 20% stenosed.  Prox  Cx to Mid Cx lesion, 80% stenosed. Post intervention, there is a 0% residual stenosis.  Mid LAD lesion, 80% stenosed. Post intervention, there is a 0% residual stenosis.  There is mild left ventricular systolic dysfunction.   Mild LV dysfunction with a global ejection fraction at 45-50% with mild mid anterolateral and mid inferior hypocontractility.  Significant 2 vessel CAD with 80% eccentric stenosis in the proximal LAD with initial TIMI 2 flow; normal ramus intermediate vessel; 80% hazy proximal left circumflex stenosis; and irregularity in the RCA with 20% proximal stenosis.  Successful two-vessel intervention with PTCA/DES stenting with a 3.518 mm Xience Alpine DES stent in the proximal left circumflex coronary artery, postdilated to 3.72 mm in the stenosis being reduced to 0%; and successful PCI to the proximal LAD with ultimate insertion of a 3.518 mm Xience Alpine DES stent postdilated to 4.2, tapering to 4.12 mm with initial TIMI 2 flow improving to TIMI-3 flow and the stenosis being reduced to  0%.  RECOMMENDATION: The patient will continue on dual antiplatelet therapy for minimum of a year.  Aggressive lipid management, medical therapy for CAD, will be employed.  Smoking sensation was strongly advised.    Diagnostic Dominance: Right  Intervention        Assessment and Plan:  1. CAD in native artery   2. Hyperlipidemia LDL goal <70   3. Chronic obstructive pulmonary disease, unspecified COPD type (HCC)   4. Morbid obesity (HCC)    1. CAD in native artery He denies any recent anginal or exertional symptoms other than chronic dyspnea secondary to COPD.  Continue aspirin 81 mg daily, nitroglycerin sublingual as needed for chest pain.  Continue metoprolol 25 mg 1/2 tablet by mouth p.o. twice daily.  2. Hyperlipidemia LDL goal <70 Continue atorvastatin 80 mg p.o. daily.  States he had recent lab work at PCP office.  We will attempt to obtain those results.  3. Chronic obstructive pulmonary disease, unspecified COPD type (HCC) History of tobacco abuse and COPD.  States he has chronic dyspnea related to COPD.  4. Morbid obesity (HCC) Patient states he wants to get back to exercising but is limited by back pain and umbilical hernias.  He states once he gets hernia surgery he may be able to start his exercise program.  Medication Adjustments/Labs and Tests Ordered: Current medicines are reviewed at length with the patient today.  Concerns regarding medicines are outlined above.   Disposition: Follow-up with Dr. Purvis Sheffield or APP 1 year  Signed, [name], NP 03/10/2020 3:35 PM    [facility] at [facility] [address] [place], [place], [place] [address] Phone: [phone]; Fax: [phone]

## 2020-08-08 ENCOUNTER — Other Ambulatory Visit: Payer: Self-pay | Admitting: *Deleted

## 2020-08-08 MED ORDER — FUROSEMIDE 20 MG PO TABS: 20.0000 mg | ORAL_TABLET | Freq: Every day | ORAL | 2 refills | Status: DC

## 2020-08-11 ENCOUNTER — Emergency Department (HOSPITAL_COMMUNITY): Payer: Medicaid Other

## 2020-08-11 ENCOUNTER — Other Ambulatory Visit: Payer: Self-pay

## 2020-08-11 ENCOUNTER — Encounter (HOSPITAL_COMMUNITY): Payer: Self-pay | Admitting: Emergency Medicine

## 2020-08-11 ENCOUNTER — Emergency Department (HOSPITAL_COMMUNITY)
Admission: EM | Admit: 2020-08-11 | Discharge: 2020-08-11 | Disposition: A | Discharge status: Home / Self Care | Payer: Medicaid Other | Attending: Emergency Medicine | Admitting: Emergency Medicine

## 2020-08-11 ENCOUNTER — Ambulatory Visit: Payer: Self-pay

## 2020-08-11 DIAGNOSIS — R059 Cough, unspecified: Secondary | ICD-10-CM | POA: Diagnosis present

## 2020-08-11 DIAGNOSIS — Z87891 Personal history of nicotine dependence: Secondary | ICD-10-CM | POA: Insufficient documentation

## 2020-08-11 DIAGNOSIS — E876 Hypokalemia: Secondary | ICD-10-CM | POA: Diagnosis not present

## 2020-08-11 DIAGNOSIS — J441 Chronic obstructive pulmonary disease with (acute) exacerbation: Secondary | ICD-10-CM

## 2020-08-11 DIAGNOSIS — E039 Hypothyroidism, unspecified: Secondary | ICD-10-CM | POA: Insufficient documentation

## 2020-08-11 DIAGNOSIS — Z7982 Long term (current) use of aspirin: Secondary | ICD-10-CM | POA: Diagnosis not present

## 2020-08-11 DIAGNOSIS — Z79899 Other long term (current) drug therapy: Secondary | ICD-10-CM | POA: Diagnosis not present

## 2020-08-11 DIAGNOSIS — U071 COVID-19: Secondary | ICD-10-CM | POA: Diagnosis not present

## 2020-08-11 DIAGNOSIS — I2510 Atherosclerotic heart disease of native coronary artery without angina pectoris: Secondary | ICD-10-CM | POA: Diagnosis not present

## 2020-08-11 LAB — CBC WITH DIFFERENTIAL/PLATELET
Abs Immature Granulocytes: 0.02 10*3/uL (ref 0.00–0.07)
Basophils Absolute: 0 10*3/uL (ref 0.0–0.1)
Basophils Relative: 0 %
Eosinophils Absolute: 0 10*3/uL (ref 0.0–0.5)
Eosinophils Relative: 0 %
HCT: 43.5 % (ref 39.0–52.0)
Hemoglobin: 14.7 g/dL (ref 13.0–17.0)
Immature Granulocytes: 0 %
Lymphocytes Relative: 18 %
Lymphs Abs: 1.2 10*3/uL (ref 0.7–4.0)
MCH: 31.8 pg (ref 26.0–34.0)
MCHC: 33.8 g/dL (ref 30.0–36.0)
MCV: 94.2 fL (ref 80.0–100.0)
Monocytes Absolute: 0.9 10*3/uL (ref 0.1–1.0)
Monocytes Relative: 14 %
Neutro Abs: 4.5 10*3/uL (ref 1.7–7.7)
Neutrophils Relative %: 68 %
Platelets: 147 10*3/uL — ABNORMAL LOW (ref 150–400)
RBC: 4.62 MIL/uL (ref 4.22–5.81)
RDW: 13.6 % (ref 11.5–15.5)
WBC: 6.6 10*3/uL (ref 4.0–10.5)
nRBC: 0 % (ref 0.0–0.2)

## 2020-08-11 LAB — BASIC METABOLIC PANEL
Anion gap: 12 (ref 5–15)
BUN: 10 mg/dL (ref 6–20)
CO2: 28 mmol/L (ref 22–32)
Calcium: 8.4 mg/dL — ABNORMAL LOW (ref 8.9–10.3)
Chloride: 97 mmol/L — ABNORMAL LOW (ref 98–111)
Creatinine, Ser: 1.22 mg/dL (ref 0.61–1.24)
GFR, Estimated: >60 mL/min (ref >60)
Glucose, Bld: 108 mg/dL — ABNORMAL HIGH (ref 70–99)
Potassium: 2.7 mmol/L — CL (ref 3.5–5.1)
Sodium: 137 mmol/L (ref 135–145)

## 2020-08-11 LAB — TROPONIN I (HIGH SENSITIVITY): Troponin I (High Sensitivity): 7 ng/L (ref <18)

## 2020-08-11 LAB — D-DIMER, QUANTITATIVE: D-Dimer, Quant: <0.27 ug/mL-FEU (ref 0.00–0.50)

## 2020-08-11 LAB — RESPIRATORY PANEL BY RT PCR (FLU A&B, COVID)
Influenza A by PCR: NEGATIVE
Influenza B by PCR: NEGATIVE
SARS Coronavirus 2 by RT PCR: POSITIVE — AB

## 2020-08-11 MED ORDER — ALBUTEROL SULFATE HFA 108 (90 BASE) MCG/ACT IN AERS
6.0000 | INHALATION_SPRAY | Freq: Once | RESPIRATORY_TRACT | Status: AC
  Administered 2020-08-11: 6 via RESPIRATORY_TRACT
  Filled 2020-08-11: qty 6.7

## 2020-08-11 MED ORDER — DEXAMETHASONE 6 MG PO TABS: 6.0000 mg | ORAL_TABLET | Freq: Every day | ORAL | 0 refills | Status: AC

## 2020-08-11 MED ORDER — DEXAMETHASONE SODIUM PHOSPHATE 10 MG/ML IJ SOLN
10.0000 mg | Freq: Once | INTRAMUSCULAR | Status: AC
  Administered 2020-08-11: 10 mg via INTRAVENOUS
  Filled 2020-08-11: qty 1

## 2020-08-11 MED ORDER — POTASSIUM CHLORIDE CRYS ER 20 MEQ PO TBCR
40.0000 meq | EXTENDED_RELEASE_TABLET | Freq: Once | ORAL | Status: AC
  Administered 2020-08-11: 40 meq via ORAL
  Filled 2020-08-11: qty 2

## 2020-08-11 MED ORDER — SODIUM CHLORIDE 0.9 % IV BOLUS
1000.0000 mL | Freq: Once | INTRAVENOUS | Status: AC
  Administered 2020-08-11: 1000 mL via INTRAVENOUS

## 2020-08-11 MED ORDER — POTASSIUM CHLORIDE CRYS ER 20 MEQ PO TBCR: 20.0000 meq | EXTENDED_RELEASE_TABLET | Freq: Two times a day (BID) | ORAL | 0 refills | Status: DC

## 2020-08-11 MED ORDER — ONDANSETRON HCL 4 MG/2ML IJ SOLN
4.0000 mg | Freq: Once | INTRAMUSCULAR | Status: AC
  Administered 2020-08-11: 4 mg via INTRAVENOUS
  Filled 2020-08-11: qty 2

## 2020-08-11 NOTE — ED Provider Notes (Signed)
[facility] EMERGENCY DEPARTMENT Provider Note   CSN: [id number] Arrival date & time: 08/11/20  1357    History Chief Complaint  Patient presents with  . Covid Exposure    James Huynh is a 59 y.o. male with past medical history significant for CAD, COPD who presents for evaluation of upper respiratory complaints.  Patient states he developed cough on 08/03/2020.  Had Covid test on 10/25 which was negative.  Patient states he continues to have cough.  He now has intermittent back pain.  States he has a chest pain with coughing.  He feels like he has been "wheezy".  He has lost sensation of taste and smell.  Patient has no exertional chest pain.  Does not radiate to left arm, left jaw.  States he has been doing nebulizer treatments at home for breathing.  He feels like this has improved however he continues to feel wheezy.  States he is also been having multiple episodes of N/V NB emesis at home as well as nonbloody diarrhea.  No associated abdominal pain.  Denies fever, chills, hemoptysis, or leg swelling, redness, warmth, abdominal pain, dysuria.  Denies aggravating or relieving factors.  History obtained from patient and past medical records.  No interpreter use.  HPI     Past Medical History:  Diagnosis Date  . Asthma   . CAD (coronary artery disease)    cath 09/09/2015 20% prox RCA, 80% prox to mid LCx treated with DES (3.518 mm Xience Alpine DES), 80% mid LAD lesion treated with 3.518 mm Xience Alpine DES stent postdilated to 4.2.  [name] Chest pain    a. negative stress echo August 2011  . GERD (gastroesophageal reflux disease)   . High cholesterol   . History of pneumonia    a. 2014.  [name] Hypothyroidism   . Morbid obesity (HCC)   . Tobacco abuse     Patient Active Problem List   Diagnosis Date Noted  . Hypothyroidism   . High cholesterol   . Morbid obesity (HCC)   . Tobacco abuse   . NSTEMI (non-ST elevated myocardial infarction) (HCC) 09/08/2015    Past Surgical  History:  Procedure Laterality Date  . CARDIAC CATHETERIZATION N/A 09/09/2015   Procedure: Left Heart Cath and Coronary Angiography;  Surgeon: Lennette Bihari, MD;  Location: [facility] INVASIVE CV LAB;  Service: Cardiovascular;  Laterality: N/A;  . CARDIAC CATHETERIZATION  09/09/2015   Procedure: Coronary Stent Intervention;  Surgeon: Lennette Bihari, MD;  Location: [facility] INVASIVE CV LAB;  Service: Cardiovascular;;  . lymph gland removal    . MANDIBLE SURGERY     broken jaw       Family History  Problem Relation Age of Onset  . Heart attack Father        history of MI in his 46's with bypass surgery - now alive @ 7.  . Lung cancer Mother        died in the 53's.    Social History   Tobacco Use  . Smoking status: Former Smoker    Packs/day: 1.00    Years: 25.00    Pack years: 25.00    Types: Cigarettes    Start date: 08/06/1975    Quit date: 09/08/2015    Years since quitting: 4.9  . Smokeless tobacco: Never Used  Substance Use Topics  . Alcohol use: No    Alcohol/week: 0.0 standard drinks  . Drug use: Yes    Frequency: 1.0 times per week  Comment: occasionally smokes marijuana.    Home Medications Prior to Admission medications   Medication Sig Start Date End Date Taking? Authorizing Provider  albuterol (PROAIR HFA) 108 (90 BASE) MCG/ACT inhaler Inhale 2 puffs into the lungs every 6 (six) hours as needed for wheezing or shortness of breath.    [provider]  albuterol (PROVENTIL) (2.5 MG/3ML) 0.083% nebulizer solution Take 2.5 mg by nebulization every 6 (six) hours as needed for wheezing or shortness of breath.     [provider]  aspirin 81 MG tablet Take 81 mg by mouth every evening.     [provider]  atorvastatin (LIPITOR) 80 MG tablet TAKE 1 TABLET BY MOUTH ONCE DAILY 6 IN THE EVENING 09/19/18   Laqueta Linden, MD  dexamethasone (DECADRON) 6 MG tablet Take 1 tablet (6 mg total) by mouth daily for 4 days. 08/11/20 08/15/20  Janet Humphreys,  Kayte Borchard A, PA-C  furosemide (LASIX) 20 MG tablet Take 1 tablet (20 mg total) by mouth daily. 08/08/20   Netta Neat., NP  levothyroxine (SYNTHROID, LEVOTHROID) 137 MCG tablet Take 137 mcg by mouth daily before breakfast.    [provider]  metoprolol tartrate (LOPRESSOR) 25 MG tablet TAKE 1/2 (ONE-HALF) TABLET BY MOUTH TWICE DAILY WITH FOOD 06/17/19   Laqueta Linden, MD  nitroGLYCERIN (NITROSTAT) 0.4 MG SL tablet PLACE 1 TABLET UNDER THE TONGUE EVERY 5 MIN UP TO 3 TIMES AS NEEDED FOR CHEST PAIN 08/26/19   Laqueta Linden, MD  potassium chloride SA (KLOR-CON) 20 MEQ tablet Take 1 tablet (20 mEq total) by mouth 2 (two) times daily for 5 doses. 08/11/20 08/14/20  Karema Tocci A, PA-C  tiotropium (SPIRIVA HANDIHALER) 18 MCG inhalation capsule Place 1 capsule (18 mcg total) into inhaler and inhale daily. 05/15/18   Laqueta Linden, MD  VITAMIN D PO Take 1 capsule by mouth daily.    [provider]    Allergies    Penicillins  Review of Systems   Review of Systems  Constitutional: Positive for activity change, appetite change and fatigue.  HENT: Negative.   Respiratory: Positive for cough, shortness of breath and wheezing.   Cardiovascular: Positive for chest pain (Intermittent with coughing).  Gastrointestinal: Positive for diarrhea, nausea and vomiting. Negative for abdominal pain, anal bleeding, blood in stool, constipation and rectal pain.  Genitourinary: Negative.   Musculoskeletal: Negative.   Skin: Negative.   Neurological: Negative.   All other systems reviewed and are negative.   Physical Exam Updated Vital Signs BP 115/69 (BP Location: Right Arm)   Pulse 96   Temp 99.9 F (37.7 C) (Oral)   Resp 18   SpO2 96%   Physical Exam Vitals and nursing note reviewed.  Constitutional:      General: He is not in acute distress.    Appearance: He is not ill-appearing, toxic-appearing or diaphoretic.  HENT:     Head: Normocephalic and  atraumatic.     Jaw: There is normal jaw occlusion.     Right Ear: Tympanic membrane, ear canal and external ear normal. There is no impacted cerumen. No hemotympanum. Tympanic membrane is not injected, scarred, perforated, erythematous, retracted or bulging.     Left Ear: Tympanic membrane, ear canal and external ear normal. There is no impacted cerumen. No hemotympanum. Tympanic membrane is not injected, scarred, perforated, erythematous, retracted or bulging.     Ears:     Comments: No Mastoid tenderness.    Nose:     Comments:  Clear rhinorrhea and congestion to bilateral nares.  No sinus tenderness.    Mouth/Throat:     Comments: Posterior oropharynx clear.  Mucous membranes moist.  Tonsils without erythema or exudate.  Uvula midline without deviation.  No evidence of PTA or RPA.  No drooling, dysphasia or trismus.  Phonation normal. Neck:     Trachea: Trachea and phonation normal.     Meningeal: Brudzinski's sign and Kernig's sign absent.     Comments: No Neck stiffness or neck rigidity.  No meningismus.  No cervical lymphadenopathy. Cardiovascular:     Comments: No murmurs rubs or gallops. Pulmonary:     Effort: Pulmonary effort is normal.     Comments: Expiratory wheeze bilaterally.  Able to speak in full sentences without difficulty. Abdominal:     Comments: Soft, nontender without rebound or guarding.  No CVA tenderness.  Musculoskeletal:     Comments: Moves all 4 extremities without difficulty.  Lower extremities without edema, erythema or warmth.  Skin:    Comments: Brisk capillary refill.  No rashes or lesions.  Neurological:     Mental Status: He is alert.     Comments: Ambulatory in department without difficulty.  Cranial nerves II through XII grossly intact.  No facial droop.  No aphasia.     ED Results / Procedures / Treatments   Labs (all labs ordered are listed, but only abnormal results are displayed) Labs Reviewed  RESPIRATORY PANEL BY RT PCR (FLU A&B, COVID) -  Abnormal; Notable for the following components:      Result Value   SARS Coronavirus 2 by RT PCR POSITIVE (*)    All other components within normal limits  CBC WITH DIFFERENTIAL/PLATELET - Abnormal; Notable for the following components:   Platelets 147 (*)    All other components within normal limits  BASIC METABOLIC PANEL - Abnormal; Notable for the following components:   Potassium 2.7 (*)    Chloride 97 (*)    Glucose, Bld 108 (*)    Calcium 8.4 (*)    All other components within normal limits  D-DIMER, QUANTITATIVE (NOT AT [facility])  TROPONIN I (HIGH SENSITIVITY)  TROPONIN I (HIGH SENSITIVITY)    EKG EKG Interpretation  Date/Time:  Thursday August 11 2020 19:37:46 EDT Ventricular Rate:  94 PR Interval:    QRS Duration: 116 QT Interval:  354 QTC Calculation: 443 R Axis:   48 Text Interpretation: Sinus rhythm Borderline repolarization abnormality Confirmed by Gilda Crease [id number]) on 08/11/2020 8:09:57 PM   Radiology DG Chest Portable 1 View  Result Date: 08/11/2020 CLINICAL DATA:  Shortness of breath COVID exposure EXAM: PORTABLE CHEST 1 VIEW COMPARISON:  01/13/2020 FINDINGS: The heart size and mediastinal contours are within normal limits. Both lungs are clear. Old left upper rib fracture. IMPRESSION: No active disease. Electronically Signed   By: Jasmine Pang M.D.   On: 08/11/2020 19:13    Procedures Procedures (including critical care time)  Medications Ordered in ED Medications  dexamethasone (DECADRON) injection 10 mg (10 mg Intravenous Given 08/11/20 1928)  sodium chloride 0.9 % bolus 1,000 mL (0 mLs Intravenous Stopped 08/11/20 2028)  ondansetron (ZOFRAN) injection 4 mg (4 mg Intravenous Given 08/11/20 1928)  albuterol (VENTOLIN HFA) 108 (90 Base) MCG/ACT inhaler 6 puff (6 puffs Inhalation Given 08/11/20 1928)  potassium chloride SA (KLOR-CON) CR tablet 40 mEq (40 mEq Oral Given 08/11/20 2032)    ED Course  I have reviewed the triage vital signs and  the nursing notes.  Pertinent labs &  imaging results that were available during my care of the patient were reviewed by me and considered in my medical decision making (see chart for details).  Patient presents for evaluation of possible Covid. He is afebrile, nonseptic, not ill-appearing. Symptoms began 8 days ago. Tested 3 days ago was negative. Patient with continued cough, shortness of breath, wheezing, loss of taste and smell. Is also been having emesis and diarrhea at home without melena or bright blood per rectum. One episode of diarrhea. Does have diffuse wheeze on exam. Does admit to some shortness of breath. No exertional or pleuritic chest pain. No hemoptysis. No lateral leg swelling, redness or warmth. No clinical evidence of DVT on exam. Abdomen soft, nontender. Has a history of COPD and has been using albuterol inhaler frequently at home. Plan on steroids, labs, reassess  Labs and imaging personally reviewed and interpreted:  CBC without leukocytosis Metabolic panel with hypokalemia 2.7, glucose 108, chloride 97, noticed electrolyte, renal abnormality Troponin 7 D-dimer <0.27 Chest x-ray without infiltrates, cardiomegaly, pulmonary edema, pneumothorax EKG without ischemia  Patient reassessed. Breathing significantly improved with steroids, fluids and albuterol. I discussed patient's hypokalemia. Discussed IV replacement. Patient declines IV replacement at this time. States he has had this previously and would like oral replacement. EKG without any significant changes. Was given 40 mg oral potassium here and given prescription for potassium outpatient. He is ambulatory with out any hypoxia. Discussed close follow-up with PCP for recheck of his labs.  I have low suspicion for atypical ACS, PE, dissection, bacterial infectious process as cause of his symptoms. Seems patient likely with COPD exacerbation along with new diagnosis of Covid. Patient DC home in stable condition. He has been  without tachycardia, tachypnea or hypoxia here in the emergency department. Discussed return for any worsening symptoms. He is agreeable to this and is tolerating p.o. intake here in ED without difficulty.  The patient has been appropriately medically screened and/or stabilized in the ED. I have low suspicion for any other emergent medical condition which would require further screening, evaluation or treatment in the ED or require inpatient management.  Patient is hemodynamically stable and in no acute distress.  Patient able to ambulate in department prior to ED.  Evaluation does not show acute pathology that would require ongoing or additional emergent interventions while in the emergency department or further inpatient treatment.  I have discussed the diagnosis with the patient and answered all questions.  Pain is been managed while in the emergency department and patient has no further complaints prior to discharge.  Patient is comfortable with plan discussed in room and is stable for discharge at this time.  I have discussed strict return precautions for returning to the emergency department.  Patient was encouraged to follow-up with PCP/specialist refer to at discharge.    MDM Rules/Calculators/A&P                          James Huynh was evaluated in Emergency Department on 08/11/2020 for the symptoms described in the history of present illness. He was evaluated in the context of the global COVID-19 pandemic, which necessitated consideration that the patient might be at risk for infection with the SARS-CoV-2 virus that causes COVID-19. Institutional protocols and algorithms that pertain to the evaluation of patients at risk for COVID-19 are in a state of rapid change based on information released by regulatory bodies including the CDC and federal and state organizations. These policies and algorithms  were followed during the patient's care in the ED. Final Clinical Impression(s) / ED  Diagnoses Final diagnoses:  COVID  COPD exacerbation (HCC)  Hypokalemia    Rx / DC Orders ED Discharge Orders         Ordered    potassium chloride SA (KLOR-CON) 20 MEQ tablet  2 times daily        08/11/20 2023    dexamethasone (DECADRON) 6 MG tablet  Daily        08/11/20 2023           Kailash Hinze A, PA-C 08/11/20 2039    Gilda CreasePollina, Christopher J, MD 08/11/20 2352

## 2020-08-11 NOTE — Discharge Instructions (Signed)
Your Covid test was positive here in the emergency department.  Your potassium was low. You did not want IV potassium. You were given a dose of oral potassium here we will also write you for additional potassium prescription for at home. Follow-up with primary care provider to have these levels rechecked.  Use your albuterol inhaler as needed. I have started you on some steroids as well as a medication for coughing.  If you have any new or worsening symptoms please seek reevaluation the emergency department.

## 2020-08-11 NOTE — Telephone Encounter (Signed)
Pt. Reports he started having shortness of breath Tuesday. Tested negative for COVID 19. Has history of stent placement 2016. Has pain in his back with deep breaths. States "I'mscared." Does not have anyone to take him to ED. Will call 911.  Reason for Disposition . Sounds like a life-threatening emergency to the triager  Answer Assessment - Initial Assessment Questions 1. RESPIRATORY STATUS: "Describe your breathing?" (e.g., wheezing, shortness of breath, unable to speak, severe coughing)      Shortness of breath 2. ONSET: "When did this breathing problem begin?"      Tuesday 3. PATTERN "Does the difficult breathing come and go, or has it been constant since it started?"      Comes and goes 4. SEVERITY: "How bad is your breathing?" (e.g., mild, moderate, severe)    - MILD: No SOB at rest, mild SOB with walking, speaks normally in sentences, can lay down, no retractions, pulse < 100.    - MODERATE: SOB at rest, SOB with minimal exertion and prefers to sit, cannot lie down flat, speaks in phrases, mild retractions, audible wheezing, pulse 100-120.    - SEVERE: Very SOB at rest, speaks in single words, struggling to breathe, sitting hunched forward, retractions, pulse > 120      Moderate 5. RECURRENT SYMPTOM: "Have you had difficulty breathing before?" If Yes, ask: "When was the last time?" and "What happened that time?"      Yes 6. CARDIAC HISTORY: "Do you have any history of heart disease?" (e.g., heart attack, angina, bypass surgery, angioplasty)      Stents 2016 7. LUNG HISTORY: "Do you have any history of lung disease?"  (e.g., pulmonary embolus, asthma, emphysema)     COPD 8. CAUSE: "What do you think is causing the breathing problem?"      Unsure 9. OTHER SYMPTOMS: "Do you have any other symptoms? (e.g., dizziness, runny nose, cough, chest pain, fever)     Pain in back 10. PREGNANCY: "Is there any chance you are pregnant?" "When was your last menstrual period?"       n/a 11. TRAVEL:  "Have you traveled out of the country in the last month?" (e.g., travel history, exposures)       No  Protocols used: BREATHING DIFFICULTY-A-AH

## 2020-08-11 NOTE — ED Triage Notes (Signed)
Pt was exposed to covid Wednesday 10/20. Tested negative on 10/25  Now c/o of cough, back aches, need a breathing treatment, loss of taste. Pt would like to be tested again

## 2020-08-12 ENCOUNTER — Other Ambulatory Visit: Payer: Self-pay | Admitting: Physician Assistant

## 2020-08-12 ENCOUNTER — Ambulatory Visit (HOSPITAL_COMMUNITY)
Admission: RE | Admit: 2020-08-12 | Discharge: 2020-08-12 | Disposition: A | Discharge status: Home / Self Care | Payer: Medicaid Other | Source: Ambulatory Visit | Attending: Pulmonary Disease | Admitting: Pulmonary Disease

## 2020-08-12 DIAGNOSIS — U071 COVID-19: Secondary | ICD-10-CM | POA: Diagnosis present

## 2020-08-12 DIAGNOSIS — J449 Chronic obstructive pulmonary disease, unspecified: Secondary | ICD-10-CM | POA: Diagnosis present

## 2020-08-12 DIAGNOSIS — I2510 Atherosclerotic heart disease of native coronary artery without angina pectoris: Secondary | ICD-10-CM | POA: Insufficient documentation

## 2020-08-12 MED ORDER — DIPHENHYDRAMINE HCL 50 MG/ML IJ SOLN: 50.0000 mg | Freq: Once | INTRAMUSCULAR | Status: DC | PRN

## 2020-08-12 MED ORDER — EPINEPHRINE 0.3 MG/0.3ML IJ SOAJ: 0.3000 mg | Freq: Once | INTRAMUSCULAR | Status: DC | PRN

## 2020-08-12 MED ORDER — ALBUTEROL SULFATE HFA 108 (90 BASE) MCG/ACT IN AERS: 2.0000 | INHALATION_SPRAY | Freq: Once | RESPIRATORY_TRACT | Status: DC | PRN

## 2020-08-12 MED ORDER — METHYLPREDNISOLONE SODIUM SUCC 125 MG IJ SOLR: 125.0000 mg | Freq: Once | INTRAMUSCULAR | Status: DC | PRN

## 2020-08-12 MED ORDER — SODIUM CHLORIDE 0.9 % IV SOLN: Freq: Once | INTRAVENOUS | Status: AC

## 2020-08-12 MED ORDER — SODIUM CHLORIDE 0.9 % IV SOLN: INTRAVENOUS | Status: DC | PRN

## 2020-08-12 MED ORDER — FAMOTIDINE IN NACL 20-0.9 MG/50ML-% IV SOLN: 20.0000 mg | Freq: Once | INTRAVENOUS | Status: DC | PRN

## 2020-08-12 NOTE — Discharge Instructions (Signed)

## 2020-08-12 NOTE — Progress Notes (Signed)
  Diagnosis: COVID-19  Physician:p wright  Procedure: Covid Infusion Clinic Med: bamlanivimab\etesevimab infusion - Provided patient with bamlanimivab\etesevimab fact sheet for patients, parents and caregivers prior to infusion.  Complications: No immediate complications noted.  Discharge: Discharged home   Shaune Spittle 08/12/2020

## 2020-08-12 NOTE — Progress Notes (Signed)
I connected by phone with [name] on 08/12/2020 at 9:48 AM to discuss the potential use of a new treatment for mild to moderate COVID-19 viral infection in non-hospitalized patients.  This patient is a 59 y.o. male that meets the FDA criteria for Emergency Use Authorization of COVID monoclonal antibody casirivimab/imdevimab or bamlamivimab/estevimab.  Has a (+) direct SARS-CoV-2 viral test result  Has mild or moderate COVID-19   Is NOT hospitalized due to COVID-19  Is within 10 days of symptom onset  Has at least one of the high risk factor(s) for progression to severe COVID-19 and/or hospitalization as defined in EUA.  Specific high risk criteria : BMI > 25, Cardiovascular disease or hypertension, Chronic Lung Disease and Other high risk medical condition per CDC:  high SVI    I have spoken and communicated the following to the patient or parent/caregiver regarding COVID monoclonal antibody treatment:  1. FDA has authorized the emergency use for the treatment of mild to moderate COVID-19 in adults and pediatric patients with positive results of direct SARS-CoV-2 viral testing who are 13 years of age and older weighing at least 40 kg, and who are at high risk for progressing to severe COVID-19 and/or hospitalization.  2. The significant known and potential risks and benefits of COVID monoclonal antibody, and the extent to which such potential risks and benefits are unknown.  3. Information on available alternative treatments and the risks and benefits of those alternatives, including clinical trials.  4. Patients treated with COVID monoclonal antibody should continue to self-isolate and use infection control measures (e.g., wear mask, isolate, social distance, avoid sharing personal items, clean and disinfect "high touch" surfaces, and frequent handwashing) according to CDC guidelines.   5. The patient or parent/caregiver has the option to accept or refuse COVID monoclonal  antibody treatment.  After reviewing this information with the patient, the patient has agreed to receive one of the available covid 19 monoclonal antibodies and will be provided an appropriate fact sheet prior to infusion.  Sx onset 10/25. Set up for infusion on 10/29 @ 12:30pm. Directions given to [facility]. Pt is aware that insurance will be charged an infusion fee. Pt is unvaccinated.   Cline Crock 08/12/2020 9:48 AM

## 2020-10-06 ENCOUNTER — Other Ambulatory Visit: Payer: Self-pay | Admitting: *Deleted

## 2020-10-06 MED ORDER — METOPROLOL TARTRATE 25 MG PO TABS: ORAL_TABLET | ORAL | 2 refills | Status: DC

## 2020-11-04 ENCOUNTER — Other Ambulatory Visit: Payer: Self-pay | Admitting: *Deleted

## 2020-11-04 MED ORDER — NITROGLYCERIN 0.4 MG SL SUBL: SUBLINGUAL_TABLET | SUBLINGUAL | 3 refills | Status: DC

## 2020-12-29 DIAGNOSIS — J449 Chronic obstructive pulmonary disease, unspecified: Secondary | ICD-10-CM | POA: Insufficient documentation

## 2020-12-29 DIAGNOSIS — I2510 Atherosclerotic heart disease of native coronary artery without angina pectoris: Secondary | ICD-10-CM | POA: Insufficient documentation

## 2020-12-29 DIAGNOSIS — E785 Hyperlipidemia, unspecified: Secondary | ICD-10-CM | POA: Insufficient documentation

## 2020-12-29 NOTE — Progress Notes (Deleted)
Cardiology Office Note   Date:  12/29/2020   ID:  James Huynh, DOB 1961/10/13, MRN [id number]  PCP:  Vertis Kelch, NP  Cardiologist:   Rollene Rotunda, MD Referring:  ***  No chief complaint on file.     History of Present Illness: James Huynh is a 60 y.o. male who presents for evaluaiton of CAD.  hE Cardiac cath 2016 demonstrated  20% proximal RCA, 80% proximal to mid circumflex with DES, 80% mid LAD with DES.  ***  ***  Last seen by Dr. Wyline Mood on 02/19/2019 via telemedicine.  He was symptomatically stable with his coronary artery disease and decrease of life stressors.  He was continuing his hyperlipidemia therapy with Lipitor 80 mg daily.  His weight was stable his exercise ability was limited by back pain.  Previous obtained PFTs consistent with COPD.  Spiriva was previously is started.    Patient here for 1 year follow-up.  He denies any recent acute illnesses, hospitalizations, surgeries.  States he may need surgery on some umbilical hernias in the near future.  He continues to have back issues which are chronic.  He denies any recent anginal or exertional symptoms other than some chronic dyspnea secondary to COPD.  States he is currently on SSI disability.  He denies any CVA or TIA-like symptoms, palpitations or arrhythmias, orthostatic symptoms.  States he has had some recent blood in his stool he attributes to hemorrhoids.  Denies any claudication-like symptoms, DVT or PE-like symptoms, or lower extremity edema.  No PND or orthopnea.    Past Medical History:  Diagnosis Date  . Asthma   . CAD (coronary artery disease)    cath 09/09/2015 20% prox RCA, 80% prox to mid LCx treated with DES (3.518 mm Xience Alpine DES), 80% mid LAD lesion treated with 3.518 mm Xience Alpine DES stent postdilated to 4.2.  [name] Chest pain    a. negative stress echo August 2011  . GERD (gastroesophageal reflux disease)   . High cholesterol   . History of pneumonia    a. 2014.  [name]  Hypothyroidism   . Morbid obesity (HCC)   . Tobacco abuse     Past Surgical History:  Procedure Laterality Date  . CARDIAC CATHETERIZATION N/A 09/09/2015   Procedure: Left Heart Cath and Coronary Angiography;  Surgeon: Lennette Bihari, MD;  Location: [facility] INVASIVE CV LAB;  Service: Cardiovascular;  Laterality: N/A;  . CARDIAC CATHETERIZATION  09/09/2015   Procedure: Coronary Stent Intervention;  Surgeon: Lennette Bihari, MD;  Location: [facility] INVASIVE CV LAB;  Service: Cardiovascular;;  . lymph gland removal    . MANDIBLE SURGERY     broken jaw     Current Outpatient Medications  Medication Sig Dispense Refill  . albuterol (PROAIR HFA) 108 (90 BASE) MCG/ACT inhaler Inhale 2 puffs into the lungs every 6 (six) hours as needed for wheezing or shortness of breath.    [name] albuterol (PROVENTIL) (2.5 MG/3ML) 0.083% nebulizer solution Take 2.5 mg by nebulization every 6 (six) hours as needed for wheezing or shortness of breath.     [name] aspirin 81 MG tablet Take 81 mg by mouth every evening.     [name] atorvastatin (LIPITOR) 80 MG tablet TAKE 1 TABLET BY MOUTH ONCE DAILY 6 IN THE EVENING 90 tablet 2  . furosemide (LASIX) 20 MG tablet Take 1 tablet (20 mg total) by mouth daily. 90 tablet 2  . levothyroxine (SYNTHROID, LEVOTHROID) 137 MCG tablet Take 137 mcg by mouth  daily before breakfast.    . metoprolol tartrate (LOPRESSOR) 25 MG tablet TAKE 1/2 (ONE-HALF) TABLET BY MOUTH TWICE DAILY WITH FOOD 90 tablet 2  . nitroGLYCERIN (NITROSTAT) 0.4 MG SL tablet PLACE 1 TABLET UNDER THE TONGUE EVERY 5 MIN UP TO 3 TIMES AS NEEDED FOR CHEST PAIN 25 tablet 3  . potassium chloride SA (KLOR-CON) 20 MEQ tablet Take 1 tablet (20 mEq total) by mouth 2 (two) times daily for 5 doses. 5 tablet 0  . tiotropium (SPIRIVA HANDIHALER) 18 MCG inhalation capsule Place 1 capsule (18 mcg total) into inhaler and inhale daily. 30 capsule 11  . VITAMIN D PO Take 1 capsule by mouth daily.     No current facility-administered medications for this  visit.    Allergies:   Penicillins    ROS:  Please see the history of present illness.   Otherwise, review of systems are positive for {NONE DEFAULTED:18576::"none"}.   All other systems are reviewed and negative.    PHYSICAL EXAM: VS:  There were no vitals taken for this visit. , BMI There is no height or weight on file to calculate BMI. GENERAL:  Well appearing NECK:  No jugular venous distention, waveform within normal limits, carotid upstroke brisk and symmetric, no bruits, no thyromegaly LUNGS:  Clear to auscultation bilaterally CHEST:  Unremarkable HEART:  PMI not displaced or sustained,S1 and S2 within normal limits, no S3, no S4, no clicks, no rubs, *** murmurs ABD:  Flat, positive bowel sounds normal in frequency in pitch, no bruits, no rebound, no guarding, no midline pulsatile mass, no hepatomegaly, no splenomegaly EXT:  2 plus pulses throughout, no edema, no cyanosis no clubbing     ***GENERAL:  Well appearing HEENT:  Pupils equal round and reactive, fundi not visualized, oral mucosa unremarkable NECK:  No jugular venous distention, waveform within normal limits, carotid upstroke brisk and symmetric, no bruits, no thyromegaly LYMPHATICS:  No cervical, inguinal adenopathy LUNGS:  Clear to auscultation bilaterally BACK:  No CVA tenderness CHEST:  Unremarkable HEART:  PMI not displaced or sustained,S1 and S2 within normal limits, no S3, no S4, no clicks, no rubs, *** murmurs ABD:  Flat, positive bowel sounds normal in frequency in pitch, no bruits, no rebound, no guarding, no midline pulsatile mass, no hepatomegaly, no splenomegaly EXT:  2 plus pulses throughout, no edema, no cyanosis no clubbing SKIN:  No rashes no nodules NEURO:  Cranial nerves II through XII grossly intact, motor grossly intact throughout PSYCH:  Cognitively intact, oriented to person place and time    EKG:  EKG {ACTION; IS/IS YQM:[id number]} ordered today. The ekg ordered today demonstrates  ***   Recent Labs: 08/11/2020: BUN 10; Creatinine, Ser 1.22; Hemoglobin 14.7; Platelets 147; Potassium 2.7; Sodium 137    Lipid Panel    Component Value Date/Time   CHOL 224 (H) 09/09/2015 0239   TRIG 308 (H) 09/09/2015 0239   HDL 24 (L) 09/09/2015 0239   CHOLHDL 9.3 09/09/2015 0239   VLDL 62 (H) 09/09/2015 0239   LDLCALC 138 (H) 09/09/2015 0239      Wt Readings from Last 3 Encounters:  03/10/20 230 lb 3.2 oz (104.4 kg)  02/19/19 218 lb (98.9 kg)  11/19/18 229 lb (103.9 kg)      Other studies Reviewed: Additional studies/ records that were reviewed today include: ***. Review of the above records demonstrates:  Please see elsewhere in the note.  ***   ASSESSMENT AND PLAN:  CAD in native artery ***  He denies any  recent anginal or exertional symptoms other than chronic dyspnea secondary to COPD.  Continue aspirin 81 mg daily, nitroglycerin sublingual as needed for chest pain.  Continue metoprolol 25 mg 1/2 tablet by mouth p.o. twice daily.  Hyperlipidemia LDL goal <70 ***  Continue atorvastatin 80 mg p.o. daily.  States he had recent lab work at PCP office.  We will attempt to obtain those results.  Chronic obstructive pulmonary disease, unspecified COPD type (HCC) ***  History of tobacco abuse and COPD.  States he has chronic dyspnea related to COPD.  Morbid obesity (HCC) ***  Patient states he wants to get back to exercising but is limited by back pain and umbilical hernias.  He states once he gets hernia surgery he may be able to start his exercise program.   Current medicines are reviewed at length with the patient today.  The patient {ACTIONS; HAS/DOES NOT HAVE:19233} concerns regarding medicines.  The following changes have been made:  {PLAN; NO CHANGE:13088:s}  Labs/ tests ordered today include: *** No orders of the defined types were placed in this encounter.    Disposition:   FU with ***    Signed, Rollene Rotunda, MD  12/29/2020 9:25 PM    [facility]

## 2020-12-30 ENCOUNTER — Ambulatory Visit: Payer: Medicaid Other | Admitting: Cardiology

## 2020-12-30 DIAGNOSIS — E785 Hyperlipidemia, unspecified: Secondary | ICD-10-CM

## 2020-12-30 DIAGNOSIS — I2510 Atherosclerotic heart disease of native coronary artery without angina pectoris: Secondary | ICD-10-CM

## 2020-12-30 DIAGNOSIS — J449 Chronic obstructive pulmonary disease, unspecified: Secondary | ICD-10-CM

## 2021-01-16 NOTE — Progress Notes (Addendum)
Cardiology Office Note  Date: 01/17/2021   ID: James Huynh, DOB 06/27/1961, MRN [id number]  PCP:  [facility]  Cardiologist:  Rollene Rotunda, MD Electrophysiologist:  None   Chief Complaint: Follow-up CAD  History of Present Illness: James Huynh is a 60 y.o. male with a history of CAD, asthma, chest pain, GERD, hyperlipidemia, hypothyroidism, tobacco abuse, morbid obesity, COPD.  Cardiac cath 2016 :  20% proximal RCA, 80% proximal to mid circumflex with DES, 80% mid LAD with DES.  Last seen by Dr. Wyline Mood on 02/19/2019 via telemedicine.  He was symptomatically stable with his coronary artery disease and decrease of life stressors.  He was continuing his hyperlipidemia therapy with Lipitor 80 mg daily.  His weight was stable his exercise ability was limited by back pain.  Previous obtained PFTs consistent with COPD.  Spiriva was previously is started.   He was last here for 1 year follow-up.  He denies any recent acute illnesses, hospitalizations, surgeries.  Stated he may need surgery on some umbilical hernias in the near future.  He continues to have back issues which are chronic.  He denies any recent anginal or exertional symptoms other than some chronic dyspnea secondary to COPD.  Stated he was currently on SSI disability.  He denied any CVA or TIA-like symptoms, palpitations or arrhythmias, orthostatic symptoms.  States he has had some recent blood in his stool he attributed to hemorrhoids.  No claudication-like symptoms, DVT or PE-like symptoms, or lower extremity edema.  No PND or orthopnea.  He is here today for early 1 year follow-up.  He requested a sooner appointment.  He has been experiencing some increasing shortness of breath.  History of smoking/COPD.  Had a recent CT which showed evidence of emphysema per his statement..[facility] of [facility] on 12/27/2020 demonstrated emphysema and coronary artery atherosclerosis.  Indication for CT was DOE,  cough, chronic asthma.  States his atorvastatin was recently increased to 80 mg daily at PCP office.  He denies any anginal symptoms or nitroglycerin use.  Blood pressure is in the low range at 94/62.  Patient states his systolic blood pressure normally runs in the low 100s range.  He is asymptomatic.  Other than DOE and shortness of breath.  He denies any other issues.  No palpitations or arrhythmias, orthostatic symptoms, CVA or TIA-like symptoms, PND, orthopnea, bleeding, claudication-like symptoms, DVT or PE-like symptoms, or lower extremity edema.  His current cardiac regimen includes aspirin 81 mg daily, atorvastatin 40 mg daily, Lasix 20 mg daily, metoprolol 12.5 mg p.o. twice daily.  Sublingual nitroglycerin and potassium supplementation.  He has inhalers for asthma.  States he is now on disability   Past Medical History:  Diagnosis Date  . Asthma   . CAD (coronary artery disease)    cath 09/09/2015 20% prox RCA, 80% prox to mid LCx treated with DES (3.518 mm Xience Alpine DES), 80% mid LAD lesion treated with 3.518 mm Xience Alpine DES stent postdilated to 4.2.  [name] Chest pain    a. negative stress echo August 2011  . GERD (gastroesophageal reflux disease)   . High cholesterol   . History of pneumonia    a. 2014.  [name] Hypothyroidism   . Morbid obesity (HCC)   . Tobacco abuse     Past Surgical History:  Procedure Laterality Date  . CARDIAC CATHETERIZATION N/A 09/09/2015   Procedure: Left Heart Cath and Coronary Angiography;  Surgeon: Lennette Bihari, MD;  Location:  [facility] INVASIVE CV LAB;  Service: Cardiovascular;  Laterality: N/A;  . CARDIAC CATHETERIZATION  09/09/2015   Procedure: Coronary Stent Intervention;  Surgeon: Lennette Biharihomas A Kelly, MD;  Location: [facility] INVASIVE CV LAB;  Service: Cardiovascular;;  . lymph gland removal    . MANDIBLE SURGERY     broken jaw    Current Outpatient Medications  Medication Sig Dispense Refill  . albuterol (PROVENTIL) (2.5 MG/3ML) 0.083% nebulizer solution  Take 2.5 mg by nebulization every 6 (six) hours as needed for wheezing or shortness of breath.    [name]. albuterol (VENTOLIN HFA) 108 (90 Base) MCG/ACT inhaler Inhale 2 puffs into the lungs every 6 (six) hours as needed for wheezing or shortness of breath.    [name]. aspirin 81 MG tablet Take 81 mg by mouth every evening.    [name]. atorvastatin (LIPITOR) 40 MG tablet Take 80 mg by mouth daily.    . Cholecalciferol (VITAMIN D3 PO) Take 1 tablet by mouth daily.    . furosemide (LASIX) 20 MG tablet Take 1 tablet (20 mg total) by mouth daily. 90 tablet 2  . levothyroxine (SYNTHROID, LEVOTHROID) 137 MCG tablet Take 137 mcg by mouth daily before breakfast.    . metoprolol tartrate (LOPRESSOR) 25 MG tablet TAKE 1/2 (ONE-HALF) TABLET BY MOUTH TWICE DAILY WITH FOOD 90 tablet 2  . Multiple Vitamins-Minerals (CENTRUM FRESH/FRUITY 50+) CHEW Chew 1 tablet by mouth daily.    . nitroGLYCERIN (NITROSTAT) 0.4 MG SL tablet PLACE 1 TABLET UNDER THE TONGUE EVERY 5 MIN UP TO 3 TIMES AS NEEDED FOR CHEST PAIN 25 tablet 3  . omeprazole (PRILOSEC) 20 MG capsule Take 1 capsule by mouth daily.    . potassium chloride SA (KLOR-CON) 20 MEQ tablet Take 1 tablet (20 mEq total) by mouth 2 (two) times daily for 5 doses. 5 tablet 0  . STIOLTO RESPIMAT 2.5-2.5 MCG/ACT AERS Take 1 puff by mouth daily.     No current facility-administered medications for this visit.   Allergies:  Penicillins   Social History: The patient  reports that he quit smoking about 5 years ago. His smoking use included cigarettes. He started smoking about 45 years ago. He has a 25.00 pack-year smoking history. He has never used smokeless tobacco. He reports current drug use. Frequency: 1.00 time per week. He reports that he does not drink alcohol.   Family History: The patient's family history includes Heart attack in his father; Lung cancer in his mother.   ROS:  Please see the history of present illness. Otherwise, complete review of systems is positive for none.  All  other systems are reviewed and negative.   Physical Exam: VS:  BP 94/72   Pulse 69   Ht 5\' 10"  (1.778 m)   Wt 236 lb 3.2 oz (107.1 kg)   SpO2 95%   BMI 33.89 kg/m , BMI Body mass index is 33.89 kg/m.  Wt Readings from Last 3 Encounters:  01/17/21 236 lb 3.2 oz (107.1 kg)  03/10/20 230 lb 3.2 oz (104.4 kg)  02/19/19 218 lb (98.9 kg)    General: Patient appears comfortable at rest. Neck: Supple, no elevated JVP or carotid bruits, no thyromegaly. Lungs: Mild inspiratory and expiratory wheezing noted, nonlabored breathing at rest. Cardiac: Regular rate and rhythm, no S3 or significant systolic murmur, no pericardial rub. Extremities: No pitting edema, distal pulses 2+. Skin: Warm and dry. Musculoskeletal: No kyphosis. Neuropsychiatric: Alert and oriented x3, affect grossly appropriate.  ECG:  E[facility]/12/2019 [facility], ED sinus rhythm  rate of 94, nonspecific IVCD  Recent Labwork: 08/11/2020: BUN 10; Creatinine, Ser 1.22; Hemoglobin 14.7; Platelets 147; Potassium 2.7; Sodium 137     Component Value Date/Time   CHOL 224 (H) 09/09/2015 0239   TRIG 308 (H) 09/09/2015 0239   HDL 24 (L) 09/09/2015 0239   CHOLHDL 9.3 09/09/2015 0239   VLDL 62 (H) 09/09/2015 0239   LDLCALC 138 (H) 09/09/2015 0239    Other Studies Reviewed Today:  Echocardiogram 10/12/2015 Study Conclusions   - Left ventricle: The cavity size was normal. Wall thickness was  increased in a pattern of mild LVH. Systolic function was normal.  The estimated ejection fraction was in the range of 55% to 60%.  Possible mild hypokinesis of the apicalanteroseptal myocardium.  Left ventricular diastolic function parameters were normal for  the patient&'s age.  - Mitral valve: Calcified annulus. There was trivial regurgitation.  - Right atrium: Central venous pressure (est): 3 mm Hg.  - Atrial septum: No defect or patent foramen ovale was identified.  - Tricuspid valve: There was physiologic regurgitation.   - Pulmonary arteries: Systolic pressure could not be accurately  estimated.  - Pericardium, extracardiac: There was no pericardial effusion.   Impressions:   - Mild LVH with LVEF 55-60%, possible mild hypokinesis of the  distal anteroseptal wall noted. Grossly normal diastolic  function. Trivial mitral regurgitation.   Cardiac catheterization 09/09/2015 Conclusion   Prox RCA lesion, 20% stenosed.  Prox Cx to Mid Cx lesion, 80% stenosed. Post intervention, there is a 0% residual stenosis.  Mid LAD lesion, 80% stenosed. Post intervention, there is a 0% residual stenosis.  There is mild left ventricular systolic dysfunction.   Mild LV dysfunction with a global ejection fraction at 45-50% with mild mid anterolateral and mid inferior hypocontractility.  Significant 2 vessel CAD with 80% eccentric stenosis in the proximal LAD with initial TIMI 2 flow; normal ramus intermediate vessel; 80% hazy proximal left circumflex stenosis; and irregularity in the RCA with 20% proximal stenosis.  Successful two-vessel intervention with PTCA/DES stenting with a 3.518 mm Xience Alpine DES stent in the proximal left circumflex coronary artery, postdilated to 3.72 mm in the stenosis being reduced to 0%; and successful PCI to the proximal LAD with ultimate insertion of a 3.518 mm Xience Alpine DES stent postdilated to 4.2, tapering to 4.12 mm with initial TIMI 2 flow improving to TIMI-3 flow and the stenosis being reduced to 0%.  RECOMMENDATION: The patient will continue on dual antiplatelet therapy for minimum of a year.  Aggressive lipid management, medical therapy for CAD, will be employed.  Smoking sensation was strongly advised.    Diagnostic Dominance: Right    Intervention     Implants        Assessment and Plan:  1. CAD in native artery   2. Hyperlipidemia LDL goal <70   3. Chronic obstructive pulmonary disease, unspecified COPD type (HCC)   4. Morbid obesity (HCC)    5. Pulmonary emphysema, unspecified emphysema type (HCC)   6. Tobacco abuse   7. SOB (shortness of breath)    1. CAD in native artery /  He denies any recent anginal or exertional symptoms other than chronic dyspnea secondary to COPD.  Continue aspirin 81 mg daily, nitroglycerin sublingual as needed for chest pain.  Continue metoprolol 25 mg 1/2 tablet by mouth p.o. twice daily.  2. Hyperlipidemia LDL goal <70  Continue atorvastatin 80 mg p.o. daily.  States he had recent lab work at PCP office.  We  will attempt to obtain those results.  3. Chronic obstructive pulmonary disease, unspecified COPD type (HCC) History of tobacco abuse and COPD.  States he has chronic dyspnea related to COPD.  He states he had a recent CT scan at [facility] showing evidence of emphysema.  He has not seen a pulmonologist in the past.  Please refer to pulmonology for evidence of emphysema, DOE, history of smoking.  Currently non-smoker   4. Morbid obesity (HCC) At last visit he stated he wanted to get back to exercising but was limited by back pain and umbilical hernias.   5.  DOE Patient states he seems to be having more shortness of breath on exertion.  Please get a repeat echocardiogram.  To assess LV function, diastolic function, valvular function.  Last echocardiogram 2016.  Medication Adjustments/Labs and Tests Ordered: Current medicines are reviewed at length with the patient today.  Concerns regarding medicines are outlined above.   Disposition: Follow-up with Dr. Wyline Mood or APP 6 to 8 weeks  Signed, [name], NP 01/17/2021 8:45 AM    [facility] at [facility] [address] [place], [place], [place] [address] Phone: [phone]; Fax: [phone]

## 2021-01-17 ENCOUNTER — Encounter: Payer: Self-pay | Admitting: Family Medicine

## 2021-01-17 ENCOUNTER — Other Ambulatory Visit: Payer: Self-pay

## 2021-01-17 ENCOUNTER — Ambulatory Visit (INDEPENDENT_AMBULATORY_CARE_PROVIDER_SITE_OTHER): Payer: Medicaid Other | Admitting: Family Medicine

## 2021-01-17 ENCOUNTER — Encounter: Payer: Self-pay | Admitting: *Deleted

## 2021-01-17 VITALS — BP 94/72 | HR 69 | Ht 70.0 in | Wt 236.2 lb

## 2021-01-17 DIAGNOSIS — I2510 Atherosclerotic heart disease of native coronary artery without angina pectoris: Secondary | ICD-10-CM

## 2021-01-17 DIAGNOSIS — J449 Chronic obstructive pulmonary disease, unspecified: Secondary | ICD-10-CM

## 2021-01-17 DIAGNOSIS — J439 Emphysema, unspecified: Secondary | ICD-10-CM

## 2021-01-17 DIAGNOSIS — E785 Hyperlipidemia, unspecified: Secondary | ICD-10-CM

## 2021-01-17 DIAGNOSIS — R0602 Shortness of breath: Secondary | ICD-10-CM

## 2021-01-17 DIAGNOSIS — Z720 Tobacco use: Secondary | ICD-10-CM

## 2021-01-17 NOTE — Patient Instructions (Signed)
Medication Instructions:  Continue all current medications.  Labwork: none  Testing/Procedures:  Your physician has requested that you have an echocardiogram. Echocardiography is a painless test that uses sound waves to create images of your heart. It provides your doctor with information about the size and shape of your heart and how well your heart's chambers and valves are working. This procedure takes approximately one hour. There are no restrictions for this procedure.  Office will contact with results via phone or letter.    Follow-Up: 6 weeks   Any Other Special Instructions Will Be Listed Below (If Applicable). You have been referred to:  Pulmonology   If you need a refill on your cardiac medications before your next appointment, please call your pharmacy.

## 2021-01-19 ENCOUNTER — Telehealth: Payer: Self-pay | Admitting: *Deleted

## 2021-01-19 MED ORDER — EZETIMIBE 10 MG PO TABS: 10.0000 mg | ORAL_TABLET | Freq: Every day | ORAL | 6 refills | Status: DC

## 2021-01-19 NOTE — Telephone Encounter (Signed)
-----   Message from Netta Neat., NP sent at 01/19/2021  8:03 AM EDT ----- His labs still show elevated LDL above 70 at 146. He is on max dose of atorvastatin. Ask him if he would be willing to start Zetia to help lower the cholesterol. If so, start Zetia 10 mg daily. Get a follow up Lipid panel in 8 weeks. If on max dose of statins and Zetia  with no improvement, may need to refer to lipid clinic for injectable such as Repatha.

## 2021-01-19 NOTE — Telephone Encounter (Signed)
Lesle Chris, LPN  04/22/3902 0:09 PM EDT Back to Top     Notified, copy to pcp. He is agreeable to starting new medication.    Will mail reminder when time to repeat labs - he will do at pcp office ([facility]).

## 2021-02-13 ENCOUNTER — Other Ambulatory Visit: Payer: Self-pay

## 2021-02-13 ENCOUNTER — Encounter: Payer: Self-pay | Admitting: Internal Medicine

## 2021-02-13 ENCOUNTER — Ambulatory Visit (INDEPENDENT_AMBULATORY_CARE_PROVIDER_SITE_OTHER): Payer: Medicaid Other | Admitting: Internal Medicine

## 2021-02-13 VITALS — BP 104/72 | HR 100 | Temp 98.9°F | Ht 70.0 in | Wt 234.0 lb

## 2021-02-13 DIAGNOSIS — J449 Chronic obstructive pulmonary disease, unspecified: Secondary | ICD-10-CM | POA: Diagnosis not present

## 2021-02-13 MED ORDER — BREZTRI AEROSPHERE 160-9-4.8 MCG/ACT IN AERO: 2.0000 | INHALATION_SPRAY | Freq: Two times a day (BID) | RESPIRATORY_TRACT | 0 refills | Status: DC

## 2021-02-13 MED ORDER — OMEPRAZOLE 40 MG PO CPDR: 40.0000 mg | DELAYED_RELEASE_CAPSULE | Freq: Every day | ORAL | 11 refills | Status: DC

## 2021-02-13 MED ORDER — BUDESONIDE-FORMOTEROL FUMARATE 160-4.5 MCG/ACT IN AERO: INHALATION_SPRAY | RESPIRATORY_TRACT | 11 refills | Status: DC

## 2021-02-13 NOTE — Assessment & Plan Note (Addendum)
Quit smoking 01/2021 -  H/o childhood allergies "to everything" on allergy shots age 60 -38  - 02/13/2021  After extensive coaching inhaler device,  effectiveness =    75% (short ti) > try symbicort 160 2bid and if fails > change to breztri   In all likelihood  Group D in terms of symptom/risk and laba/lama/ICS  therefore appropriate rx at this point >>>  Start with symbicort 160 based on reported response to steroids with low threshold to up grade to breztri if insurance covers but important to understand how to use saba:  I spent extra time with pt today reviewing appropriate use of albuterol for prn use on exertion with the following points: 1) saba is for relief of sob that does not improve by walking a slower pace or resting but rather if the pt does not improve after trying this first. 2) If the pt is convinced, as many are, that saba helps recover from activity faster then it's easy to tell if this is the case by re-challenging : ie stop, take the inhaler, then p 5 minutes try the exact same activity (intensity of workload) that just caused the symptoms and see if they are substantially diminished or not after saba 3) if there is an activity that reproducibly causes the symptoms, try the saba 15 min before the activity on alternate days   If in fact the saba really does help, then fine to continue to use it prn but advised may need to look closer at the maintenance regimen being used to achieve better control of airways disease with exertion.           Each maintenance medication was reviewed in detail including emphasizing most importantly the difference between maintenance and prns and under what circumstances the prns are to be triggered using an action plan format where appropriate.  Total time for H and P, chart review, counseling, reviewing hfa device(s) and generating customized AVS unique to this office visit / same day charting = 61 min

## 2021-02-13 NOTE — Patient Instructions (Addendum)
Plan A = Automatic = Always=    Symbicort 160 Take 2 puffs first thing in am and then another 2 puffs about 12 hours later.   Work on inhaler technique:  relax and gently blow all the way out then take a nice smooth deep breath back in, triggering the inhaler at same time you start breathing in.  Hold for up to 5 seconds if you can. Blow out thru nose. Rinse and gargle with water when done   Plan B = Backup (to supplement plan A, not to replace it) Only use your albuterol inhaler as a rescue medication to be used if you can't catch your breath by resting or doing a relaxed purse lip breathing pattern.  - The less you use it, the better it will work when you need it. - Ok to use the inhaler up to 2 puffs  every 4 hours if you must but call for appointment if use goes up over your usual need - Don't leave home without it !!  (think of it like the spare tire for your car)   Plan C = Crisis (instead of Plan B but only if Plan B stops working) - only use your albuterol nebulizer if you first try Plan B and it fails to help > ok to use the nebulizer up to every 4 hours but if start needing it regularly call for immediate appointment  Omeprazole should be 40 mg  Take 30-60 min before first meal of the day   GERD (REFLUX)  is an extremely common cause of respiratory symptoms just like yours , many times with no obvious heartburn at all.    It can be treated with medication, but also with lifestyle changes including elevation of the head of your bed (ideally with 6 -8inch blocks under the headboard of your bed),  Smoking cessation, avoidance of late meals, excessive alcohol, and avoid fatty foods, chocolate, peppermint, colas, red wine, and acidic juices such as orange juice.  NO MINT OR MENTHOL PRODUCTS SO NO COUGH DROPS  USE SUGARLESS CANDY INSTEAD (Jolley ranchers or Stover's or Life Savers) or even ice chips will also do - the key is to swallow to prevent all throat clearing. NO OIL BASED VITAMINS -  use powdered substitutes.  Avoid fish oil when coughing.   Please schedule a follow up office visit in 6 weeks, call sooner if needed - bring your inhalers

## 2021-02-13 NOTE — Progress Notes (Signed)
James Huynh, male    DOB: 11-Feb-1961   MRN: [id number]   Brief patient profile:  60 yowm  Quit smoking (and inhaling) cigars  01/2021 p already stopped cigs in 1992 due to  dx of  asthma rx albuterol and more albuterol up to 4 inhalers  per month and much better after a week of prednisone referred to pulmonary clinic in [facility]  02/13/2021 by [name], PA  Born at [facility] with "need for trach before age [age]" but "parents refused" then subsequenly  no problem age 60 then episodic  facial swelling  and difficulty breathing eval by "[facility]"  allergist in [place] > allergic to everything"  took shots until age 60 still problems in spring mostly rhinitis not asthma until 1992.       History of Present Illness  02/13/2021  Pulmonary/ 1st office eval/ James Huynh / [facility]  Chief Complaint  Patient presents with  . Pulmonary Consult    Referred by [name], PA. Pt c/o SOB for the past 2 years worse over the past 2 wks. Pt states that he always feels like he is unable to take a deep enough breath, feels better when he yawns. He is using his albuterol several times per day.   Dyspnea: limited by R radicular back pain > sob  Cough: sporadic /no rhinitis at initial eval or noct cough  Sleep: no problem noct lying flat SABA use: way too much   No obvious day to day or daytime variability or assoc excess/ purulent sputum or mucus plugs or hemoptysis or cp or chest tightness, subjective wheeze or overt sinus or hb symptoms.   Sleeping  without nocturnal  or early am exacerbation  of respiratory  c/o's or need for noct saba. Also denies any obvious fluctuation of symptoms with weather or environmental changes or other aggravating or alleviating factors except as outlined above   No unusual exposure hx or h/o childhood pna/ asthma or knowledge of premature birth.  Current Allergies, Complete Past Medical History, Past Surgical History, Family History, and Social History were  reviewed in [employer] record.  ROS  The following are not active complaints unless bolded Hoarseness, sore throat, dysphagia, dental problems, itching, sneezing,  nasal congestion or discharge of excess mucus or purulent secretions, ear ache,   fever, chills, sweats, unintended wt loss or wt gain, classically pleuritic or exertional cp,  orthopnea pnd or arm/hand swelling  or leg swelling, presyncope, palpitations, abdominal pain, anorexia, nausea, vomiting, diarrhea  or change in bowel habits or change in bladder habits, change in stools or change in urine, dysuria, hematuria,  rash, arthralgias, visual complaints, headache, numbness, weakness or ataxia or problems with walking or coordination,  change in mood or  memory.               Past Medical History:  Diagnosis Date  . Asthma   . CAD (coronary artery disease)    cath 09/09/2015 20% prox RCA, 80% prox to mid LCx treated with DES (3.518 mm Xience Alpine DES), 80% mid LAD lesion treated with 3.518 mm Xience Alpine DES stent postdilated to 4.2.  [name] Chest pain    a. negative stress echo August 2011  . GERD (gastroesophageal reflux disease)   . High cholesterol   . History of pneumonia    a. 2014.  [name] Hypothyroidism   . Morbid obesity (HCC)   . Tobacco abuse     Outpatient Medications Prior to Visit  Medication Sig Dispense Refill  . albuterol (PROVENTIL) (2.5 MG/3ML) 0.083% nebulizer solution Take 2.5 mg by nebulization every 6 (six) hours as needed for wheezing or shortness of breath.    [name] albuterol (VENTOLIN HFA) 108 (90 Base) MCG/ACT inhaler Inhale 2 puffs into the lungs every 6 (six) hours as needed for wheezing or shortness of breath.    [name] aspirin 81 MG tablet Take 81 mg by mouth every evening.    [name] atorvastatin (LIPITOR) 40 MG tablet Take 80 mg by mouth daily.    . Cholecalciferol (VITAMIN D3 PO) Take 1 tablet by mouth daily.    [name] ezetimibe (ZETIA) 10 MG tablet Take 1 tablet (10 mg total) by mouth  daily. 30 tablet 6  . furosemide (LASIX) 20 MG tablet Take 1 tablet (20 mg total) by mouth daily. 90 tablet 2  . levothyroxine (SYNTHROID, LEVOTHROID) 137 MCG tablet Take 137 mcg by mouth daily before breakfast.    . metoprolol tartrate (LOPRESSOR) 25 MG tablet TAKE 1/2 (ONE-HALF) TABLET BY MOUTH TWICE DAILY WITH FOOD 90 tablet 2  . Multiple Vitamins-Minerals (CENTRUM FRESH/FRUITY 50+) CHEW Chew 1 tablet by mouth daily.    . nitroGLYCERIN (NITROSTAT) 0.4 MG SL tablet PLACE 1 TABLET UNDER THE TONGUE EVERY 5 MIN UP TO 3 TIMES AS NEEDED FOR CHEST PAIN 25 tablet 3  . Omega-3 Fatty Acids (FISH OIL PO) Take 1 capsule by mouth daily.    [name] omeprazole (PRILOSEC) 20 MG capsule Take 1 capsule by mouth daily.    [name] STIOLTO RESPIMAT 2.5-2.5 MCG/ACT AERS Take 1 puff by mouth daily.    . potassium chloride SA (KLOR-CON) 20 MEQ tablet Take 1 tablet (20 mEq total) by mouth 2 (two) times daily for 5 doses. 5 tablet 0   No facility-administered medications prior to visit.     Objective:     BP 104/72 (BP Location: Left Arm, Cuff Size: Normal)   Pulse 100   Temp 98.9 F (37.2 C) (Temporal)   Ht 5\' 10"  (1.778 m)   Wt 234 lb (106.1 kg)   SpO2 99% Comment: on RA  BMI 33.58 kg/m   SpO2: 99 % (on RA)   amb mildly obese tense wm nad   HEENT : pt wearing mask not removed for exam due to covid -19 concerns.    NECK :  without JVD/Nodes/TM/ nl carotid upstrokes bilaterally   LUNGS: no acc muscle use,  Nl contour chest which is clear to A and P bilaterally without cough on insp or exp maneuvers   CV:  RRR  no s3 or murmur or increase in P2, and no edema   ABD:  soft and nontender with nl inspiratory excursion in the supine position. No bruits or organomegaly appreciated, bowel sounds nl  MS:  Nl gait/ ext warm without deformities, calf tenderness, cyanosis or clubbing No obvious joint restrictions   SKIN: warm and dry without lesions    NEURO:  alert, approp, nl sensorium with  no motor or  cerebellar deficits apparent.    I personally reviewed images and agree with radiology impression as follows:  CXR:   Portable 08/11/20 No active disease.     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  Quit smoking 01/2021 -  H/o childhood allergies "to everything" on allergy shots age 60 -59  - 02/13/2021  After extensive coaching inhaler device,  effectiveness =    75% (short ti) > try symbicort 160 2bid and if fails > change to breztri  In all likelihood  Group D in terms of symptom/risk and laba/lama/ICS  therefore appropriate rx at this point >>>  Start with symbicort 160 based on reported response to steroids with low threshold to up grade to breztri if insurance covers but important to understand how to use saba:  I spent extra time with pt today reviewing appropriate use of albuterol for prn use on exertion with the following points: 1) saba is for relief of sob that does not improve by walking a slower pace or resting but rather if the pt does not improve after trying this first. 2) If the pt is convinced, as many are, that saba helps recover from activity faster then it's easy to tell if this is the case by re-challenging : ie stop, take the inhaler, then p 5 minutes try the exact same activity (intensity of workload) that just caused the symptoms and see if they are substantially diminished or not after saba 3) if there is an activity that reproducibly causes the symptoms, try the saba 15 min before the activity on alternate days   If in fact the saba really does help, then fine to continue to use it prn but advised may need to look closer at the maintenance regimen being used to achieve better control of airways disease with exertion.           Each maintenance medication was reviewed in detail including emphasizing most importantly the difference between maintenance and prns and under what circumstances the prns are to be triggered using an action plan format where appropriate.  Total  time for H and P, chart review, counseling, reviewing hfa device(s) and generating customized AVS unique to this office visit / same day charting = 61 min              Sandrea Hughs, MD 02/13/2021

## 2021-02-22 ENCOUNTER — Other Ambulatory Visit: Payer: Medicaid Other

## 2021-03-06 ENCOUNTER — Ambulatory Visit: Payer: Medicaid Other | Admitting: Family Medicine

## 2021-03-10 ENCOUNTER — Encounter: Payer: Self-pay | Admitting: *Deleted

## 2021-03-10 ENCOUNTER — Other Ambulatory Visit: Payer: Self-pay | Admitting: *Deleted

## 2021-03-10 DIAGNOSIS — E785 Hyperlipidemia, unspecified: Secondary | ICD-10-CM

## 2021-03-17 ENCOUNTER — Other Ambulatory Visit: Payer: Self-pay

## 2021-03-17 ENCOUNTER — Ambulatory Visit: Payer: Medicaid Other | Admitting: Internal Medicine

## 2021-03-17 ENCOUNTER — Encounter: Payer: Self-pay | Admitting: Internal Medicine

## 2021-03-17 DIAGNOSIS — J449 Chronic obstructive pulmonary disease, unspecified: Secondary | ICD-10-CM | POA: Diagnosis not present

## 2021-03-17 MED ORDER — BREZTRI AEROSPHERE 160-9-4.8 MCG/ACT IN AERO: 2.0000 | INHALATION_SPRAY | Freq: Two times a day (BID) | RESPIRATORY_TRACT | 0 refills | Status: DC

## 2021-03-17 MED ORDER — BREZTRI AEROSPHERE 160-9-4.8 MCG/ACT IN AERO: 2.0000 | INHALATION_SPRAY | Freq: Two times a day (BID) | RESPIRATORY_TRACT | 11 refills | Status: DC

## 2021-03-17 NOTE — Progress Notes (Signed)
James Huynh, male    DOB: [birth year]   MRN: [id number]   Brief patient profile:  60 yowm  Quit smoking (and inhaling) cigars  01/2021 p already stopped cigs in 1992 due to  dx of  asthma rx albuterol and more albuterol up to 4 inhalers  per month and much better after a week of prednisone referred to pulmonary clinic in [facility]  02/13/2021 by [name], PA  Born at [facility] with "need for trach before age [age]" but "parents refused" then subsequenly  no problem age 60 then episodic  facial swelling  and difficulty breathing eval by "[facility]"  allergist in [place] > allergic to everything"  took shots until age 60 still problems in spring mostly rhinitis not asthma until 1992.       History of Present Illness  02/13/2021  Pulmonary/ 1st office eval/ Payne Garske / [facility]  Chief Complaint  Patient presents with  . Pulmonary Consult    Referred by [name], PA. Pt c/o SOB for the past 2 years worse over the past 2 wks. Pt states that he always feels like he is unable to take a deep enough breath, feels better when he yawns. He is using his albuterol several times per day.   Dyspnea: limited by R radicular back pain > sob  Cough: sporadic /no rhinitis at initial eval or noct cough  Sleep: no problem noct lying flat SABA use: way too much  rec Plan A = Automatic = Always=    Symbicort 160 Take 2 puffs first thing in am and then another 2 puffs about 12 hours later.  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 Follow-up    No complaints currently   Dyspnea:  An aisle or two at [employer] /no 02  Cough: some am congestion x 30 min daily  Sleeping: flat bed on side  SABA use: not using neb now/ hfa 5-6 x per day  02: none  Covid status: never vaccinated/ had covid Oct 2021    No obvious day to day or daytime variability or assoc excess/ purulent sputum or mucus plugs or hemoptysis or cp or chest tightness, subjective wheeze or overt sinus or hb symptoms.   Sleeping  without nocturnal  or early am exacerbation  of respiratory  c/o's or need for noct saba. Also denies any obvious fluctuation of symptoms with weather or environmental changes or other aggravating or alleviating factors except as outlined above   No unusual exposure hx or h/o childhood pna/ asthma or knowledge of premature birth.  Current Allergies, Complete Past Medical History, Past Surgical History, Family History, and Social History were reviewed in [employer] record.  ROS  The following are not active complaints unless bolded Hoarseness, sore throat, dysphagia, dental problems, itching, sneezing,  nasal congestion or discharge of excess mucus or purulent secretions, ear ache,  fever, chills, sweats, unintended wt loss or wt gain, classically pleuritic or exertional cp,  orthopnea pnd or arm/hand swelling  or leg swelling, presyncope, palpitations, abdominal pain, anorexia, nausea, vomiting, diarrhea  or change in bowel habits or change in bladder habits, change in stools or change in urine, dysuria, hematuria,  rash, arthralgias, visual complaints, headache, numbness, weakness or ataxia or problems with walking or coordination,  change in mood or  memory.        Current Meds  Medication Sig  . albuterol (PROVENTIL) (2.5 MG/3ML) 0.083% nebulizer solution Take 2.5 mg by nebulization every 6 (six) hours as needed for wheezing or shortness of breath.  [name] albuterol (VENTOLIN HFA) 108 (90  Base) MCG/ACT inhaler Inhale 2 puffs into the lungs every 6 (six) hours as needed for wheezing or shortness of breath.  [name] aspirin 81 MG tablet Take 81 mg by mouth every evening.  [name] atorvastatin (LIPITOR) 40 MG tablet Take 80 mg by mouth daily.  . budesonide-formoterol (SYMBICORT) 160-4.5 MCG/ACT inhaler Take 2 puffs first thing in am and then another 2 puffs about 12 hours later.  . Cholecalciferol (VITAMIN D3 PO) Take 1 tablet by mouth daily.  [name] ezetimibe (ZETIA) 10 MG tablet Take 1 tablet (10 mg total) by mouth daily.  . furosemide (LASIX) 20 MG tablet Take 1 tablet (20 mg total) by mouth daily.  [name] levothyroxine (SYNTHROID, LEVOTHROID) 137 MCG tablet Take 137 mcg by mouth daily before breakfast.  . metoprolol tartrate (LOPRESSOR) 25 MG tablet TAKE 1/2 (ONE-HALF) TABLET BY MOUTH TWICE DAILY WITH FOOD  . Multiple Vitamins-Minerals (CENTRUM FRESH/FRUITY 50+) CHEW Chew 1 tablet by mouth daily.  . nitroGLYCERIN (NITROSTAT) 0.4 MG SL tablet PLACE 1 TABLET UNDER THE TONGUE EVERY 5 MIN UP TO 3 TIMES AS NEEDED FOR CHEST PAIN  . omeprazole (PRILOSEC) 40 MG capsule Take 1 capsule (40 mg total) by mouth daily.  . Tiotropium Bromide-Olodaterol (STIOLTO RESPIMAT) 2.5-2.5 MCG/ACT AERS Inhale 1 puff into the lungs daily.               Past Medical History:  Diagnosis Date  . Asthma   . CAD (coronary artery disease)    cath 09/09/2015 20% prox RCA, 80% prox to mid LCx treated with DES (3.518 mm Xience Alpine DES), 80% mid LAD lesion treated with 3.518 mm Xience Alpine DES stent postdilated to 4.2.  [name] Chest pain    a. negative stress echo August 2011  . GERD (gastroesophageal reflux disease)   . High cholesterol   . History of pneumonia    a. 2014.  [name] Hypothyroidism   . Morbid obesity (HCC)   . Tobacco abuse       Objective:     Wt Readings from Last 3 Encounters:  03/17/21 234 lb 9.6 oz (106.4 kg)  02/13/21 234 lb (106.1 kg)  01/17/21 236 lb 3.2 oz (107.1 kg)      Vital signs  reviewed  03/17/2021  - Note at rest 02 sats  97% on RA   General appearance:    amb mod obese  wm nad     HEENT : pt wearing mask not removed for exam due to covid -19 concerns.    NECK :  without JVD/Nodes/TM/ nl carotid upstrokes bilaterally   LUNGS: no acc muscle use,  Mod barrel  contour chest wall with bilateral  Distant bs s audible wheeze and  without cough on insp or exp maneuvers and mod  Hyperresonant  to  percussion bilaterally  CV:  RRR  no s3 or murmur or increase in P2, and no edema   ABD:  soft and nontender with pos mid insp Hoover's  in the supine position. No bruits or organomegaly appreciated, bowel sounds nl  MS:     ext warm without deformities, calf tenderness, cyanosis or clubbing No obvious joint restrictions   SKIN: warm and dry without lesions    NEURO:  alert, approp, nl sensorium with  no motor or cerebellar deficits apparent.            Assessment

## 2021-03-17 NOTE — Patient Instructions (Addendum)
Plan A = Automatic = Always=   breztri Take 2 puffs first thing in am and then another 2 puffs about 12 hours later.   OR STIOLTO 2 puffs in am    Work on inhaler technique:  relax and gently blow all the way out then take a nice smooth full deep breath back in, triggering the inhaler at same time you start breathing in.  Hold for up to 5 seconds if you can. Blow out thru nose. Rinse and gargle with water when done.  If mouth or throat bother you at all,  try brushing teeth/gums/tongue with arm and hammer toothpaste/ make a slurry and gargle and spit out.    Plan B = Backup (to supplement plan A, not to replace it) Only use your albuterol inhaler as a rescue medication to be used if you can't catch your breath by resting or doing a relaxed purse lip breathing pattern.  - The less you use it, the better it will work when you need it. - Ok to use the inhaler up to 2 puffs  every 4 hours if you must but call for appointment if use goes up over your usual need - Don't leave home without it !!  (think of it like the spare tire for your car)   Plan C = Crisis (instead of Plan B but only if Plan B stops working) - only use your albuterol nebulizer if you first try Plan B and it fails to help > ok to use the nebulizer up to every 4 hours but if start needing it regularly call for immediate appointment   Ok to try Try albuterol 15 min before an activity (on alternating days use inhaler then nebulizer then nothing)  that you know would make you short of breath and see if it makes any difference and if makes none then don't take albuterol after activity unless you can't catch your breath as this means it's the resting that helps, not the albuterol.   Please schedule a follow up visit in 3 months but call sooner if needed with pfts on return

## 2021-03-17 NOTE — Assessment & Plan Note (Addendum)
Quit smoking 01/2021 -  H/o childhood allergies "to everything" on allergy shots age 60 -85  - PFT's 05/08/18  FEV1 2.44 (65 % ) ratio 0.58  p 7 % improvement from saba p ? saba hfa and neb prior to study with DLCO  21.84 (67%) corrects to 3.55 (76%)  for alv volume and FV curve classic mild/mod concavity plus  ERV 35% at wt 231  - 02/13/2021   try symbicort 160 2bid and if fails > change to breztri    - 03/17/2021  After extensive coaching inhaler device,  effectiveness =  75%> try   Breztri  2 bid and if not happy with benefits vs cost then back to stiolto 2 pffs each am  -  03/17/2021   Walked RA  approx   300 ft  @ moderate pace  stopped due end of study with sats 98%     Overusing saba at baseline and likely  Group D in terms of symptom/risk and laba/lama/ICS  therefore appropriate rx at this point >>>  breztri trial plus appropriate saba  I spent extra time with pt today reviewing appropriate use of albuterol for prn use on exertion with the following points: 1) saba is for relief of sob that does not improve by walking a slower pace or resting but rather if the pt does not improve after trying this first. 2) If the pt is convinced, as many are, that saba helps recover from activity faster then it's easy to tell if this is the case by re-challenging : ie stop, take the inhaler, then p 5 minutes try the exact same activity (intensity of workload) that just caused the symptoms and see if they are substantially diminished or not after saba 3) if there is an activity that reproducibly causes the symptoms, try the saba 15 min before the activity on alternate days   If in fact the saba really does help, then fine to continue to use it prn but advised may need to look closer at the maintenance regimen being used to achieve better control of airways disease with exertion.   Each maintenance medication was reviewed in detail including emphasizing most importantly the difference between maintenance and prns and  under what circumstances the prns are to be triggered using an action plan format where appropriate.  Total time for H and P, chart review, counseling, reviewing hfa device(s) , directly observing portions of ambulatory 02 saturation study/ and generating customized AVS unique to this office visit / same day charting  > 30 min

## 2021-03-23 ENCOUNTER — Ambulatory Visit (INDEPENDENT_AMBULATORY_CARE_PROVIDER_SITE_OTHER): Payer: Medicaid Other

## 2021-03-23 DIAGNOSIS — R0602 Shortness of breath: Secondary | ICD-10-CM | POA: Diagnosis not present

## 2021-03-23 LAB — ECHOCARDIOGRAM COMPLETE
Area-P 1/2: 2.84 cm2
S' Lateral: 4.15 cm

## 2021-03-26 NOTE — Progress Notes (Signed)
Cardiology Office Note  Date: 03/27/2021   ID: James Huynh, DOB [birth year], MRN [id number]  PCP:  [facility]  Cardiologist:  Rollene Rotunda, MD Electrophysiologist:  None   Chief Complaint: Follow-up CAD  History of Present Illness: James Huynh is a 60 y.o. male with a history of CAD, asthma, chest pain, GERD, hyperlipidemia, hypothyroidism, tobacco abuse, morbid obesity, COPD.  Cardiac cath 2016 :  20% proximal RCA, 80% proximal to mid circumflex with DES, 80% mid LAD with DES.  Last seen by Dr. Wyline Mood on 02/19/2019 via telemedicine.  He was symptomatically stable with his coronary artery disease and decrease of life stressors.  He was continuing his hyperlipidemia therapy with Lipitor 80 mg daily.  His weight was stable his exercise ability was limited by back pain.  Previous obtained PFTs consistent with COPD.  Spiriva was previously is started.   He was last here for 1 year follow-up.  He denies any recent acute illnesses, hospitalizations, surgeries.  Stated he may need surgery on some umbilical hernias in the near future.  He continues to have back issues which are chronic.  He denies any recent anginal or exertional symptoms other than some chronic dyspnea secondary to COPD.  Stated he was currently on SSI disability.  He denied any CVA or TIA-like symptoms, palpitations or arrhythmias, orthostatic symptoms.  States he has had some recent blood in his stool he attributed to hemorrhoids.  No claudication-like symptoms, DVT or PE-like symptoms, or lower extremity edema.  No PND or orthopnea.  At last visit he was experiencing some increasing shortness of breath.  History of smoking/COPD.  Had a recent CT which showed evidence of emphysema per his statement.CT of chest at [facility] on 12/27/2020 demonstrated emphysema and coronary artery atherosclerosis.  Indication for CT was DOE, cough, chronic asthma.  His atorvastatin had recently been increased to 80 mg  daily at PCP office.  He denies any anginal symptoms or nitroglycerin use.  Blood pressure was in the low range at 94/62.  Patient states his systolic blood pressure normally runs in the low 100s range.  He was asymptomatic other than DOE and shortness of breath.  He did not any other issues.  No palpitations or arrhythmias, orthostatic symptoms, CVA or TIA-like symptoms, PND, orthopnea, bleeding, claudication-like symptoms, DVT or PE-like symptoms, or lower extremity edema.  His current cardiac regimen included aspirin 81 mg daily, atorvastatin 40 mg daily, Lasix 20 mg daily, metoprolol 12.5 mg p.o. twice daily.  Sublingual nitroglycerin and potassium supplementation.    He recently saw Dr. [name], pulmonology, on 03/18/2019.  He was started on Breztri.  States this has improved his breathing and he is relying less on the rescue inhaler.  We went over his recent echocardiogram which showed EF of 55%.  Trivial MR.[name]  He states his breathing has improved since starting the new inhaler.  He denies any anginal or exertional symptoms.  Denies any palpitations or arrhythmias.  No CVA or TIA-like symptoms, orthostatic symptoms, PND, orthopnea, bleeding.  No claudication-like symptoms, DVT or PE-like symptoms, lower extremity edema.   Past Medical History:  Diagnosis Date   Asthma    CAD (coronary artery disease)    cath 09/09/2015 20% prox RCA, 80% prox to mid LCx treated with DES (3.518 mm Xience Alpine DES), 80% mid LAD lesion treated with 3.518 mm Xience Alpine DES stent postdilated to 4.2.   Chest pain    a. negative stress echo August 2011  GERD (gastroesophageal reflux disease)    High cholesterol    History of pneumonia    a. 2014.   Hypothyroidism    Morbid obesity (HCC)    Tobacco abuse     Past Surgical History:  Procedure Laterality Date   CARDIAC CATHETERIZATION N/A 09/09/2015   Procedure: Left Heart Cath and Coronary Angiography;  Surgeon: Lennette Biharihomas A Kelly, MD;  Location: [facility] INVASIVE CV  LAB;  Service: Cardiovascular;  Laterality: N/A;   CARDIAC CATHETERIZATION  09/09/2015   Procedure: Coronary Stent Intervention;  Surgeon: Lennette Biharihomas A Kelly, MD;  Location: [facility] INVASIVE CV LAB;  Service: Cardiovascular;;   lymph gland removal     MANDIBLE SURGERY     broken jaw    Current Outpatient Medications  Medication Sig Dispense Refill   albuterol (PROVENTIL) (2.5 MG/3ML) 0.083% nebulizer solution Take 2.5 mg by nebulization every 6 (six) hours as needed for wheezing or shortness of breath.     albuterol (VENTOLIN HFA) 108 (90 Base) MCG/ACT inhaler Inhale 2 puffs into the lungs every 6 (six) hours as needed for wheezing or shortness of breath.     aspirin 81 MG tablet Take 81 mg by mouth every evening.     atorvastatin (LIPITOR) 80 MG tablet Take 1 tablet by mouth daily.     Budeson-Glycopyrrol-Formoterol (BREZTRI AEROSPHERE) 160-9-4.8 MCG/ACT AERO Inhale 2 puffs into the lungs 2 (two) times daily. 10.7 g 11   Cholecalciferol (VITAMIN D3 PO) Take 1 tablet by mouth daily.     EUTHYROX 150 MCG tablet Take 150 mcg by mouth daily.     ezetimibe (ZETIA) 10 MG tablet Take 1 tablet (10 mg total) by mouth daily. 30 tablet 6   furosemide (LASIX) 20 MG tablet Take 1 tablet (20 mg total) by mouth daily. 90 tablet 2   metoprolol tartrate (LOPRESSOR) 25 MG tablet TAKE 1/2 (ONE-HALF) TABLET BY MOUTH TWICE DAILY WITH FOOD 90 tablet 2   Multiple Vitamins-Minerals (CENTRUM FRESH/FRUITY 50+) CHEW Chew 1 tablet by mouth daily.     nitroGLYCERIN (NITROSTAT) 0.4 MG SL tablet PLACE 1 TABLET UNDER THE TONGUE EVERY 5 MIN UP TO 3 TIMES AS NEEDED FOR CHEST PAIN 25 tablet 3   omeprazole (PRILOSEC) 40 MG capsule Take 1 capsule (40 mg total) by mouth daily. 30 capsule 11   No current facility-administered medications for this visit.   Allergies:  Penicillins   Social History: The patient  reports that he quit smoking about 8 weeks ago. His smoking use included cigarettes. He started smoking about 45 years ago. He  has a 32.00 pack-year smoking history. He has never used smokeless tobacco. He reports current drug use. Frequency: 1.00 time per week. He reports that he does not drink alcohol.   Family History: The patient's family history includes Heart attack in his father; Lung cancer in his mother.   ROS:  Please see the history of present illness. Otherwise, complete review of systems is positive for none.  All other systems are reviewed and negative.   Physical Exam: VS:  BP 96/64   Pulse (!) 56   Ht 5\' 10"  (1.778 m)   Wt 234 lb (106.1 kg)   SpO2 98%   BMI 33.58 kg/m , BMI Body mass index is 33.58 kg/m.  Wt Readings from Last 3 Encounters:  03/27/21 234 lb (106.1 kg)  03/17/21 234 lb 9.6 oz (106.4 kg)  02/13/21 234 lb (106.1 kg)    General: Patient appears comfortable at rest. Neck: Supple, no elevated JVP  or carotid bruits, no thyromegaly. Lungs: Mild inspiratory and expiratory wheezing noted, nonlabored breathing at rest. Cardiac: Regular rate and rhythm, no S3 or significant systolic murmur, no pericardial rub. Extremities: No pitting edema, distal pulses 2+. Skin: Warm and dry. Musculoskeletal: No kyphosis. Neuropsychiatric: Alert and oriented x3, affect grossly appropriate.  ECG:  EKG 08/12/2019 at [facility], ED sinus rhythm rate of 94, nonspecific IVCD  Recent Labwork: 08/11/2020: BUN 10; Creatinine, Ser 1.22; Hemoglobin 14.7; Platelets 147; Potassium 2.7; Sodium 137     Component Value Date/Time   CHOL 224 (H) 09/09/2015 0239   TRIG 308 (H) 09/09/2015 0239   HDL 24 (L) 09/09/2015 0239   CHOLHDL 9.3 09/09/2015 0239   VLDL 62 (H) 09/09/2015 0239   LDLCALC 138 (H) 09/09/2015 0239    Other Studies Reviewed Today:    Echocardiogram 03/23/2021  1. Left ventricular ejection fraction, by estimation, is approximately 55%. The left ventricle has normal function. Left ventricular endocardial border not optimally defined to evaluate regional wall motion. Left ventricular  diastolic parameters are indeterminate. Consider Definity contrast with future studies. 2. Right ventricular systolic function is normal. The right ventricular size is normal. Tricuspid regurgitation signal is inadequate for assessing PA pressure. 3. The mitral valve is grossly normal. Trivial mitral valve regurgitation. 4. The aortic valve was not well visualized. Aortic valve regurgitation is not visualized. 5. The inferior vena cava is normal in size with greater than 50% respiratory variability, suggesting right atrial pressure of 3 mmHg. Comparison(s): Previous Echo showed LV EF 55-60%, mild LVH, possible milk HK of the apicalanteroseptal myocardium.   Echocardiogram 10/12/2015 Study Conclusions   - Left ventricle: The cavity size was normal. Wall thickness was    increased in a pattern of mild LVH. Systolic function was normal.    The estimated ejection fraction was in the range of 55% to 60%.    Possible mild hypokinesis of the apicalanteroseptal myocardium.    Left ventricular diastolic function parameters were normal for    the patient&'s age.  - Mitral valve: Calcified annulus. There was trivial regurgitation.  - Right atrium: Central venous pressure (est): 3 mm Hg.  - Atrial septum: No defect or patent foramen ovale was identified.  - Tricuspid valve: There was physiologic regurgitation.  - Pulmonary arteries: Systolic pressure could not be accurately    estimated.  - Pericardium, extracardiac: There was no pericardial effusion.   Impressions:   - Mild LVH with LVEF 55-60%, possible mild hypokinesis of the    distal anteroseptal wall noted. Grossly normal diastolic    function. Trivial mitral regurgitation.   Cardiac catheterization 09/09/2015 Conclusion  Prox RCA lesion, 20% stenosed. Prox Cx to Mid Cx lesion, 80% stenosed. Post intervention, there is a 0% residual stenosis. Mid LAD lesion, 80% stenosed. Post intervention, there is a 0% residual stenosis. There is  mild left ventricular systolic dysfunction.   Mild LV dysfunction with a global ejection fraction at 45-50% with mild mid anterolateral and mid inferior hypocontractility.   Significant 2 vessel CAD with 80% eccentric stenosis in the proximal LAD with initial TIMI 2 flow; normal ramus intermediate vessel; 80% hazy proximal left circumflex stenosis; and irregularity in the RCA with 20% proximal stenosis.   Successful two-vessel intervention with PTCA/DES stenting with a 3.518 mm Xience Alpine DES stent in the proximal left circumflex coronary artery, postdilated to 3.72 mm in the stenosis being reduced to 0%; and successful PCI to the proximal LAD with ultimate insertion of a 3.518 mm Xience  Alpine DES stent postdilated to 4.2, tapering to 4.12 mm with initial TIMI 2 flow improving to TIMI-3 flow and the stenosis being reduced to 0%.   RECOMMENDATION: The patient will continue on dual antiplatelet therapy for minimum of a year.  Aggressive lipid management, medical therapy for CAD, will be employed.  Smoking sensation was strongly advised.     Diagnostic Dominance: Right    Intervention     Implants         Assessment and Plan:  1. CAD in native artery   2. Hyperlipidemia LDL goal <70   3. Chronic obstructive pulmonary disease, unspecified COPD type (HCC)   4. Morbid obesity (HCC)   5. DOE (dyspnea on exertion)     1. CAD in native artery  He denies any recent anginal or exertional symptoms.  Continue aspirin 81 mg daily, nitroglycerin sublingual as needed for chest pain.  Continue metoprolol 25 mg 1/2 tablet by mouth p.o. twice daily.  2. Hyperlipidemia LDL goal <70  Continue atorvastatin 80 mg p.o. daily.  States he had recent lab work at PCP office.  We will attempt to obtain those results. Recent lipid panel on 12/13/2020 from PCP office showed TC 205, triglyceride 475, HDL 30, LDL 146.  We ordered repeat FLP's and LFTs on 03/10/2021.  Apparently these have not been done  yet.  3. Chronic obstructive pulmonary disease, unspecified COPD type (HCC) History of tobacco abuse and COPD.  States he has chronic dyspnea related to COPD.  He states he had a recent CT scan at [facility] showing evidence of emphysema.  Recently saw Dr. [name] pulmonology who started him on [facility] inhaler.  States he is using his rescue inhaler much less since starting [place].  States his breathing has improved some.  4. Morbid obesity (HCC) At last visit he stated he wanted to get back to exercising but was limited by back pain and umbilical hernias.   5.  DOE States he recently had a visit with pulmonology who started him on [place] and states it has improved his breathing.  He states he is relying less on his rescue inhaler since starting this medication.  Recent echocardiogram showed EF of 55% and trivial MR on 03/23/2021  Medication Adjustments/Labs and Tests Ordered: Current medicines are reviewed at length with the patient today.  Concerns regarding medicines are outlined above.   Disposition: Follow-up with Dr. Wyline Mood or APP 6 months  Signed, [name], NP 03/27/2021 9:58 AM    [facility] [address] [place], [place], [place] [address] Phone: [phone]; Fax: [phone]

## 2021-03-27 ENCOUNTER — Encounter: Payer: Self-pay | Admitting: Family Medicine

## 2021-03-27 ENCOUNTER — Ambulatory Visit (INDEPENDENT_AMBULATORY_CARE_PROVIDER_SITE_OTHER): Payer: Medicaid Other | Admitting: Family Medicine

## 2021-03-27 VITALS — BP 96/64 | HR 56 | Ht 70.0 in | Wt 234.0 lb

## 2021-03-27 DIAGNOSIS — R0609 Other forms of dyspnea: Secondary | ICD-10-CM

## 2021-03-27 DIAGNOSIS — I2510 Atherosclerotic heart disease of native coronary artery without angina pectoris: Secondary | ICD-10-CM

## 2021-03-27 DIAGNOSIS — E785 Hyperlipidemia, unspecified: Secondary | ICD-10-CM | POA: Diagnosis not present

## 2021-03-27 DIAGNOSIS — J449 Chronic obstructive pulmonary disease, unspecified: Secondary | ICD-10-CM

## 2021-03-27 DIAGNOSIS — R0600 Dyspnea, unspecified: Secondary | ICD-10-CM

## 2021-03-27 NOTE — Patient Instructions (Addendum)

## 2021-04-06 ENCOUNTER — Telehealth: Payer: Self-pay | Admitting: Internal Medicine

## 2021-04-06 NOTE — Telephone Encounter (Signed)
Approvedtoday PA Case: [id number], Status: Approved, Coverage Starts on: 04/06/2021 12:00:00 AM, Coverage Ends on: 04/06/2022 12:00:00 AM.   Did the PA for the [facility].  This was approved.  I have called the pharmacy to make them aware and the pt is aware.  I have called the pt and he is aware.

## 2021-06-15 ENCOUNTER — Other Ambulatory Visit: Payer: Self-pay | Admitting: Family Medicine

## 2021-06-16 ENCOUNTER — Ambulatory Visit: Payer: Medicaid Other | Admitting: Internal Medicine

## 2021-07-09 NOTE — Progress Notes (Addendum)
Cardiology Office Note  Date: 07/10/2021   ID: James Huynh, DOB Nov 26, 1960, MRN [id number]  PCP:  [facility]  Cardiologist:  Rollene Rotunda, MD Electrophysiologist:  None   Chief Complaint: Weird feeling in chest  History of Present Illness: James Huynh is a 60 y.o. male with a history of CAD, asthma, chest pain, GERD, hyperlipidemia, hypothyroidism, tobacco abuse, morbid obesity, COPD.  Cardiac cath 2016 :  20% proximal RCA, 80% proximal to mid circumflex with DES, 80% mid LAD with DES.  Last seen by Dr. Wyline Mood on 02/19/2019 via telemedicine.  He was symptomatically stable with his coronary artery disease and decrease of life stressors.  He was continuing his hyperlipidemia therapy with Lipitor 80 mg daily.  His weight was stable his exercise ability was limited by back pain.  Previous obtained PFTs consistent with COPD.  Spiriva was previously is started.    At last visit he was experiencing some increasing shortness of breath.  History of smoking/COPD.  Had a recent CT which showed evidence of emphysema per his statement.CT of chest at [facility] on 12/27/2020 demonstrated emphysema and coronary artery atherosclerosis.  Indication for CT was DOE, cough, chronic asthma.  His atorvastatin had recently been increased to 80 mg daily at PCP office.  He denies any anginal symptoms or nitroglycerin use.  Blood pressure was in the low range at 94/62.  Patient states his systolic blood pressure normally runs in the low 100s range.  He was asymptomatic other than DOE and shortness of breath.  He did not any other issues.  No palpitations or arrhythmias, orthostatic symptoms, CVA or TIA-like symptoms, PND, orthopnea, bleeding, claudication-like symptoms, DVT or PE-like symptoms, or lower extremity edema.  His current cardiac regimen included aspirin 81 mg daily, atorvastatin 40 mg daily, Lasix 20 mg daily, metoprolol 12.5 mg p.o. twice daily.  Sublingual nitroglycerin and  potassium supplementation.    He recently saw Dr. [name], pulmonology, on 03/18/2019.  He was started on Breztri.  This improved his breathing and he is relying less on the rescue inhaler.  We went over his recent echocardiogram which showed EF of 55%.  Trivial MR.[name]  He states his breathing has improved since starting the new inhaler.  He denies any anginal or exertional symptoms.  Denies any palpitations or arrhythmias.  No CVA or TIA-like symptoms, orthostatic symptoms, PND, orthopnea, bleeding.  No claudication-like symptoms, DVT or PE-like symptoms, lower extremity edema.  He is here today with recent complaints of unusual feeling in his chest when he was at a recent event in [place].  He states he became cool, sweaty, clammy, could not breathe, which lasted only for few minutes.  He states he sat in air conditioning and this resolved.  State he had a subsequent episode at home he woke up with heaviness in his chest and hard to breathe with neck pain.  He took 4 baby aspirin and 1 nitroglycerin.  He states the first episode happened on 07/01/2021.  The second episode occurred this prior Monday.  He is concerned it may be his heart.  We discussed possible Lexiscan stress test.  He states he is scared to undergo stress test due to discussion for people who have had Lexiscan stress test in the past having side effects.  He states he has had no more issues since last Monday.  He states that this was a social event on the first occasion and he was wearing a tight shirt and tight vest  and was in a stressful situation which may have precipitated the symptoms.  He states during the second event he was at home with no stressful precipitating event.   Past Medical History:  Diagnosis Date   Asthma    CAD (coronary artery disease)    cath 09/09/2015 20% prox RCA, 80% prox to mid LCx treated with DES (3.518 mm Xience Alpine DES), 80% mid LAD lesion treated with 3.518 mm Xience Alpine DES stent postdilated to 4.2.    Chest pain    a. negative stress echo August 2011   GERD (gastroesophageal reflux disease)    High cholesterol    History of pneumonia    a. 2014.   Hypothyroidism    Morbid obesity (HCC)    Tobacco abuse     Past Surgical History:  Procedure Laterality Date   CARDIAC CATHETERIZATION N/A 09/09/2015   Procedure: Left Heart Cath and Coronary Angiography;  Surgeon: Lennette Bihari, MD;  Location: [facility] INVASIVE CV LAB;  Service: Cardiovascular;  Laterality: N/A;   CARDIAC CATHETERIZATION  09/09/2015   Procedure: Coronary Stent Intervention;  Surgeon: Lennette Bihari, MD;  Location: [facility] INVASIVE CV LAB;  Service: Cardiovascular;;   lymph gland removal     MANDIBLE SURGERY     broken jaw    Current Outpatient Medications  Medication Sig Dispense Refill   albuterol (PROVENTIL) (2.5 MG/3ML) 0.083% nebulizer solution Take 2.5 mg by nebulization every 6 (six) hours as needed for wheezing or shortness of breath.     albuterol (VENTOLIN HFA) 108 (90 Base) MCG/ACT inhaler Inhale 2 puffs into the lungs every 6 (six) hours as needed for wheezing or shortness of breath.     aspirin 81 MG tablet Take 81 mg by mouth every evening.     atorvastatin (LIPITOR) 80 MG tablet Take 1 tablet by mouth daily.     Budeson-Glycopyrrol-Formoterol (BREZTRI AEROSPHERE) 160-9-4.8 MCG/ACT AERO Inhale 2 puffs into the lungs 2 (two) times daily. 10.7 g 11   Cholecalciferol (VITAMIN D3 PO) Take 1 tablet by mouth daily.     EUTHYROX 150 MCG tablet Take 150 mcg by mouth daily.     ezetimibe (ZETIA) 10 MG tablet Take 1 tablet (10 mg total) by mouth daily. 30 tablet 6   furosemide (LASIX) 20 MG tablet Take 1 tablet by mouth once daily 90 tablet 1   metoprolol tartrate (LOPRESSOR) 25 MG tablet TAKE 1/2 (ONE-HALF) TABLET BY MOUTH TWICE DAILY WITH FOOD 90 tablet 2   Multiple Vitamins-Minerals (CENTRUM FRESH/FRUITY 50+) CHEW Chew 1 tablet by mouth daily.     nitroGLYCERIN (NITROSTAT) 0.4 MG SL tablet PLACE 1 TABLET UNDER THE  TONGUE EVERY 5 MIN UP TO 3 TIMES AS NEEDED FOR CHEST PAIN 25 tablet 3   omeprazole (PRILOSEC) 40 MG capsule Take 1 capsule (40 mg total) by mouth daily. 30 capsule 11   No current facility-administered medications for this visit.   Allergies:  Penicillins   Social History: The patient  reports that he quit smoking about 5 months ago. His smoking use included cigarettes. He started smoking about 45 years ago. He has a 32.00 pack-year smoking history. He has never used smokeless tobacco. He reports current drug use. Frequency: 1.00 time per week. He reports that he does not drink alcohol.   Family History: The patient's family history includes Heart attack in his father; Lung cancer in his mother.   ROS:  Please see the history of present illness. Otherwise, complete review of systems is positive  for none.  All other systems are reviewed and negative.   Physical Exam: VS:  BP 118/76   Pulse 70   Ht 5\' 10"  (1.778 m)   Wt 229 lb (103.9 kg)   SpO2 96%   BMI 32.86 kg/m , BMI Body mass index is 32.86 kg/m.  Wt Readings from Last 3 Encounters:  07/10/21 229 lb (103.9 kg)  03/27/21 234 lb (106.1 kg)  03/17/21 234 lb 9.6 oz (106.4 kg)    General: Patient appears comfortable at rest. Neck: Supple, no elevated JVP or carotid bruits, no thyromegaly. Lungs: Mild inspiratory and expiratory wheezing noted, nonlabored breathing at rest. Cardiac: Regular rate and rhythm, no S3 or significant systolic murmur, no pericardial rub. Extremities: No pitting edema, distal pulses 2+. Skin: Warm and dry. Musculoskeletal: No kyphosis. Neuropsychiatric: Alert and oriented x3, affect grossly appropriate.  ECG: 07/10/2021 EKG normal sinus rhythm rate of 64, nonspecific T wave abnormality.  Recent Labwork: 08/11/2020: BUN 10; Creatinine, Ser 1.22; Hemoglobin 14.7; Platelets 147; Potassium 2.7; Sodium 137     Component Value Date/Time   CHOL 224 (H) 09/09/2015 0239   TRIG 308 (H) 09/09/2015 0239   HDL 24  (L) 09/09/2015 0239   CHOLHDL 9.3 09/09/2015 0239   VLDL 62 (H) 09/09/2015 0239   LDLCALC 138 (H) 09/09/2015 0239    Other Studies Reviewed Today:    Echocardiogram 03/23/2021  1. Left ventricular ejection fraction, by estimation, is approximately 55%. The left ventricle has normal function. Left ventricular endocardial border not optimally defined to evaluate regional wall motion. Left ventricular diastolic parameters are indeterminate. Consider Definity contrast with future studies. 2. Right ventricular systolic function is normal. The right ventricular size is normal. Tricuspid regurgitation signal is inadequate for assessing PA pressure. 3. The mitral valve is grossly normal. Trivial mitral valve regurgitation. 4. The aortic valve was not well visualized. Aortic valve regurgitation is not visualized. 5. The inferior vena cava is normal in size with greater than 50% respiratory variability, suggesting right atrial pressure of 3 mmHg. Comparison(s): Previous Echo showed LV EF 55-60%, mild LVH, possible milk HK of the apicalanteroseptal myocardium.   Echocardiogram 10/12/2015 Study Conclusions   - Left ventricle: The cavity size was normal. Wall thickness was    increased in a pattern of mild LVH. Systolic function was normal.    The estimated ejection fraction was in the range of 55% to 60%.    Possible mild hypokinesis of the apicalanteroseptal myocardium.    Left ventricular diastolic function parameters were normal for    the patient&'s age.  - Mitral valve: Calcified annulus. There was trivial regurgitation.  - Right atrium: Central venous pressure (est): 3 mm Hg.  - Atrial septum: No defect or patent foramen ovale was identified.  - Tricuspid valve: There was physiologic regurgitation.  - Pulmonary arteries: Systolic pressure could not be accurately    estimated.  - Pericardium, extracardiac: There was no pericardial effusion.   Impressions:   - Mild LVH with LVEF  55-60%, possible mild hypokinesis of the    distal anteroseptal wall noted. Grossly normal diastolic    function. Trivial mitral regurgitation.   Cardiac catheterization 09/09/2015 Conclusion  Prox RCA lesion, 20% stenosed. Prox Cx to Mid Cx lesion, 80% stenosed. Post intervention, there is a 0% residual stenosis. Mid LAD lesion, 80% stenosed. Post intervention, there is a 0% residual stenosis. There is mild left ventricular systolic dysfunction.   Mild LV dysfunction with a global ejection fraction at 45-50% with mild mid  anterolateral and mid inferior hypocontractility.   Significant 2 vessel CAD with 80% eccentric stenosis in the proximal LAD with initial TIMI 2 flow; normal ramus intermediate vessel; 80% hazy proximal left circumflex stenosis; and irregularity in the RCA with 20% proximal stenosis.   Successful two-vessel intervention with PTCA/DES stenting with a 3.518 mm Xience Alpine DES stent in the proximal left circumflex coronary artery, postdilated to 3.72 mm in the stenosis being reduced to 0%; and successful PCI to the proximal LAD with ultimate insertion of a 3.518 mm Xience Alpine DES stent postdilated to 4.2, tapering to 4.12 mm with initial TIMI 2 flow improving to TIMI-3 flow and the stenosis being reduced to 0%.   RECOMMENDATION: The patient will continue on dual antiplatelet therapy for minimum of a year.  Aggressive lipid management, medical therapy for CAD, will be employed.  Smoking sensation was strongly advised.     Diagnostic Dominance: Right    Intervention     Implants         Assessment and Plan:  1. CAD in native artery   2. Hyperlipidemia LDL goal <70   3. Chronic obstructive pulmonary disease, unspecified COPD type (HCC)   4. Morbid obesity (HCC)   5. DOE (dyspnea on exertion)      1. CAD in native artery  Recent episode of sudden onset of sensation of not being able to breathe well, cool clammy..  States he sat and air conditioned  for a while and symptoms were relieved.  Last Monday he woke up with heaviness in his chest with sensation of hard to breathe, neck pain.  He states that his symptoms felt similar to symptoms he had prior to previous stents.  We discussed a Lexiscan stress test.  He is reluctant to proceed due to friends telling him that he had significant side effects after undergoing Lexiscan stress test.  He wants to defer for now and think about it.  Start Imdur 15 mg daily.  Continue aspirin 81 mg daily, nitroglycerin sublingual as needed for chest pain.  Continue metoprolol 25 mg 1/2 tablet by mouth p.o. twice daily.  2. Hyperlipidemia LDL goal <70  Continue atorvastatin 80 mg p.o. daily.  States he had recent lab work at PCP office.  We will attempt to obtain those results. Recent lipid panel on 12/13/2020 from PCP office showed TC 205, triglyceride 475, HDL 30, LDL 146.  We ordered repeat FLP's and LFTs on 03/10/2021.  Please obtain labs from recent PCP visit.  3. Chronic obstructive pulmonary disease, unspecified COPD type (HCC) History of tobacco abuse and COPD.  He had chronic dyspnea related to COPD.  He states he had a recent CT scan at [facility] showing evidence of emphysema.  Deviously saw Dr. [name] pulmonology who started him on Breztri inhaler.  States he is using his rescue inhaler much less since starting [place].  His breathing had improved since starting [place].  4. Morbid obesity (HCC) At last visit he stated he wanted to get back to exercising but was limited by back pain and umbilical hernias.   5.  DOE He is following with pulmonology who started him on Breztri and states it has improved his breathing.  He states he is relying less on his rescue inhaler since starting this medication.  Recent echocardiogram showed EF of 55% and trivial MR on 03/23/2021  Medication Adjustments/Labs and Tests Ordered: Current medicines are reviewed at length with the patient today.  Concerns regarding medicines are  outlined above.  Disposition: Follow-up with Dr. Wyline Mood or APP 6 months  Signed, [name], NP 07/10/2021 8:38 AM    [facility] at [facility] [address] [place], [place], [place] [address] Phone: [phone]; Fax: [phone]

## 2021-07-10 ENCOUNTER — Ambulatory Visit (INDEPENDENT_AMBULATORY_CARE_PROVIDER_SITE_OTHER): Payer: Medicaid Other | Admitting: Family Medicine

## 2021-07-10 ENCOUNTER — Encounter: Payer: Self-pay | Admitting: Family Medicine

## 2021-07-10 VITALS — BP 118/76 | HR 70 | Ht 70.0 in | Wt 229.0 lb

## 2021-07-10 DIAGNOSIS — I2510 Atherosclerotic heart disease of native coronary artery without angina pectoris: Secondary | ICD-10-CM

## 2021-07-10 DIAGNOSIS — J449 Chronic obstructive pulmonary disease, unspecified: Secondary | ICD-10-CM | POA: Diagnosis not present

## 2021-07-10 DIAGNOSIS — E785 Hyperlipidemia, unspecified: Secondary | ICD-10-CM | POA: Diagnosis not present

## 2021-07-10 DIAGNOSIS — R0600 Dyspnea, unspecified: Secondary | ICD-10-CM | POA: Diagnosis not present

## 2021-07-10 DIAGNOSIS — R0609 Other forms of dyspnea: Secondary | ICD-10-CM

## 2021-07-10 MED ORDER — ISOSORBIDE MONONITRATE ER 30 MG PO TB24: 15.0000 mg | ORAL_TABLET | Freq: Every day | ORAL | 3 refills | Status: DC

## 2021-07-10 NOTE — Patient Instructions (Signed)
Medication Instructions:  Your physician has recommended you make the following change in your medication:  START Imdur 15 mg tablets daily  *If you need a refill on your cardiac medications before your next appointment, please call your pharmacy*   Lab Work: None If you have labs (blood work) drawn today and your tests are completely normal, you will receive your results only by: MyChart Message (if you have MyChart) OR A paper copy in the mail If you have any lab test that is abnormal or we need to change your treatment, we will call you to review the results.   Testing/Procedures: None   Follow-Up: At [facility], you and your health needs are our priority.  As part of our continuing mission to provide you with exceptional heart care, we have created designated Provider Care Teams.  These Care Teams include your primary Cardiologist (physician) and Advanced Practice Providers (APPs -  Physician Assistants and Nurse Practitioners) who all work together to provide you with the care you need, when you need it.  We recommend signing up for the patient portal called "MyChart".  Sign up information is provided on this After Visit Summary.  MyChart is used to connect with patients for Virtual Visits (Telemedicine).  Patients are able to view lab/test results, encounter notes, upcoming appointments, etc.  Non-urgent messages can be sent to your provider as well.   To learn more about what you can do with MyChart, go to [email or web address].    Your next appointment:   6 month(s)  The format for your next appointment:   In Person  Provider:   Nena Polio, NP   Other Instructions

## 2021-07-24 ENCOUNTER — Telehealth: Payer: Self-pay | Admitting: Family Medicine

## 2021-07-24 ENCOUNTER — Other Ambulatory Visit: Payer: Self-pay | Admitting: Family Medicine

## 2021-07-24 NOTE — Telephone Encounter (Signed)
Patient called stating that since he started taking isosorbide 30 mg he wakes up with an excruciating headache.  He is wanting to know if he can take something for the headaches that will not interfere with heart medication.

## 2021-07-25 NOTE — Telephone Encounter (Signed)
Reports starting imdur 15 mg daily in the morning on 07/21/2021. Reports headache started after 1st dose. Advised that this is a common side effect and usually improves after several days of taking it. Reports taking Excedrin migraine and headache resolved. Advised to continue if tolerable and if headache continues, to stop it and notify our office if he does so. Verbalized understanding of plan.

## 2021-08-14 ENCOUNTER — Encounter: Payer: Self-pay | Admitting: Internal Medicine

## 2021-08-14 ENCOUNTER — Other Ambulatory Visit: Payer: Self-pay

## 2021-08-14 ENCOUNTER — Ambulatory Visit (INDEPENDENT_AMBULATORY_CARE_PROVIDER_SITE_OTHER): Payer: Medicaid Other | Admitting: Internal Medicine

## 2021-08-14 DIAGNOSIS — J449 Chronic obstructive pulmonary disease, unspecified: Secondary | ICD-10-CM

## 2021-08-14 NOTE — Progress Notes (Signed)
James Huynh, male    DOB: [birth year]   MRN: [id number]   Brief patient profile:  60  yowm  Quit smoking (and inhaling) cigars  01/2021 p already stopped cigs in 1992 due to  dx of  asthma rx albuterol and more albuterol up to 4 inhalers  per month and much better after a week of prednisone referred to pulmonary clinic in [facility]  02/13/2021 by Levell July, PA  Born at [facility] with "need for trach before age [age]" but "parents refused" then subsequenly  no problem age 60 then episodic  facial swelling  and difficulty breathing eval by "[facility]"  allergist in [place] > allergic to everything"  took shots until age 60 still problems in spring mostly rhinitis not asthma until 1992.       History of Present Illness  02/13/2021  Pulmonary/ 1st office eval/ Moriyah [facility] / [place] [facility]  Chief Complaint  Patient presents with   Pulmonary Consult    Referred by Levell July, PA. Pt c/o SOB for the past 2 years worse over the past 2 wks. Pt states that he always feels like he is unable to take a deep enough breath, feels better when he yawns. He is using his albuterol several times per day.   Dyspnea: limited by R radicular back pain > sob  Cough: sporadic /no rhinitis at initial eval or noct cough  Sleep: no problem noct lying flat SABA use: way too much  rec Plan A = Automatic = Always=    Symbicort 160 Take 2 puffs first thing in am and then another 2 puffs about 12 hours later.  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   OR STIOLTO 2 puffs in am  Work on inhaler technique:   Plan B = Backup (to supplement plan A, not to replace it) Only use your albuterol inhaler as a rescue medication Plan C = Crisis (instead of Plan B but only if Plan B stops working) - only use your albuterol nebulizer if you first try Plan B and it fails to help  Ok to try Try albuterol 15 min before an activity (on alternating days use inhaler then nebulizer then nothing)  that you know would make you short of breath  Add needs allergy profile and alpha one     08/14/2021  f/u ov/[place] office/Eryck Negron re: GOLD 2 vs ACOS  maint on breztri 2bid    Chief Complaint  Patient presents with   Follow-up    SOB and cough are about the same since last OV.   PFT- status needs scheduling.    Dyspnea:  room to room gives out bending over/  Cough: clear / mornings ok / assoc variable nasal congestion Sleeping: bed is flat/ on side one bed  SABA use: 1-2 inhalers per inhaler  02: no  Covid status: never vax/ / infected Oct 2021  Lung cancer screening: ordered    No obvious day to day or daytime variability or assoc excess/ purulent sputum or mucus plugs or hemoptysis or cp or chest tightness, subjective wheeze or overt  hb symptoms.   Sleeping  without nocturnal  or early am exacerbation  of respiratory  c/o's or need for noct saba. Also denies any obvious fluctuation of symptoms with weather or environmental changes or other aggravating or alleviating factors except as outlined above   No unusual exposure hx or h/o childhood  pna/ asthma or knowledge of premature birth.  Current Allergies, Complete Past Medical History, Past Surgical History, Family History, and Social History were reviewed in [employer] record.  ROS  The following are not active complaints unless bolded Hoarseness, sore throat, dysphagia, dental problems, itching, sneezing,  nasal congestion or discharge of excess mucus or purulent secretions, ear ache,   fever, chills, sweats, unintended wt loss or wt gain, classically pleuritic or exertional cp,  orthopnea pnd or arm/hand swelling  or leg swelling, presyncope, palpitations, abdominal pain, anorexia, nausea, vomiting, diarrhea  or change in bowel habits or change in bladder habits, change in stools or change in urine, dysuria, hematuria,  rash, arthralgias, visual complaints, headache, numbness, weakness or ataxia or problems with walking or coordination,  change in mood or  memory.        Current Meds  Medication Sig   albuterol (PROVENTIL) (2.5 MG/3ML) 0.083% nebulizer solution Take 2.5 mg by nebulization every 6 (six) hours as needed for wheezing or shortness of breath.   albuterol (VENTOLIN HFA) 108 (90 Base) MCG/ACT inhaler Inhale 2 puffs into the lungs every 6 (six) hours as needed for wheezing or shortness of breath.   aspirin 81 MG tablet Take 81 mg by mouth every evening.   atorvastatin (LIPITOR) 80 MG tablet Take 1 tablet by mouth daily.   Budeson-Glycopyrrol-Formoterol (BREZTRI AEROSPHERE) 160-9-4.8 MCG/ACT AERO Inhale 2 puffs into the lungs 2 (two) times daily.   Cholecalciferol (VITAMIN D3 PO) Take 1 tablet by mouth daily.   EUTHYROX 150 MCG tablet Take 150 mcg by mouth daily.   furosemide (LASIX) 20 MG tablet Take 1 tablet by mouth once daily   isosorbide mononitrate (IMDUR) 30 MG 24 hr tablet Take 0.5 tablets (15 mg total) by mouth daily.   metoprolol tartrate (LOPRESSOR) 25 MG tablet TAKE 1/2 (ONE-HALF) TABLET BY MOUTH TWICE DAILY WITH FOOD   nitroGLYCERIN  (NITROSTAT) 0.4 MG SL tablet PLACE 1 TABLET UNDER THE TONGUE EVERY 5 MIN UP TO 3 TIMES AS NEEDED FOR CHEST PAIN   omeprazole (PRILOSEC) 40 MG capsule Take 1 capsule (40 mg total) by mouth daily.              Past Medical History:  Diagnosis Date   Asthma    CAD (coronary artery disease)    cath 09/09/2015 20% prox RCA, 80% prox to mid LCx treated with DES (3.518 mm Xience Alpine DES), 80% mid LAD lesion treated with 3.518  mm Xience Alpine DES stent postdilated to 4.2.   Chest pain    a. negative stress echo August 2011   GERD (gastroesophageal reflux disease)    High cholesterol    History of pneumonia    a. 2014.   Hypothyroidism    Morbid obesity (HCC)    Tobacco abuse       Objective:     08/14/2021     230   03/17/21 234 lb 9.6 oz (106.4 kg)  02/13/21 234 lb (106.1 kg)  01/17/21 236 lb 3.2 oz (107.1 kg)    Vital signs reviewed  08/14/2021  - Note at rest 02 sats  97% on RA   General appearance:    mildly obese amb wm nad     HEENT : pt wearing mask not removed for exam due to covid - 19 concerns.    NECK :  without JVD/Nodes/TM/ nl carotid upstrokes bilaterally   LUNGS: no acc muscle use,  Mild barrel  contour chest wall with bilateral  Distant bs s audible wheeze and  without cough on insp or exp maneuvers  and mild  Hyperresonant  to  percussion bilaterally     CV:  RRR  no s3 or murmur or increase in P2, and no edema   ABD:  soft and nontender with pos end  insp Hoover's  in the supine position. No bruits or organomegaly appreciated, bowel sounds nl  MS:   Nl gait/  ext warm without deformities, calf tenderness, cyanosis or clubbing No obvious joint restrictions   SKIN: warm and dry without lesions    NEURO:  alert, approp, nl sensorium with  no motor or cerebellar deficits apparent.       Labs ordered 08/14/2021  :  allergy profile   alpha one AT phenotype         Assessment

## 2021-08-14 NOTE — Assessment & Plan Note (Addendum)
Quit smoking 01/2021 -  H/o childhood allergies "to everything" on allergy shots age 60 -38  - PFT's 05/08/18  FEV1 2.44 (65 % ) ratio 0.58  p 7 % improvement from saba p ? saba hfa and neb prior to study with DLCO  21.84 (67%) corrects to 3.55 (76%)  for alv volume and FV curve classic mild/mod concavity plus  ERV 35% at wt 231  - 02/13/2021  After extensive coaching inhaler device,  effectiveness =    75% (short ti) > try symbicort 160 2bid and if fails > change to breztri  - 03/17/2021  After extensive coaching inhaler device,  effectiveness =  75%> try   Breztri  2 bid and if not happy with benefits vs cost then back to stiolto 2 pffs each am  -  03/17/2021   Walked RA  approx   300 ft  @ moderate pace  stopped due end of study with sats 98%    - Labs ordered 08/14/2021  :  allergy profile   alpha one AT phenotype   - 08/14/2021  After extensive coaching inhaler device,  effectiveness =    80% (short Ti)  - 08/14/2021   Walked on RA x  3  lap(s) =  approx 47f @ mod to fast pace, stopped due to end of study, mild sob  with lowest 02 sats 95% at lowest     Group D in terms of symptom/risk and laba/lama/ICS  therefore appropriate rx at this point >>>  breztri plus approp saba (still using too much)  Re SABA :  I spent extra time with pt today reviewing appropriate use of albuterol for prn use on exertion with the following points: 1) saba is for relief of sob that does not improve by walking a slower pace or resting but rather if the pt does not improve after trying this first. 2) If the pt is convinced, as many are, that saba helps recover from activity faster then it's easy to tell if this is the case by re-challenging : ie stop, take the inhaler, then p 5 minutes try the exact same activity (intensity of workload) that just caused the symptoms and see if they are substantially diminished or not after saba 3) if there is an activity that reproducibly causes the symptoms, try the saba 15 min before the  activity on alternate days   If in fact the saba really does help, then fine to continue to use it prn but advised may need to look closer at the maintenance regimen being used to achieve better control of airways disease with exertion.   >>> regular paced ex to 30 min daily if possible    F/u in 6 m, call sooner if needed    Each maintenance medication was reviewed in detail including emphasizing most importantly the difference between maintenance and prns and under what circumstances the prns are to be triggered using an action plan format where appropriate.  Total time for H and P, chart review, counseling, reviewing hfa device(s) , directly observing portions of ambulatory 02 saturation study/ and generating customized AVS unique to this office visit / same day charting  > 30 min

## 2021-08-14 NOTE — Patient Instructions (Addendum)
We are referring you for our lung cancer screening program   Work on inhaler technique:  relax and gently blow all the way out then take a nice smooth full deep breath back in, triggering the inhaler at same time you start breathing in.  Hold for up to 5 seconds if you can. Blow brezt  ri out thru nose. Rinse and gargle with water when done.  If mouth or throat bother you at all,  try brushing teeth/gums/tongue with arm and hammer toothpaste/ make a slurry and gargle and spit out.       Ok to try albuterol 15 min before an activity (on alternating days with inhaler then nebulizer then nothing )  that you know would usually make you short of breath and see if it makes any difference and if makes none then don't take albuterol after activity unless you can't catch your breath as this means it's the resting that helps, not the albuterol.   Please schedule a follow up visit in 6  months but call sooner if needed

## 2021-08-14 NOTE — Addendum Note (Signed)
Addended by: Carleene Mains D on: 08/14/2021 10:28 AM   Modules accepted: Orders

## 2021-08-22 LAB — CBC WITH DIFFERENTIAL/PLATELET
Basophils Absolute: 0.1 10*3/uL (ref 0.0–0.2)
Basos: 1 %
EOS (ABSOLUTE): 0.1 10*3/uL (ref 0.0–0.4)
Eos: 2 %
Hematocrit: 42.3 % (ref 37.5–51.0)
Hemoglobin: 14.5 g/dL (ref 13.0–17.7)
Immature Grans (Abs): 0 10*3/uL (ref 0.0–0.1)
Immature Granulocytes: 0 %
Lymphocytes Absolute: 3.5 10*3/uL — ABNORMAL HIGH (ref 0.7–3.1)
Lymphs: 41 %
MCH: 31.8 pg (ref 26.6–33.0)
MCHC: 34.3 g/dL (ref 31.5–35.7)
MCV: 93 fL (ref 79–97)
Monocytes Absolute: 0.7 10*3/uL (ref 0.1–0.9)
Monocytes: 8 %
Neutrophils Absolute: 4.2 10*3/uL (ref 1.4–7.0)
Neutrophils: 48 %
Platelets: 244 10*3/uL (ref 150–450)
RBC: 4.56 x10E6/uL (ref 4.14–5.80)
RDW: 12.8 % (ref 11.6–15.4)
WBC: 8.7 10*3/uL (ref 3.4–10.8)

## 2021-08-22 LAB — ALPHA-1-ANTITRYPSIN PHENOTYP: A-1 Antitrypsin: 141 mg/dL (ref 101–187)

## 2021-08-22 LAB — IGE: IgE (Immunoglobulin E), Serum: 103 IU/mL (ref 6–495)

## 2021-08-22 NOTE — Telephone Encounter (Signed)
MW please advise on the my chart message from the pt.  thanks 

## 2021-09-14 ENCOUNTER — Other Ambulatory Visit: Payer: Self-pay | Admitting: Family Medicine

## 2021-09-28 ENCOUNTER — Other Ambulatory Visit: Payer: Self-pay

## 2021-09-28 DIAGNOSIS — F17210 Nicotine dependence, cigarettes, uncomplicated: Secondary | ICD-10-CM

## 2021-09-28 DIAGNOSIS — Z87891 Personal history of nicotine dependence: Secondary | ICD-10-CM

## 2021-10-26 IMAGING — DX DG CHEST 1V PORT
1 series · 1 of 1 positions shown · non-contrast
Comparison: 01/13/2020

CLINICAL DATA: Shortness of breath COVID exposure

EXAM:
PORTABLE CHEST 1 VIEW

[chest ap]
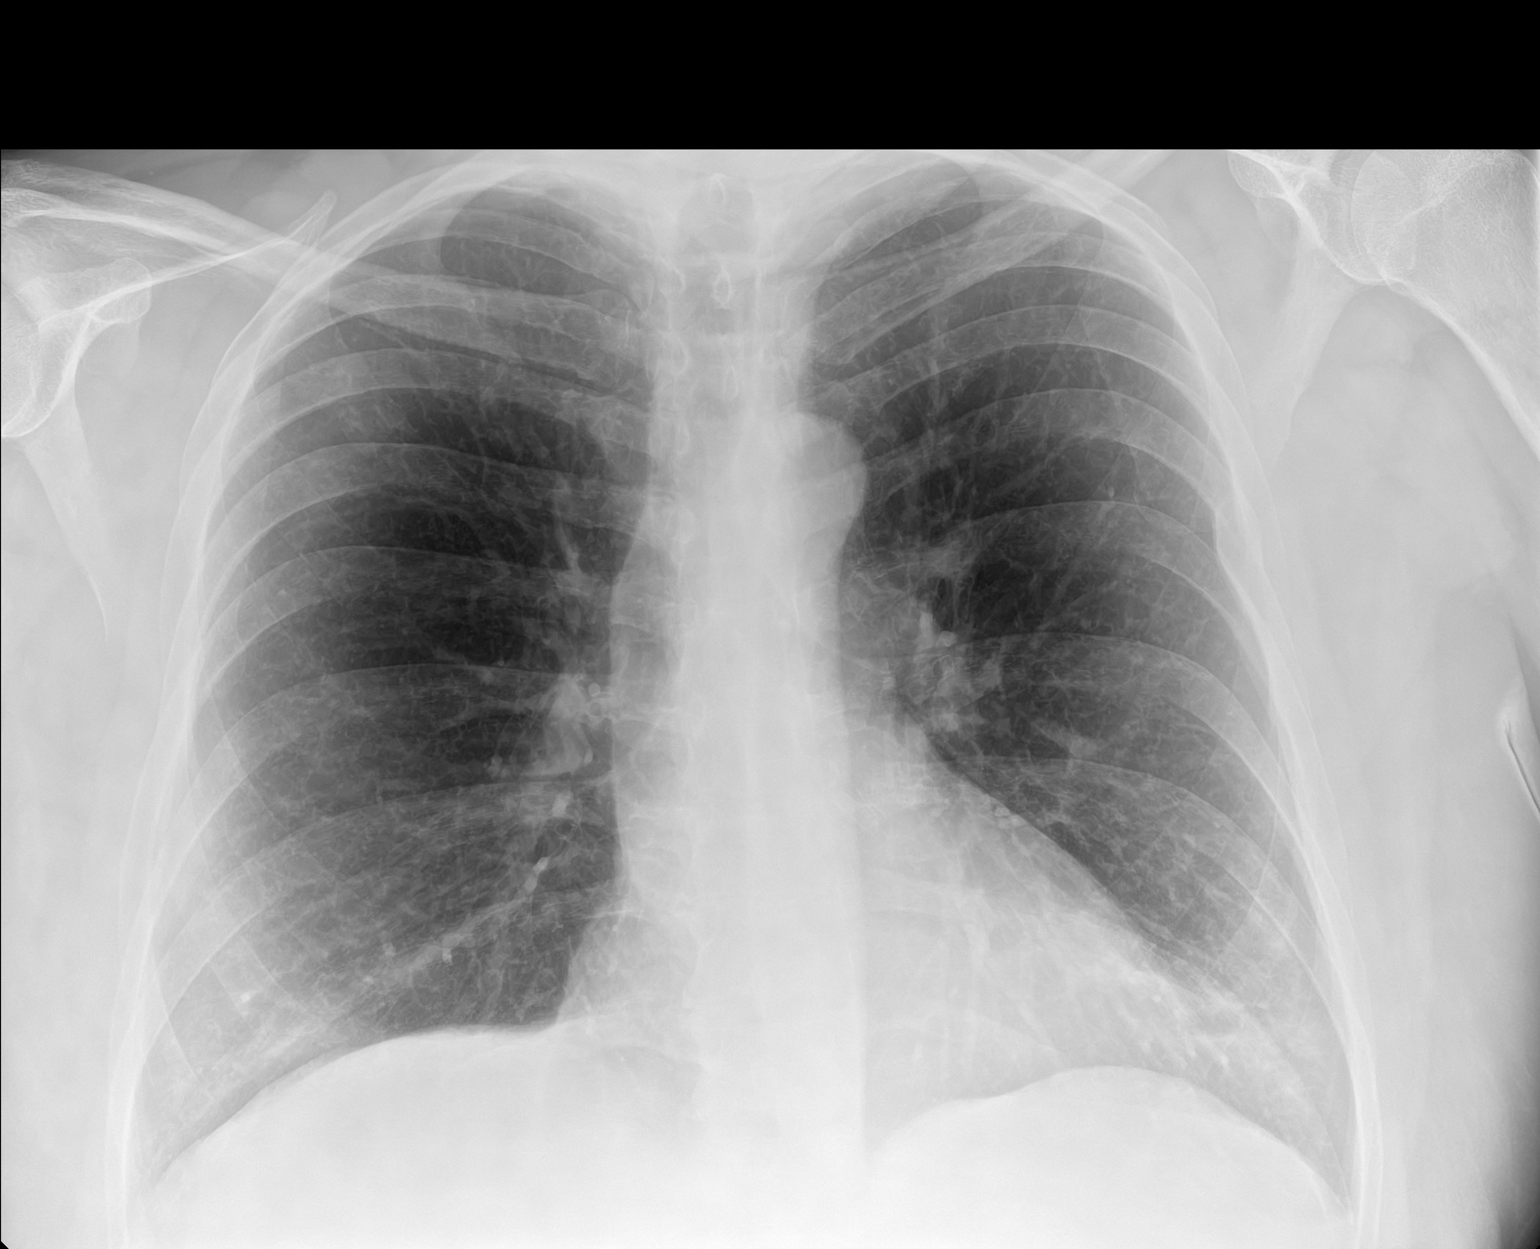

[1 of 1 positions shown; findings below may reference images not displayed]

FINDINGS: The heart size and mediastinal contours are within normal limits.
Both lungs are clear. Old left upper rib fracture.
IMPRESSION: No active disease.

## 2021-11-03 ENCOUNTER — Ambulatory Visit (INDEPENDENT_AMBULATORY_CARE_PROVIDER_SITE_OTHER): Payer: Medicaid Other | Admitting: Acute Care

## 2021-11-03 ENCOUNTER — Encounter: Payer: Self-pay | Admitting: Acute Care

## 2021-11-03 ENCOUNTER — Other Ambulatory Visit: Payer: Self-pay

## 2021-11-03 DIAGNOSIS — Z87891 Personal history of nicotine dependence: Secondary | ICD-10-CM

## 2021-11-03 NOTE — Patient Instructions (Signed)
Thank you for participating in the [facility] Scre[facility]. °It was our pleasure to meet you today. °We will call you with the results of your scan within the next few days. °Your scan will be assigned a Lung RADS category score by the physicians reading the scans.  °This Lung RADS score determines follow up scanning.  °See below for description of categories, and follow up screening recommendations. °We will be in touch to schedule your follow up screening annually or based on recommendations of our providers. °We will fax a copy of your scan results to your Primary Care Physician, or the physician who referred you to the program, to ensure they have the results. °Please call the office if you have any questions or concerns regarding your scanning experience or results.  °Our office number is [phone]. °Please speak with Denise Phelps, RN. She is our Lung Cancer Screening RN. °If she is unavailable when you call, please have the office staff send her a message. She will return your call at her earliest convenience. °Remember, if your scan is normal, we will scan you annually as long as you continue to meet the criteria for the program. (Age 55-77, Current smoker or smoker who has quit within the last 15 years). °If you are a smoker, remember, quitting is the single most powerful action that you can take to decrease your risk of lung cancer and other pulmonary, breathing related problems. °We know quitting is hard, and we are here to help.  °Please let us know if there is anything we can do to help you meet your goal of quitting. °If you are a former smoker, congratulations. We are proud of you! Remain smoke free! °Remember you can refer friends or family members through the number above.  °We will screen them to make sure they meet criteria for the program. °Thank you for helping us take better care of you by participating in Lung Screening. ° °You can receive free nicotine replacement therapy  ( patches, gum or mints) by calling 1-800-QUIT NOW. Please call so we can get you on the path to becoming  a non-smoker. I know it is hard, but you can do this! ° °Lung RADS Categories: ° °Lung RADS 1: no nodules or definitely non-concerning nodules.  °Recommendation is for a repeat annual scan in 12 months. ° °Lung RADS 2:  nodules that are non-concerning in appearance and behavior with a very low likelihood of becoming an active cancer. °Recommendation is for a repeat annual scan in 12 months. ° °Lung RADS 3: nodules that are probably non-concerning , includes nodules with a low likelihood of becoming an active cancer.  Recommendation is for a 6-month repeat screening scan. Often noted after an upper respiratory illness. We will be in touch to make sure you have no questions, and to schedule your 6-month scan. ° °Lung RADS 4 A: nodules with concerning findings, recommendation is most often for a follow up scan in 3 months or additional testing based on our provider's assessment of the scan. We will be in touch to make sure you have no questions and to schedule the recommended 3 month follow up scan. ° °Lung RADS 4 B:  indicates findings that are concerning. We will be in touch with you to schedule additional diagnostic testing based on our provider's  assessment of the scan. ° °Hypnosis for smoking cessation  °[employer] °[phone] ° °Acupuncture for smoking cessation  °[facility] °[phone]  °

## 2021-11-03 NOTE — Progress Notes (Addendum)
Virtual Visit via Telephone Note  I connected with James Huynh on 11/03/21 at  2:00 PM EST by telephone and verified that I am speaking with the correct person using two identifiers.  Location: Patient: At home Provider: [address], [place], [place], [address]    I discussed the limitations, risks, security and privacy concerns of performing an evaluation and management service by telephone and the availability of in person appointments. I also discussed with the patient that there may be a patient responsible charge related to this service. The patient expressed understanding and agreed to proceed.    Shared Decision Making Visit Lung Cancer Screening Program [phone])   Eligibility: Age 61 y.o. Pack Years Smoking History Calculation 32 pack year smoking history (# packs/per year x # years smoked) Recent History of coughing up blood  no Unexplained weight loss? no ( >Than 15 pounds within the last 6 months ) Prior History Lung / other cancer no (Diagnosis within the last 5 years already requiring surveillance chest CT Scans). Smoking Status Former Smoker Former Smokers: Years since quit: < 1 year  Quit Date: 01/20/2021  Visit Components: Discussion included one or more decision making aids. yes Discussion included risk/benefits of screening. yes Discussion included potential follow up diagnostic testing for abnormal scans. yes Discussion included meaning and risk of over diagnosis. yes Discussion included meaning and risk of False Positives. yes Discussion included meaning of total radiation exposure. yes  Counseling Included: Importance of adherence to annual lung cancer LDCT screening. yes Impact of comorbidities on ability to participate in the program. yes Ability and willingness to under diagnostic treatment. yes  Smoking Cessation Counseling: Current Smokers:  Discussed importance of smoking cessation. yes Information about tobacco cessation classes and  interventions provided to patient. yes Patient provided with "ticket" for LDCT Scan. yes Symptomatic Patient. no  Counseling NA Diagnosis Code: Tobacco Use Z72.0 Asymptomatic Patient yes  Counseling (Intermediate counseling: > three minutes counseling) M5465 Former Smokers:  Discussed the importance of maintaining cigarette abstinence. yes Diagnosis Code: Personal History of Nicotine Dependence. K35.465 Information about tobacco cessation classes and interventions provided to patient. Yes Patient provided with "ticket" for LDCT Scan. yes Written Order for Lung Cancer Screening with LDCT placed in Epic. Yes (CT Chest Lung Cancer Screening Low Dose W/O CM) [id number] Z12.2-Screening of respiratory organs Z87.891-Personal history of nicotine dependence  I spent 25 minutes of face to face time/virtual visit time  with  James Huynh discussing the risks and benefits of lung cancer screening. We took the time to pause the power point at intervals to allow for questions to be asked and answered to ensure understanding. We discussed that he had taken the single most powerful action possible to decrease his risk of developing lung cancer when he quit smoking. I counseled him to remain smoke free, and to contact me if he ever had the desire to smoke again so that I can provide resources and tools to help support the effort to remain smoke free. We discussed the time and location of the scan, and that either  Abigail Miyamoto RN, Karlton Lemon, RN or I  or I will call / send a letter with the results within  24-72 hours of receiving them. He has the office contact information in the event he needs to speak with me,  he verbalized understanding of all of the above and had no further questions upon leaving the office.     I explained to the patient that there  has been a high incidence of coronary artery disease noted on these exams. I explained that this is a non-gated exam therefore degree or severity cannot be  determined. This patient is on statin therapy. I have asked the patient to follow-up with their PCP regarding any incidental finding of coronary artery disease and management with diet or medication as they feel is clinically indicated. The patient verbalized understanding of the above and had no further questions.  Pt. Was exposed to Covid a few days ago. We will reschedule his CT Scan that was scheduled for 11/06/2021 until we know he does not become symptomatic and test positive .      Bevelyn Ngo, NP 11/03/2021 2:21 PM

## 2021-11-06 ENCOUNTER — Ambulatory Visit (HOSPITAL_COMMUNITY): Payer: Medicaid Other

## 2021-12-04 ENCOUNTER — Ambulatory Visit (HOSPITAL_COMMUNITY)
Admission: RE | Admit: 2021-12-04 | Discharge: 2021-12-04 | Disposition: A | Discharge status: Home / Self Care | Payer: Medicaid Other | Source: Ambulatory Visit | Attending: Acute Care | Admitting: Acute Care

## 2021-12-04 ENCOUNTER — Other Ambulatory Visit: Payer: Self-pay

## 2021-12-04 DIAGNOSIS — Z87891 Personal history of nicotine dependence: Secondary | ICD-10-CM | POA: Insufficient documentation

## 2021-12-04 DIAGNOSIS — F17210 Nicotine dependence, cigarettes, uncomplicated: Secondary | ICD-10-CM | POA: Insufficient documentation

## 2021-12-06 ENCOUNTER — Other Ambulatory Visit: Payer: Self-pay

## 2021-12-06 DIAGNOSIS — Z87891 Personal history of nicotine dependence: Secondary | ICD-10-CM

## 2021-12-10 NOTE — Progress Notes (Signed)
?  ?Cardiology Office Note ? ? ?Date:  12/13/2021  ? ?ID:  James Huynh, DOB 07-31-1961, MRN [id number] ? ?PCP:  Donetta Potts, MD  ?Cardiologist:   Rollene Rotunda, MD ? ? ?Chief Complaint  ?Patient presents with  ? Shortness of Breath  ? ? ?  ?History of Present Illness: ?GURTAJ RUZ is a 61 y.o. male who presents for follow up of CAD.  Cardiac cath 2016 :  20% proximal RCA, 80% proximal to mid circumflex with DES, 80% mid LAD with DES.  At the recent appt he had some chest pain and was managed with Imdur.  He said he was having significant shortness of breath.  However, this is thought to be related to COPD.  He does do better on the current medical regimen with a change to [employer].  He is not getting any new shortness of breath but is limited by his dyspnea.  He is not having any chest pressure, neck or arm discomfort.  He had no weight gain though he has some mild lower extremity edema.  He did have an echocardiogram in March of last year and his ejection fraction was 55%.  There were no significant valvular abnormalities.  So a recent CT of his chest and there was mention of emphysema.  This is done for screening given his smoking history.  He does have ongoing smoking.  He is very limited in his activities because of his dyspnea. ? ? ?Past Medical History:  ?Diagnosis Date  ? Asthma   ? CAD (coronary artery disease)   ? cath 09/09/2015 20% prox RCA, 80% prox to mid LCx treated with DES (3.5?18 mm Xience Alpine DES), 80% mid LAD lesion treated with 3.5?18 mm Xience Alpine DES stent postdilated to 4.2.  ? Chest pain   ? a. negative stress echo August 2011  ? GERD (gastroesophageal reflux disease)   ? High cholesterol   ? History of pneumonia   ? a. 2014.  ? Hypothyroidism   ? Morbid obesity (HCC)   ? Tobacco abuse   ? ? ?Past Surgical History:  ?Procedure Laterality Date  ? CARDIAC CATHETERIZATION N/A 09/09/2015  ? Procedure: Left Heart Cath and Coronary Angiography;  Surgeon: Lennette Bihari, MD;   Location: [facility];  Service: Cardiovascular;  Laterality: N/A;  ? CARDIAC CATHETERIZATION  09/09/2015  ? Procedure: Coronary Stent Intervention;  Surgeon: Lennette Bihari, MD;  Location: [facility]VAS[facility] CV [facility];  Service: Cardiovascular;;  ? lymph gland removal    ? MANDIBLE SURGERY    ? broken jaw  ? ? ? ?Current Outpatient Medications  ?Medication Sig Dispense Refill  ? albuterol (PROVENTIL) (2.5 MG/3ML) 0.083% nebulizer solution Take 2.5 mg by nebulization every 6 (six) hours as needed for wheezing or shortness of breath.    ? albuterol (VENTOLIN HFA) 108 (90 Base) MCG/ACT inhaler Inhale 2 puffs into the lungs every 6 (six) hours as needed for wheezing or shortness of breath.    ? aspirin 81 MG tablet Take 81 mg by mouth every evening.    ? Budeson-Glycopyrrol-Formoterol (BREZTRI AEROSPHERE) 160-9-4.8 MCG/ACT AERO Inhale 2 puffs into the lungs 2 (two) times daily. 10.7 g 11  ? Cholecalciferol (VITAMIN D3 PO) Take 1 tablet by mouth daily.    ? ezetimibe (ZETIA) 10 MG tablet Take 1 tablet by mouth once daily 30 tablet 6  ? furosemide (LASIX) 20 MG tablet Take 1 tablet by mouth once daily 90 tablet 1  ? levothyroxine (  SYNTHROID) 137 MCG tablet Take 137 mcg by mouth daily before breakfast.    ? metoprolol tartrate (LOPRESSOR) 25 MG tablet TAKE 1/2 (ONE-HALF) TABLET BY MOUTH TWICE DAILY WITH FOOD (Patient taking differently: 25 mg at bedtime.) 90 tablet 1  ? nitroGLYCERIN (NITROSTAT) 0.4 MG SL tablet PLACE 1 TABLET UNDER THE TONGUE EVERY 5 MIN UP TO 3 TIMES AS NEEDED FOR CHEST PAIN 25 tablet 3  ? omeprazole (PRILOSEC) 40 MG capsule Take 1 capsule (40 mg total) by mouth daily. 30 capsule 11  ? rosuvastatin (CRESTOR) 40 MG tablet Take 1 tablet (40 mg total) by mouth daily. 90 tablet 3  ? isosorbide mononitrate (IMDUR) 30 MG 24 hr tablet Take 0.5 tablets (15 mg total) by mouth daily. (Patient not taking: Reported on 12/13/2021) 45 tablet 3  ? ?No current facility-administered medications for this visit.   ? ? ?Allergies:   Penicillins  ? ? ?ROS:  Please see the history of present illness.   Otherwise, review of systems are positive for questionable recent urinary tract infection symptoms, umbilical hernia, sciatica, pleurisy.   All other systems are reviewed and negative.  ? ? ?PHYSICAL EXAM: ?VS:  BP 116/80   Pulse 68   Ht 5\' 10"  (1.778 m)   Wt 242 lb (109.8 kg)   BMI 34.72 kg/m?  , BMI Body mass index is 34.72 kg/m?. ?GENERAL:  Well appearing ?NECK:  No jugular venous distention, waveform within normal limits, carotid upstroke brisk and symmetric, no bruits, no thyromegaly ?LUNGS:  Clear to auscultation bilaterally ?CHEST: Decreased breath sounds without wheezing or crackles ?HEART:  PMI not displaced or sustained,S1 and S2 within normal limits, no S3, no S4, no clicks, no rubs, no murmurs ?ABD:  Flat, positive bowel sounds normal in frequency in pitch, no bruits, no rebound, no guarding, no midline pulsatile mass, no hepatomegaly, no splenomegaly ?EXT:  2 plus pulses throughout, no edema, no cyanosis no clubbing ? ? ?EKG:  EKG is not ordered today. ?The ekg ordered 07/10/2021 demonstrates sinus rhythm, rate 64, axis within normal limits, intervals no acute ST-T wave changes ? ? ?Recent Labs: ?08/14/2021: Hemoglobin 14.5; Platelets 244  ? ? ? ?Wt Readings from Last 3 Encounters:  ?12/13/21 242 lb (109.8 kg)  ?08/14/21 230 lb 1 oz (104.4 kg)  ?07/10/21 229 lb (103.9 kg)  ?  ?CATH: ? ?Diagnostic ?Dominance: Right ?  ?  ?Intervention ?  ?  ?  ? ?Other studies Reviewed: ?Additional studies/ records that were reviewed today include: Labs. ?Review of the above records demonstrates:  Please see elsewhere in the note.   ? ? ?ASSESSMENT AND PLAN: ? ?CAD in native artery :  The patient has no new sypmtoms.  No further cardiovascular testing is indicated.  We will continue with aggressive risk reduction and meds as listed.  No change in therapy and he will continue with aggressive risk reduction. ?  ?Hyperlipidemia LDL:  LDL is 81.  I would like to switch him to Crestor 40 mg daily and check a lipid profile in about 10 weeks.  ?  ?Chronic obstructive pulmonary disease, unspecified COPD type (HCC): He has been educated about the need to stop smoking.  Otherwise he is feeling better with therapies as listed above and will continue to follow with Dr. [name].  ? ?Morbid obesity (HCC): We did talk about diet. ? ? ?Current medicines are reviewed at length with the patient today.  The patient does not have concerns regarding medicines. ? ?The following changes have  been made: As above ? ?Labs/ tests ordered today include:  ? ?Orders Placed This Encounter  ?Procedures  ? Lipid panel  ? ? ? ?Disposition:   FU with me in 6 months yearly thereafter ? ? ?Signed, ?Rollene Rotunda, MD  ?12/13/2021 2:02 PM    ?[facility] ? ? ? ?

## 2021-12-13 ENCOUNTER — Other Ambulatory Visit: Payer: Self-pay

## 2021-12-13 ENCOUNTER — Ambulatory Visit (INDEPENDENT_AMBULATORY_CARE_PROVIDER_SITE_OTHER): Payer: Medicaid Other | Admitting: Cardiology

## 2021-12-13 ENCOUNTER — Other Ambulatory Visit: Payer: Self-pay | Admitting: *Deleted

## 2021-12-13 ENCOUNTER — Encounter: Payer: Self-pay | Admitting: Cardiology

## 2021-12-13 VITALS — BP 116/80 | HR 68 | Ht 70.0 in | Wt 242.0 lb

## 2021-12-13 DIAGNOSIS — I2510 Atherosclerotic heart disease of native coronary artery without angina pectoris: Secondary | ICD-10-CM | POA: Diagnosis not present

## 2021-12-13 DIAGNOSIS — Z79899 Other long term (current) drug therapy: Secondary | ICD-10-CM

## 2021-12-13 DIAGNOSIS — E785 Hyperlipidemia, unspecified: Secondary | ICD-10-CM

## 2021-12-13 MED ORDER — ROSUVASTATIN CALCIUM 40 MG PO TABS: 40.0000 mg | ORAL_TABLET | Freq: Every day | ORAL | 3 refills | Status: DC

## 2021-12-13 NOTE — Patient Instructions (Signed)
Medication Instructions:  ?Please discontinue your Atorvastatin and start Crestor 40 mg a day. ?Continue all other medications as listed. ? ?*If you need a refill on your cardiac medications before your next appointment, please call your pharmacy* ? ?Lab Work: ?Please have blood work at any [facility] in 10 weeks (5?8/23) ?If you have labs (blood work) drawn today and your tests are completely normal, you will receive your results only by: ?MyChart Message (if you have MyChart) OR ?A paper copy in the mail ?If you have any lab test that is abnormal or we need to change your treatment, we will call you to review the results. ? ?Follow-Up: ?At [facility], you and your health needs are our priority.  As part of our continuing mission to provide you with exceptional heart care, we have created designated Provider Care Teams.  These Care Teams include your primary Cardiologist (physician) and Advanced Practice Providers (APPs -  Physician Assistants and Nurse Practitioners) who all work together to provide you with the care you need, when you need it. ? ?We recommend signing up for the patient portal called "MyChart".  Sign up information is provided on this After Visit Summary.  MyChart is used to connect with patients for Virtual Visits (Telemedicine).  Patients are able to view lab/test results, encounter notes, upcoming appointments, etc.  Non-urgent messages can be sent to your provider as well.   ?To learn more about what you can do with MyChart, go to [email or web address].   ? ?Your next appointment:   ?1 year(s) ? ?The format for your next appointment:   ?In Person ? ?Provider:   ?Minus Breeding, MD  ? ? ?Thank you for choosing [facility]!! ? ? ? ?

## 2021-12-15 ENCOUNTER — Ambulatory Visit: Payer: Medicaid Other | Admitting: Family Medicine

## 2022-01-03 ENCOUNTER — Other Ambulatory Visit: Payer: Self-pay

## 2022-01-03 MED ORDER — FUROSEMIDE 20 MG PO TABS: 20.0000 mg | ORAL_TABLET | Freq: Every day | ORAL | 1 refills | Status: DC

## 2022-01-13 DIAGNOSIS — I219 Acute myocardial infarction, unspecified: Secondary | ICD-10-CM

## 2022-01-13 HISTORY — DX: Acute myocardial infarction, unspecified: I21.9

## 2022-01-15 ENCOUNTER — Other Ambulatory Visit: Payer: Self-pay

## 2022-01-15 ENCOUNTER — Telehealth: Payer: Self-pay | Admitting: Cardiology

## 2022-01-15 ENCOUNTER — Inpatient Hospital Stay (HOSPITAL_COMMUNITY)
Admission: EM | Admit: 2022-01-15 | Discharge: 2022-01-18 | DRG: 246 | Disposition: A | Discharge status: Home / Self Care | Payer: Medicaid Other | Attending: Cardiology | Admitting: Cardiology

## 2022-01-15 ENCOUNTER — Encounter (HOSPITAL_COMMUNITY): Payer: Self-pay | Admitting: Emergency Medicine

## 2022-01-15 ENCOUNTER — Emergency Department (HOSPITAL_COMMUNITY): Payer: Medicaid Other

## 2022-01-15 DIAGNOSIS — Z20822 Contact with and (suspected) exposure to covid-19: Secondary | ICD-10-CM | POA: Diagnosis present

## 2022-01-15 DIAGNOSIS — Z79899 Other long term (current) drug therapy: Secondary | ICD-10-CM

## 2022-01-15 DIAGNOSIS — Z801 Family history of malignant neoplasm of trachea, bronchus and lung: Secondary | ICD-10-CM

## 2022-01-15 DIAGNOSIS — Z720 Tobacco use: Secondary | ICD-10-CM | POA: Diagnosis present

## 2022-01-15 DIAGNOSIS — Z87891 Personal history of nicotine dependence: Secondary | ICD-10-CM

## 2022-01-15 DIAGNOSIS — Y712 Prosthetic and other implants, materials and accessory cardiovascular devices associated with adverse incidents: Secondary | ICD-10-CM | POA: Diagnosis present

## 2022-01-15 DIAGNOSIS — J449 Chronic obstructive pulmonary disease, unspecified: Secondary | ICD-10-CM | POA: Diagnosis present

## 2022-01-15 DIAGNOSIS — Z79890 Hormone replacement therapy (postmenopausal): Secondary | ICD-10-CM

## 2022-01-15 DIAGNOSIS — I21A9 Other myocardial infarction type: Secondary | ICD-10-CM | POA: Diagnosis present

## 2022-01-15 DIAGNOSIS — Z880 Allergy status to penicillin: Secondary | ICD-10-CM

## 2022-01-15 DIAGNOSIS — Z955 Presence of coronary angioplasty implant and graft: Secondary | ICD-10-CM

## 2022-01-15 DIAGNOSIS — I214 Non-ST elevation (NSTEMI) myocardial infarction: Secondary | ICD-10-CM

## 2022-01-15 DIAGNOSIS — T82855A Stenosis of coronary artery stent, initial encounter: Principal | ICD-10-CM | POA: Diagnosis present

## 2022-01-15 DIAGNOSIS — E039 Hypothyroidism, unspecified: Secondary | ICD-10-CM | POA: Diagnosis present

## 2022-01-15 DIAGNOSIS — Z7982 Long term (current) use of aspirin: Secondary | ICD-10-CM

## 2022-01-15 DIAGNOSIS — Z6834 Body mass index (BMI) 34.0-34.9, adult: Secondary | ICD-10-CM

## 2022-01-15 DIAGNOSIS — Z8249 Family history of ischemic heart disease and other diseases of the circulatory system: Secondary | ICD-10-CM

## 2022-01-15 DIAGNOSIS — I2510 Atherosclerotic heart disease of native coronary artery without angina pectoris: Secondary | ICD-10-CM | POA: Diagnosis present

## 2022-01-15 DIAGNOSIS — E669 Obesity, unspecified: Secondary | ICD-10-CM | POA: Diagnosis present

## 2022-01-15 DIAGNOSIS — K219 Gastro-esophageal reflux disease without esophagitis: Secondary | ICD-10-CM | POA: Diagnosis present

## 2022-01-15 DIAGNOSIS — E7800 Pure hypercholesterolemia, unspecified: Secondary | ICD-10-CM | POA: Diagnosis present

## 2022-01-15 DIAGNOSIS — E785 Hyperlipidemia, unspecified: Secondary | ICD-10-CM | POA: Diagnosis present

## 2022-01-15 LAB — CBC
HCT: 43.4 % (ref 39.0–52.0)
Hemoglobin: 14.6 g/dL (ref 13.0–17.0)
MCH: 31.3 pg (ref 26.0–34.0)
MCHC: 33.6 g/dL (ref 30.0–36.0)
MCV: 92.9 fL (ref 80.0–100.0)
Platelets: 216 10*3/uL (ref 150–400)
RBC: 4.67 MIL/uL (ref 4.22–5.81)
RDW: 13.7 % (ref 11.5–15.5)
WBC: 11.9 10*3/uL — ABNORMAL HIGH (ref 4.0–10.5)
nRBC: 0 % (ref 0.0–0.2)

## 2022-01-15 LAB — TROPONIN I (HIGH SENSITIVITY)
Troponin I (High Sensitivity): 279 ng/L (ref <18)
Troponin I (High Sensitivity): 282 ng/L (ref <18)

## 2022-01-15 LAB — RESP PANEL BY RT-PCR (FLU A&B, COVID) ARPGX2
Influenza A by PCR: NEGATIVE
Influenza B by PCR: NEGATIVE
SARS Coronavirus 2 by RT PCR: NEGATIVE

## 2022-01-15 LAB — BASIC METABOLIC PANEL
Anion gap: 7 (ref 5–15)
BUN: 10 mg/dL (ref 6–20)
CO2: 27 mmol/L (ref 22–32)
Calcium: 8.7 mg/dL — ABNORMAL LOW (ref 8.9–10.3)
Chloride: 104 mmol/L (ref 98–111)
Creatinine, Ser: 1.14 mg/dL (ref 0.61–1.24)
GFR, Estimated: >60 mL/min (ref >60)
Glucose, Bld: 82 mg/dL (ref 70–99)
Potassium: 3.6 mmol/L (ref 3.5–5.1)
Sodium: 138 mmol/L (ref 135–145)

## 2022-01-15 LAB — HEPARIN LEVEL (UNFRACTIONATED): Heparin Unfractionated: 0.39 IU/mL (ref 0.30–0.70)

## 2022-01-15 MED ORDER — ASPIRIN 300 MG RE SUPP: 300.0000 mg | RECTAL | Status: DC

## 2022-01-15 MED ORDER — ASPIRIN 81 MG PO CHEW
81.0000 mg | CHEWABLE_TABLET | ORAL | Status: AC
  Administered 2022-01-16: 81 mg via ORAL
  Filled 2022-01-15: qty 1

## 2022-01-15 MED ORDER — ATORVASTATIN CALCIUM 40 MG PO TABS: 80.0000 mg | ORAL_TABLET | Freq: Every day | ORAL | Status: DC

## 2022-01-15 MED ORDER — ONDANSETRON HCL 4 MG/2ML IJ SOLN
4.0000 mg | Freq: Four times a day (QID) | INTRAMUSCULAR | Status: DC | PRN
Start: 2022-01-15 — End: 2022-01-18
  Administered 2022-01-17: 4 mg via INTRAVENOUS
  Filled 2022-01-15: qty 2

## 2022-01-15 MED ORDER — HEPARIN BOLUS VIA INFUSION
4000.0000 [IU] | Freq: Once | INTRAVENOUS | Status: AC
  Administered 2022-01-15: 4000 [IU] via INTRAVENOUS

## 2022-01-15 MED ORDER — SODIUM CHLORIDE 0.9 % IV SOLN: 250.0000 mL | INTRAVENOUS | Status: DC | PRN

## 2022-01-15 MED ORDER — SODIUM CHLORIDE 0.9 % WEIGHT BASED INFUSION: 1.0000 mL/kg/h | INTRAVENOUS | Status: DC

## 2022-01-15 MED ORDER — SODIUM CHLORIDE 0.9 % IV BOLUS
250.0000 mL | Freq: Once | INTRAVENOUS | Status: AC
  Administered 2022-01-15: 250 mL via INTRAVENOUS

## 2022-01-15 MED ORDER — LEVOTHYROXINE SODIUM 75 MCG PO TABS
150.0000 ug | ORAL_TABLET | Freq: Every day | ORAL | Status: DC
  Administered 2022-01-16 – 2022-01-18 (×3): 150 ug via ORAL
  Filled 2022-01-15 (×3): qty 2

## 2022-01-15 MED ORDER — ROSUVASTATIN CALCIUM 20 MG PO TABS
40.0000 mg | ORAL_TABLET | Freq: Every day | ORAL | Status: DC
  Administered 2022-01-15 – 2022-01-17 (×3): 40 mg via ORAL
  Filled 2022-01-15 (×4): qty 2

## 2022-01-15 MED ORDER — ASPIRIN EC 81 MG PO TBEC: 81.0000 mg | DELAYED_RELEASE_TABLET | Freq: Every day | ORAL | Status: DC

## 2022-01-15 MED ORDER — SODIUM CHLORIDE 0.9% FLUSH
3.0000 mL | Freq: Two times a day (BID) | INTRAVENOUS | Status: DC
  Administered 2022-01-15: 3 mL via INTRAVENOUS

## 2022-01-15 MED ORDER — NITROGLYCERIN 0.4 MG SL SUBL: 0.4000 mg | SUBLINGUAL_TABLET | SUBLINGUAL | Status: DC | PRN

## 2022-01-15 MED ORDER — ACETAMINOPHEN 325 MG PO TABS: 650.0000 mg | ORAL_TABLET | ORAL | Status: DC | PRN

## 2022-01-15 MED ORDER — SODIUM CHLORIDE 0.9 % WEIGHT BASED INFUSION
3.0000 mL/kg/h | INTRAVENOUS | Status: DC
Start: 2022-01-16 — End: 2022-01-16

## 2022-01-15 MED ORDER — ASPIRIN 81 MG PO CHEW: 324.0000 mg | CHEWABLE_TABLET | ORAL | Status: DC

## 2022-01-15 MED ORDER — PANTOPRAZOLE SODIUM 40 MG PO TBEC
40.0000 mg | DELAYED_RELEASE_TABLET | Freq: Every day | ORAL | Status: DC
  Administered 2022-01-16 – 2022-01-18 (×2): 40 mg via ORAL
  Filled 2022-01-15 (×4): qty 1

## 2022-01-15 MED ORDER — ASPIRIN 325 MG PO TABS
325.0000 mg | ORAL_TABLET | Freq: Every day | ORAL | Status: DC
  Administered 2022-01-15: 325 mg via ORAL
  Filled 2022-01-15: qty 1

## 2022-01-15 MED ORDER — EZETIMIBE 10 MG PO TABS
10.0000 mg | ORAL_TABLET | Freq: Every day | ORAL | Status: DC
  Administered 2022-01-15 – 2022-01-17 (×3): 10 mg via ORAL
  Filled 2022-01-15 (×4): qty 1

## 2022-01-15 MED ORDER — SODIUM CHLORIDE 0.9% FLUSH: 3.0000 mL | INTRAVENOUS | Status: DC | PRN

## 2022-01-15 MED ORDER — HEPARIN (PORCINE) 25000 UT/250ML-% IV SOLN
1200.0000 [IU]/h | INTRAVENOUS | Status: DC
  Administered 2022-01-15 – 2022-01-17 (×3): 1200 [IU]/h via INTRAVENOUS
  Filled 2022-01-15 (×3): qty 250

## 2022-01-15 NOTE — H&P (Signed)
?Cardiology Admission History and Physical:  ? ?Patient ID: James Huynh ?MRN: [id number]; DOB: 09/09/1961  ? ?Admission date: 01/15/2022 ? ?PCP:  Donetta Potts, MD ?  ?[facility] Providers ?Cardiologist:  Rollene Rotunda, MD   { ? ? ? ?Chief Complaint:  Chest pain ? ?Patient Profile:  ? ?James Huynh is a 61 y.o. male with history of CAD with DES to both LCX and LAD in 2016 who is being seen 01/15/2022 for the evaluation of chest pain. ? ?History of Present Illness:  ? ?James Huynh 61 yo male history of CAD with DES to LCX and DES to LAD in 2016, COPD, HL presents with chest pain ? ?Reports chest pain started yesterday 9pm at rest. Primarily a warmth in upper chest extending into throat with squeezing feeling in throat. Some associated SOB. Lasted about 30-40 minutes. Was able to sleep that night without issues. Recurrent episode earlier today while walking at the store, same warthm and throat tightness feeling. After this episode presented to ER.  ? ?K 3.6 BUN 10 Cr 1.14 WBC 11.9 Hgb 14.6 Plt 216  ?Trop 279--> ?EKG SR, anterior TWIs(chronic) ?CXR no acute process ? ?03/2021 echo: LVEF 55%, indet diastolic,  ?2016 cath: mid LAD 80% received DES, prox to mid LCX 80% received DES, RCA 20% ? ? ? ? ?Past Medical History:  ?Diagnosis Date  ? Asthma   ? CAD (coronary artery disease)   ? cath 09/09/2015 20% prox RCA, 80% prox to mid LCx treated with DES (3.5?18 mm Xience Alpine DES), 80% mid LAD lesion treated with 3.5?18 mm Xience Alpine DES stent postdilated to 4.2.  ? Chest pain   ? a. negative stress echo August 2011  ? GERD (gastroesophageal reflux disease)   ? High cholesterol   ? History of pneumonia   ? a. 2014.  ? Hypothyroidism   ? Morbid obesity (HCC)   ? Tobacco abuse   ? ? ?Past Surgical History:  ?Procedure Laterality Date  ? CARDIAC CATHETERIZATION N/A 09/09/2015  ? Procedure: Left Heart Cath and Coronary Angiography;  Surgeon: Lennette Bihari, MD;  Location: [facility];  Service:  Cardiovascular;  Laterality: N/A;  ? CARDIAC CATHETERIZATION  09/09/2015  ? Procedure: Coronary Stent Intervention;  Surgeon: Lennette Bihari, MD;  Location: [facility];  Service: Cardiovascular;;  ? lymph gland removal    ? MANDIBLE SURGERY    ? broken jaw  ?  ? ?Medications Prior to Admission: ?Prior to Admission medications   ?Medication Sig Start Date End Date Taking? Authorizing Provider  ?albuterol (PROVENTIL) (2.5 MG/3ML) 0.083% nebulizer solution Take 2.5 mg by nebulization every 6 (six) hours as needed for wheezing or shortness of breath.   Yes [provider]  ?albuterol (VENTOLIN HFA) 108 (90 Base) MCG/ACT inhaler Inhale 2 puffs into the lungs every 6 (six) hours as needed for wheezing or shortness of breath.   Yes [provider]  ?aspirin 81 MG tablet Take 81 mg by mouth every evening.   Yes [provider]  ?aspirin-acetaminophen-caffeine (EXCEDRIN MIGRAINE) [phone] MG tablet Take 2 tablets by mouth every 6 (six) hours as needed for headache.   Yes [provider]  ?Budeson-Glycopyrrol-Formoterol (BREZTRI AEROSPHERE) 160-9-4.8 MCG/ACT AERO Inhale 2 puffs into the lungs 2 (two) times daily. 03/17/21  Yes Nyoka Cowden, MD  ?Cholecalciferol (VITAMIN D3 PO) Take 1 tablet by mouth daily.   Yes [provider]  ?ezetimibe (ZETIA) 10 MG tablet Take 1 tablet  by mouth once daily 09/14/21  Yes Hochrein, Fayrene FearingJames, MD  ?furosemide (LASIX) 20 MG tablet Take 1 tablet (20 mg total) by mouth daily. 01/03/22  Yes Rollene RotundaHochrein, James, MD  ?levothyroxine (SYNTHROID) 150 MCG tablet Take 150 mcg by mouth daily. 01/02/22  Yes [provider]  ?metoprolol tartrate (LOPRESSOR) 25 MG tablet TAKE 1/2 (ONE-HALF) TABLET BY MOUTH TWICE DAILY WITH FOOD ?Patient taking differently: 25 mg at bedtime. 07/24/21  Yes Netta NeatQuinn, Andrew L Jr., NP  ?nitroGLYCERIN (NITROSTAT) 0.4 MG SL tablet PLACE 1 TABLET UNDER THE TONGUE EVERY 5 MIN UP TO 3 TIMES AS NEEDED FOR CHEST PAIN 11/04/20  Yes  Netta NeatQuinn, Andrew L Jr., NP  ?omeprazole (PRILOSEC) 40 MG capsule Take 1 capsule (40 mg total) by mouth daily. 02/13/21  Yes Nyoka CowdenWert, Michael B, MD  ?rosuvastatin (CRESTOR) 40 MG tablet Take 1 tablet (40 mg total) by mouth daily. 12/13/21  Yes Rollene RotundaHochrein, James, MD  ?isosorbide mononitrate (IMDUR) 30 MG 24 hr tablet Take 0.5 tablets (15 mg total) by mouth daily. ?Patient not taking: Reported on 12/13/2021 07/10/21 10/08/21  Netta NeatQuinn, Andrew L Jr., NP  ?  ? ?Allergies:    ?Allergies  ?Allergen Reactions  ? Penicillins Anaphylaxis  ? ? ?Social History:   ?Social History  ? ?Socioeconomic History  ? Marital status: Divorced  ?  Spouse name: Not on file  ? Number of children: Not on file  ? Years of education: Not on file  ? Highest education level: Not on file  ?Occupational History  ? Not on file  ?Tobacco Use  ? Smoking status: Former  ?  Packs/day: 1.00  ?  Years: 32.00  ?  Pack years: 32.00  ?  Types: Cigarettes  ?  Start date: 08/06/1975  ?  Quit date: 01/30/2021  ?  Years since quitting: 0.9  ?  Passive exposure: Current  ? Smokeless tobacco: Never  ?Vaping Use  ? Vaping Use: Never used  ?Substance and Sexual Activity  ? Alcohol use: No  ?  Alcohol/week: 0.0 standard drinks  ? Drug use: Yes  ?  Frequency: 1.0 times per week  ?  Comment: occasionally smokes marijuana.-Denies 01/15/22  ? Sexual activity: Not on file  ?Other Topics Concern  ? Not on file  ?Social History Narrative  ? [place] in [place] with his girlfriend and 61 year old father.  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  ? ?Social Determinants of Health  ? ?Financial Resource Strain: Not on file  ?Food Insecurity: Not on file  ?Transportation Needs: Not on file  ?Physical Activity: Not on file  ?Stress: Not on file  ?Social Connections: Not on file  ?Intimate Partner Violence: Not on file  ?  ?Family History:   ?The patient's family history includes Heart attack in his father; Lung cancer in his mother.   ? ?ROS:  ?Please see the history  of present illness.  ?All other ROS reviewed and negative.    ? ?Physical Exam/Data:  ? ?Vitals:  ? 01/15/22 1430 01/15/22 1500 01/15/22 1530 01/15/22 1600  ?BP: 105/71 103/64 94/66 101/65  ?Pulse: 73 88 71 78  ?Resp: 20 (!) 21 16 17   ?Temp:      ?TempSrc:      ?SpO2: 97% 94% 98% 96%  ?Weight:      ?Height:      ? ?No intake or output data in the 24 hours ending 01/15/22 1605 ? ?  01/15/2022  ?  1:57 PM 12/13/2021  ?  1:17 PM 08/14/2021  ?  8:54 AM  ?Last 3 Weights  ?Weight (lbs) 239 lb 242 lb 230 lb 1 oz  ?Weight (kg) 108.41 kg 109.77 kg 104.355 kg  ?   ?Body mass index is 34.29 kg/m?.  ?General:  Well nourished, well developed, in no acute distress ?HEENT: normal ?Neck: no JVD ?Vascular: No carotid bruits; Distal pulses 2+ bilaterally   ?Cardiac:  normal S1, S2; RRR; no murmur  ?Lungs:  clear to auscultation bilaterally, no wheezing, rhonchi or rales  ?Abd: soft, nontender, no hepatomegaly  ?Ext: no edema ?Musculoskeletal:  No deformities, BUE and BLE strength normal and equal ?Skin: warm and dry  ?Neuro:  CNs 2-12 intact, no focal abnormalities noted ?Psych:  Normal affect  ? ? ? ? ?Laboratory Data: ? ?High Sensitivity Troponin:   ?Recent Labs  ?Lab 01/15/22 ?1359  ?TROPONINIHS 279*  ?    ?Chemistry ?Recent Labs  ?Lab 01/15/22 ?1359  ?NA 138  ?K 3.6  ?CL 104  ?CO2 27  ?GLUCOSE 82  ?BUN 10  ?CREATININE 1.14  ?CALCIUM 8.7*  ?GFRNONAA >60  ?ANIONGAP 7  ?  ?No results for input(s): PROT, ALBUMIN, AST, ALT, ALKPHOS, BILITOT in the last 168 hours. ?Lipids No results for input(s): CHOL, TRIG, HDL, LABVLDL, LDLCALC, CHOLHDL in the last 168 hours. ?Hematology ?Recent Labs  ?Lab 01/15/22 ?1359  ?WBC 11.9*  ?RBC 4.67  ?HGB 14.6  ?HCT 43.4  ?MCV 92.9  ?MCH 31.3  ?MCHC 33.6  ?RDW 13.7  ?PLT 216  ? ?Thyroid No results for input(s): TSH, FREET4 in the last 168 hours. ?BNPNo results for input(s): BNP, PROBNP in the last 168 hours.  ?DDimer No results for input(s): DDIMER in the last 168 hours. ? ? ?Radiology/Studies:  ?DG Chest 2  View ? ?Result Date: 01/15/2022 ?CLINICAL DATA:  A 61 year old male. EXAM: CHEST - 2 VIEW COMPARISON:  Comparison made with December 20, 2020. FINDINGS: Trachea midline. Cardiomediastinal contours and hilar structures

## 2022-01-15 NOTE — Telephone Encounter (Signed)
Pt c/o of Chest Pain: STAT if CP now or developed within 24 hours ? ?1. Are you having CP right now? No  ? ?2. Are you experiencing any other symptoms (ex. SOB, nausea, vomiting, sweating)? SOB, neck started to hurt a little bit, warm sensation in his chest.  ? ?3. How long have you been experiencing CP? Last night about 9pm and today around 10:15am.  He is fine now.  ? ?4. Is your CP continuous or coming and going? Comes and goes.  ? ?5. Have you taken Nitroglycerin? No ? ?He took baby aspirin.  ??  ?

## 2022-01-15 NOTE — ED Notes (Signed)
Patient states that he had 81mg  x 3.5 tabs PTA. PA made aware and states to go ahead and administer full dose ordered here.  ?

## 2022-01-15 NOTE — Progress Notes (Signed)
ANTICOAGULATION CONSULT NOTE - Initial Consult ? ?Pharmacy Consult for Heparin ?Indication: chest pain/ACS ? ?Allergies  ?Allergen Reactions  ? Penicillins Anaphylaxis  ? ? ?Patient Measurements: ?Height: 5\' 10"  (177.8 cm) ?Weight: 108.4 kg (239 lb) ?IBW/kg (Calculated) : 73 ?HEPARIN DW (KG): 96.4  ? ?Vital Signs: ?Temp: 97.1 ?F (36.2 ?C) (04/03 1357) ?Temp Source: Oral (04/03 1357) ?BP: 103/64 (04/03 1500) ?Pulse Rate: 88 (04/03 1500) ? ?Labs: ?Recent Labs  ?  01/15/22 ?1359  ?HGB 14.6  ?HCT 43.4  ?PLT 216  ?CREATININE 1.14  ?TROPONINIHS 279*  ? ? ?Estimated Creatinine Clearance: 85 mL/min (by C-G formula based on SCr of 1.14 mg/dL). ? ? ?Medical History: ?Past Medical History:  ?Diagnosis Date  ? Asthma   ? CAD (coronary artery disease)   ? cath 09/09/2015 20% prox RCA, 80% prox to mid LCx treated with DES (3.5?18 mm Xience Alpine DES), 80% mid LAD lesion treated with 3.5?18 mm Xience Alpine DES stent postdilated to 4.2.  ? Chest pain   ? a. negative stress echo August 2011  ? GERD (gastroesophageal reflux disease)   ? High cholesterol   ? History of pneumonia   ? a. 2014.  ? Hypothyroidism   ? Morbid obesity (HCC)   ? Tobacco abuse   ? ? ?Medications:  ?See med rec ? ?Assessment: ?61 year old male with history of coronary artery disease, hyperlipidemia, NSTEMI with stent placement in 2016, tobacco use disorder current daily smoker, COPD, morbid obesity presenting today with chest pain.  Started last night, states its in the middle of his chest and feels "hot".  Its not a burning, it moves up to his neck and his neck feels stiff.  He states this feels identical to when he had heart attack in 2016.  Last night he took 4 aspirin which helped alleviate the pain, it started again this morning at 10 AM.  Is been constant, and he does have some associated shortness of breath but no nausea or vomiting.. Not on oral anticoagulants. Pharmacy asked to start heparin ? ?Goal of Therapy:  ?Heparin level 0.3-0.7  units/ml ?Monitor platelets by anticoagulation protocol: Yes ?  ?Plan:  ?Give 4000 units bolus x 1 ?Start heparin infusion at 1200 units/hr ?Check anti-Xa level in ~6 hours and daily while on heparin ?Continue to monitor H&H and platelets ? ?2017, BS Pharm D, BCPS ?Clinical Pharmacist ?01/15/2022,3:30 PM ? ? ?

## 2022-01-15 NOTE — Telephone Encounter (Signed)
Patient in to triage reporting an episode of a warm feeling in chest and neck last night that went away with (4) 81 mg ASA. He had another episode this am with sob and took (3) 81 mg ASA. He stated he did not take NTG because he will only take that if he is sweating. I explained the purpose and usage of NTG. I recommended that someone take him to the ED or call 911. He said he would have someone take him. ?

## 2022-01-15 NOTE — ED Provider Notes (Signed)
?[facility] EMERGENCY DEPARTMENT ?Provider Note ? ? ?CSN: [id number] ?Arrival date & time: 01/15/22  1342 ? ?  ? ?History ? ?Chief Complaint  ?Patient presents with  ? Chest Pain  ? ? ?James Huynh is a 61 y.o. male. ? ? ?Chest Pain ? ?Patient is a 61 year old male with history of coronary artery disease, hyperlipidemia, NSTEMI with stent placement in 2016, tobacco use disorder current daily smoker, COPD, morbid obesity presenting today with chest pain.  Started last night, states its in the middle of his chest and feels "hot".  Its not a burning, it moves up to his neck and his neck feels stiff.  He states this feels identical to when he had heart attack in 2016.  Last night he took 4 aspirin which helped alleviate the pain, it started again this morning at 10 AM.  Is been constant, nothing is made it better thus far.  He does have some associated shortness of breath but no nausea or vomiting. ? ?Home Medications ?Prior to Admission medications   ?Medication Sig Start Date End Date Taking? Authorizing Provider  ?albuterol (PROVENTIL) (2.5 MG/3ML) 0.083% nebulizer solution Take 2.5 mg by nebulization every 6 (six) hours as needed for wheezing or shortness of breath.   Yes [provider]  ?albuterol (VENTOLIN HFA) 108 (90 Base) MCG/ACT inhaler Inhale 2 puffs into the lungs every 6 (six) hours as needed for wheezing or shortness of breath.   Yes [provider]  ?aspirin 81 MG tablet Take 81 mg by mouth every evening.   Yes [provider]  ?aspirin-acetaminophen-caffeine (EXCEDRIN MIGRAINE) [phone] MG tablet Take 2 tablets by mouth every 6 (six) hours as needed for headache.   Yes [provider]  ?Budeson-Glycopyrrol-Formoterol (BREZTRI AEROSPHERE) 160-9-4.8 MCG/ACT AERO Inhale 2 puffs into the lungs 2 (two) times daily. 03/17/21  Yes Nyoka Cowden, MD  ?Cholecalciferol (VITAMIN D3 PO) Take 1 tablet by mouth daily.   Yes [provider]  ?ezetimibe (ZETIA) 10 MG  tablet Take 1 tablet by mouth once daily 09/14/21  Yes Hochrein, Fayrene Fearing, MD  ?furosemide (LASIX) 20 MG tablet Take 1 tablet (20 mg total) by mouth daily. 01/03/22  Yes Rollene Rotunda, MD  ?levothyroxine (SYNTHROID) 150 MCG tablet Take 150 mcg by mouth daily. 01/02/22  Yes [provider]  ?metoprolol tartrate (LOPRESSOR) 25 MG tablet TAKE 1/2 (ONE-HALF) TABLET BY MOUTH TWICE DAILY WITH FOOD ?Patient taking differently: 25 mg at bedtime. 07/24/21  Yes Netta Neat., NP  ?nitroGLYCERIN (NITROSTAT) 0.4 MG SL tablet PLACE 1 TABLET UNDER THE TONGUE EVERY 5 MIN UP TO 3 TIMES AS NEEDED FOR CHEST PAIN 11/04/20  Yes Netta Neat., NP  ?omeprazole (PRILOSEC) 40 MG capsule Take 1 capsule (40 mg total) by mouth daily. 02/13/21  Yes Nyoka Cowden, MD  ?rosuvastatin (CRESTOR) 40 MG tablet Take 1 tablet (40 mg total) by mouth daily. 12/13/21  Yes Rollene Rotunda, MD  ?isosorbide mononitrate (IMDUR) 30 MG 24 hr tablet Take 0.5 tablets (15 mg total) by mouth daily. ?Patient not taking: Reported on 12/13/2021 07/10/21 10/08/21  Netta Neat., NP  ?   ? ?Allergies    ?Penicillins   ? ?Review of Systems   ?Review of Systems  ?Cardiovascular:  Positive for chest pain.  ? ?Physical Exam ?Updated Vital Signs ?BP 103/64   Pulse 88   Temp (!) 97.1 ?F (36.2 ?C) (Oral)   Resp (!) 21   Ht 5\' 10"  (1.778 m)  Wt 108.4 kg   SpO2 94%   BMI 34.29 kg/m?  ?Physical Exam ?Vitals and nursing note reviewed. Exam conducted with a chaperone present.  ?Constitutional:   ?   Appearance: Normal appearance. He is obese.  ?HENT:  ?   Head: Normocephalic and atraumatic.  ?Eyes:  ?   General: No scleral icterus.    ?   Right eye: No discharge.     ?   Left eye: No discharge.  ?   Extraocular Movements: Extraocular movements intact.  ?   Pupils: Pupils are equal, round, and reactive to light.  ?Cardiovascular:  ?   Rate and Rhythm: Normal rate and regular rhythm.  ?   Pulses: Normal pulses.  ?   Heart sounds: Normal heart sounds. No  murmur heard. ?  No friction rub. No gallop.  ?Pulmonary:  ?   Effort: Pulmonary effort is normal. No respiratory distress.  ?   Breath sounds: Decreased breath sounds present.  ?Abdominal:  ?   General: Abdomen is flat. Bowel sounds are normal. There is no distension.  ?   Palpations: Abdomen is soft.  ?   Tenderness: There is no abdominal tenderness.  ?Skin: ?   General: Skin is warm and dry.  ?   Coloration: Skin is not jaundiced.  ?Neurological:  ?   Mental Status: He is alert. Mental status is at baseline.  ?   Coordination: Coordination normal.  ? ? ?ED Results / Procedures / Treatments   ?Labs ?(all labs ordered are listed, but only abnormal results are displayed) ?Labs Reviewed  ?BASIC METABOLIC PANEL - Abnormal; Notable for the following components:  ?    Result Value  ? Calcium 8.7 (*)   ? All other components within normal limits  ?CBC - Abnormal; Notable for the following components:  ? WBC 11.9 (*)   ? All other components within normal limits  ?TROPONIN I (HIGH SENSITIVITY) - Abnormal; Notable for the following components:  ? Troponin I (High Sensitivity) 279 (*)   ? All other components within normal limits  ? ? ?EKG ?EKG Interpretation ? ?Date/Time:  Monday January 15 2022 13:56:26 EDT ?Ventricular Rate:  74 ?PR Interval:  135 ?QRS Duration: 115 ?QT Interval:  363 ?QTC Calculation: 403 ?R Axis:   51 ?Text Interpretation: Sinus rhythm Nonspecific intraventricular conduction delay Borderline T abnormalities, anterior leads ST/T changes similar to 2021 Confirmed by Pricilla Loveless [phone]) on 01/15/2022 3:12:10 PM ? ?Radiology ?DG Chest 2 View ? ?Result Date: 01/15/2022 ?CLINICAL DATA:  A 61 year old male. EXAM: CHEST - 2 VIEW COMPARISON:  Comparison made with December 20, 2020. FINDINGS: Trachea midline. Cardiomediastinal contours and hilar structures are normal. No lobar consolidation. No visible pneumothorax. No sign of pleural effusion. Slight added density along the LEFT heart border may been present  previously, suspect this represents prominence of epicardial fat. Mild lingular scarring is also present based on previous CT of the chest. On limited assessment there is no acute skeletal process. EKG leads project over the chest. IMPRESSION: No active cardiopulmonary disease. Electronically Signed   By: Donzetta Kohut M.D.   On: 01/15/2022 14:34   ? ?Procedures ?Marland KitchenCritical Care ?Performed by: Theron Arista, PA-C ?Authorized by: Theron Arista, PA-C  ? ?Critical care provider statement:  ?  Critical care time (minutes):  30 ?  Critical care start time:  01/15/2022 2:57 AM ?  Critical care end time:  01/15/2022 3:27 PM ?  Critical care was necessary to treat or prevent imminent or  life-threatening deterioration of the following conditions:  Cardiac failure ?  Critical care was time spent personally by me on the following activities:  Development of treatment plan with patient or surrogate, discussions with consultants, evaluation of patient's response to treatment, examination of patient, ordering and review of laboratory studies, ordering and review of radiographic studies, ordering and performing treatments and interventions, pulse oximetry, re-evaluation of patient's condition and review of old charts ?  Care discussed with: accepting provider at another facility    ? ? ?Medications Ordered in ED ?Medications  ?aspirin tablet 325 mg (325 mg Oral Given 01/15/22 1524)  ? ? ?ED Course/ Medical Decision Making/ A&P ?  ?                        ?Medical Decision Making ?Amount and/or Complexity of Data Reviewed ?Labs: ordered. ?Radiology: ordered. ? ?Risk ?OTC drugs. ? ? ?This patient presents to the ED for concern of chest pain, this involves an extensive number of treatment options, and is a complaint that carries with it a high risk of complications and morbidity.  The differential diagnosis includes but not limited to: ACS, PE, PNA.  ? ?Patient?s presentation is complicated by their history of prior MI requiring stents,  current tobacco use, hyperlipidemia ? ? ?Additional history obtained:  ? ?Independent historian: Wife ? ?I reviewed record, echo in November last year.  ? ?  ?Lab Tests: ? ?I ordered, viewed, and personally interpreted labs.  TLowella Dandy

## 2022-01-15 NOTE — Telephone Encounter (Signed)
Patient is currently in the ER. 

## 2022-01-15 NOTE — Progress Notes (Signed)
ANTICOAGULATION CONSULT NOTE ? ?Pharmacy Consult for Heparin ?Indication: chest pain/ACS ? ?Allergies  ?Allergen Reactions  ? Penicillins Anaphylaxis  ? ? ?Patient Measurements: ?Height: 5\' 10"  (177.8 cm) ?Weight: 108.4 kg (239 lb) ?IBW/kg (Calculated) : 73 ?HEPARIN DW (KG): 96.4  ? ?Vital Signs: ?Temp: 97.1 ?F (36.2 ?C) (04/03 1357) ?Temp Source: Oral (04/03 1357) ?BP: 101/64 (04/03 2100) ?Pulse Rate: 67 (04/03 2100) ? ?Labs: ?Recent Labs  ?  01/15/22 ?1359 01/15/22 ?1536 01/15/22 ?2223  ?HGB 14.6  --   --   ?HCT 43.4  --   --   ?PLT 216  --   --   ?HEPARINUNFRC  --   --  0.39  ?CREATININE 1.14  --   --   ?TROPONINIHS 279* 282*  --   ? ? ? ?Estimated Creatinine Clearance: 85 mL/min (by C-G formula based on SCr of 1.14 mg/dL). ? ? ?Assessment: ?61 year old male with history of coronary artery disease, hyperlipidemia, NSTEMI with stent placement in 2016, tobacco use disorder current daily smoker, COPD, morbid obesity presenting today with chest pain. Not on oral anticoagulants. Pharmacy asked to start heparin. ? ?Heparin level therapeutic (0.39) on infusion at 1200 units/hr. No bleeding noted. ? ?Goal of Therapy:  ?Heparin level 0.3-0.7 units/ml ?Monitor platelets by anticoagulation protocol: Yes ?  ?Plan:  ?Continue heparin infusion at 1200 units/hr ?F/u daily heparin level and CBC ? ?2017, PharmD, BCPS ?Please see amion for complete clinical pharmacist phone list ?01/15/2022,11:14 PM ? ? ?

## 2022-01-15 NOTE — ED Triage Notes (Signed)
Pt to the ED with complaints of chest pain that initially began last night but subsided with 4 baby aspirin.  ? ?Pt's chest pain returned today at 1000. Pt has a hx of a MI with stent placement in 2016. ? ?

## 2022-01-16 ENCOUNTER — Telehealth: Payer: Self-pay | Admitting: Cardiology

## 2022-01-16 ENCOUNTER — Observation Stay (HOSPITAL_BASED_OUTPATIENT_CLINIC_OR_DEPARTMENT_OTHER): Payer: Medicaid Other

## 2022-01-16 DIAGNOSIS — Z880 Allergy status to penicillin: Secondary | ICD-10-CM | POA: Diagnosis not present

## 2022-01-16 DIAGNOSIS — T82855A Stenosis of coronary artery stent, initial encounter: Secondary | ICD-10-CM | POA: Diagnosis not present

## 2022-01-16 DIAGNOSIS — J449 Chronic obstructive pulmonary disease, unspecified: Secondary | ICD-10-CM | POA: Diagnosis not present

## 2022-01-16 DIAGNOSIS — Z20822 Contact with and (suspected) exposure to covid-19: Secondary | ICD-10-CM | POA: Diagnosis not present

## 2022-01-16 DIAGNOSIS — I2510 Atherosclerotic heart disease of native coronary artery without angina pectoris: Secondary | ICD-10-CM | POA: Diagnosis not present

## 2022-01-16 DIAGNOSIS — Z79890 Hormone replacement therapy (postmenopausal): Secondary | ICD-10-CM | POA: Diagnosis not present

## 2022-01-16 DIAGNOSIS — R079 Chest pain, unspecified: Secondary | ICD-10-CM

## 2022-01-16 DIAGNOSIS — E7800 Pure hypercholesterolemia, unspecified: Secondary | ICD-10-CM | POA: Diagnosis not present

## 2022-01-16 DIAGNOSIS — I21A9 Other myocardial infarction type: Secondary | ICD-10-CM | POA: Diagnosis not present

## 2022-01-16 DIAGNOSIS — Y712 Prosthetic and other implants, materials and accessory cardiovascular devices associated with adverse incidents: Secondary | ICD-10-CM | POA: Diagnosis present

## 2022-01-16 DIAGNOSIS — I214 Non-ST elevation (NSTEMI) myocardial infarction: Secondary | ICD-10-CM

## 2022-01-16 DIAGNOSIS — Z801 Family history of malignant neoplasm of trachea, bronchus and lung: Secondary | ICD-10-CM | POA: Diagnosis not present

## 2022-01-16 DIAGNOSIS — E669 Obesity, unspecified: Secondary | ICD-10-CM | POA: Diagnosis not present

## 2022-01-16 DIAGNOSIS — Z6834 Body mass index (BMI) 34.0-34.9, adult: Secondary | ICD-10-CM | POA: Diagnosis not present

## 2022-01-16 DIAGNOSIS — Z87891 Personal history of nicotine dependence: Secondary | ICD-10-CM | POA: Diagnosis not present

## 2022-01-16 DIAGNOSIS — Z79899 Other long term (current) drug therapy: Secondary | ICD-10-CM | POA: Diagnosis not present

## 2022-01-16 DIAGNOSIS — Z8249 Family history of ischemic heart disease and other diseases of the circulatory system: Secondary | ICD-10-CM | POA: Diagnosis not present

## 2022-01-16 DIAGNOSIS — K219 Gastro-esophageal reflux disease without esophagitis: Secondary | ICD-10-CM | POA: Diagnosis not present

## 2022-01-16 DIAGNOSIS — Z7982 Long term (current) use of aspirin: Secondary | ICD-10-CM | POA: Diagnosis not present

## 2022-01-16 DIAGNOSIS — E039 Hypothyroidism, unspecified: Secondary | ICD-10-CM | POA: Diagnosis not present

## 2022-01-16 LAB — HEMOGLOBIN A1C
Hgb A1c MFr Bld: 6.1 % — ABNORMAL HIGH (ref 4.8–5.6)
Mean Plasma Glucose: 128 mg/dL

## 2022-01-16 LAB — CBC
HCT: 40.2 % (ref 39.0–52.0)
Hemoglobin: 13.7 g/dL (ref 13.0–17.0)
MCH: 31.5 pg (ref 26.0–34.0)
MCHC: 34.1 g/dL (ref 30.0–36.0)
MCV: 92.4 fL (ref 80.0–100.0)
Platelets: 172 10*3/uL (ref 150–400)
RBC: 4.35 MIL/uL (ref 4.22–5.81)
RDW: 13.5 % (ref 11.5–15.5)
WBC: 12.7 10*3/uL — ABNORMAL HIGH (ref 4.0–10.5)
nRBC: 0 % (ref 0.0–0.2)

## 2022-01-16 LAB — BASIC METABOLIC PANEL
Anion gap: 9 (ref 5–15)
BUN: 10 mg/dL (ref 6–20)
CO2: 26 mmol/L (ref 22–32)
Calcium: 8.7 mg/dL — ABNORMAL LOW (ref 8.9–10.3)
Chloride: 104 mmol/L (ref 98–111)
Creatinine, Ser: 1.15 mg/dL (ref 0.61–1.24)
GFR, Estimated: >60 mL/min (ref >60)
Glucose, Bld: 99 mg/dL (ref 70–99)
Potassium: 3.6 mmol/L (ref 3.5–5.1)
Sodium: 139 mmol/L (ref 135–145)

## 2022-01-16 LAB — ECHOCARDIOGRAM COMPLETE
AR max vel: 2.73 cm2
AV Peak grad: 6.7 mmHg
Ao pk vel: 1.29 m/s
Area-P 1/2: 3.6 cm2
Calc EF: 58.6 %
Height: 70 in
S' Lateral: 3.5 cm
Single Plane A2C EF: 60.1 %
Single Plane A4C EF: 58.6 %
Weight: 3796.8 oz

## 2022-01-16 LAB — LIPID PANEL
Cholesterol: 90 mg/dL (ref 0–200)
HDL: 30 mg/dL — ABNORMAL LOW (ref >40)
LDL Cholesterol: 33 mg/dL (ref 0–99)
Total CHOL/HDL Ratio: 3 RATIO
Triglycerides: 134 mg/dL (ref <150)
VLDL: 27 mg/dL (ref 0–40)

## 2022-01-16 LAB — HIV ANTIBODY (ROUTINE TESTING W REFLEX): HIV Screen 4th Generation wRfx: NONREACTIVE

## 2022-01-16 LAB — HEPARIN LEVEL (UNFRACTIONATED): Heparin Unfractionated: 0.47 IU/mL (ref 0.30–0.70)

## 2022-01-16 MED ORDER — SODIUM CHLORIDE 0.9 % WEIGHT BASED INFUSION
3.0000 mL/kg/h | INTRAVENOUS | Status: DC
Start: 2022-01-16 — End: 2022-01-16
  Administered 2022-01-16: 3 mL/kg/h via INTRAVENOUS

## 2022-01-16 MED ORDER — SODIUM CHLORIDE 0.9 % IV SOLN: 250.0000 mL | INTRAVENOUS | Status: DC | PRN

## 2022-01-16 MED ORDER — ASPIRIN 81 MG PO CHEW
81.0000 mg | CHEWABLE_TABLET | ORAL | Status: AC
  Administered 2022-01-17: 81 mg via ORAL
  Filled 2022-01-16: qty 1

## 2022-01-16 MED ORDER — IPRATROPIUM BROMIDE 0.02 % IN SOLN: 0.5000 mg | RESPIRATORY_TRACT | Status: DC | PRN

## 2022-01-16 MED ORDER — SODIUM CHLORIDE 0.9 % WEIGHT BASED INFUSION
1.0000 mL/kg/h | INTRAVENOUS | Status: DC
  Administered 2022-01-16 (×2): 1 mL/kg/h via INTRAVENOUS

## 2022-01-16 MED ORDER — SODIUM CHLORIDE 0.9% FLUSH: 3.0000 mL | INTRAVENOUS | Status: DC | PRN

## 2022-01-16 MED ORDER — PERFLUTREN LIPID MICROSPHERE
1.0000 mL | INTRAVENOUS | Status: AC | PRN
  Administered 2022-01-16: 2 mL via INTRAVENOUS
  Filled 2022-01-16: qty 10

## 2022-01-16 MED ORDER — SODIUM CHLORIDE 0.9 % WEIGHT BASED INFUSION
3.0000 mL/kg/h | INTRAVENOUS | Status: DC
Start: 2022-01-17 — End: 2022-01-17
  Administered 2022-01-17: 3 mL/kg/h via INTRAVENOUS

## 2022-01-16 MED ORDER — METOPROLOL TARTRATE 25 MG PO TABS
25.0000 mg | ORAL_TABLET | Freq: Two times a day (BID) | ORAL | Status: DC
  Filled 2022-01-16: qty 1

## 2022-01-16 MED ORDER — SODIUM CHLORIDE 0.9 % WEIGHT BASED INFUSION
1.0000 mL/kg/h | INTRAVENOUS | Status: DC
  Administered 2022-01-17: 1 mL/kg/h via INTRAVENOUS

## 2022-01-16 MED ORDER — SODIUM CHLORIDE 0.9% FLUSH
3.0000 mL | Freq: Two times a day (BID) | INTRAVENOUS | Status: DC
  Administered 2022-01-17: 3 mL via INTRAVENOUS

## 2022-01-16 NOTE — Progress Notes (Signed)
?   01/16/22 2232  ?Vitals  ?BP (!) 88/48  ?MAP (mmHg) (!) 60  ?Pulse Rate 85  ?MEWS COLOR  ?MEWS Score Color Yellow  ?MEWS Score  ?MEWS Temp 0  ?MEWS Systolic 1  ?MEWS Pulse 0  ?MEWS RR 1  ?MEWS LOC 0  ?MEWS Score 2  ? ?Medication held until provider is aware. Will notify provider. ?

## 2022-01-16 NOTE — Progress Notes (Signed)
? ?Progress Note ? ?Patient Name: James Huynh ?Date of Encounter: 01/16/2022 ? ?Primary Cardiologist:   Rollene Rotunda, MD ? ? ?Subjective  ? ?No chest pain currently.  He has baseline dyspnea.   ? ?Inpatient Medications  ?  ?Scheduled Meds: ? [START ON 01/17/2022] aspirin EC  81 mg Oral Daily  ? ezetimibe  10 mg Oral Daily  ? levothyroxine  150 mcg Oral Daily  ? pantoprazole  40 mg Oral Daily  ? rosuvastatin  40 mg Oral Daily  ? sodium chloride flush  3 mL Intravenous Q12H  ? ?Continuous Infusions: ? sodium chloride    ? sodium chloride 1 mL/kg/hr (01/16/22 0520)  ? heparin 1,200 Units/hr (01/16/22 0541)  ? ?PRN Meds: ?sodium chloride, acetaminophen, ipratropium, nitroGLYCERIN, ondansetron (ZOFRAN) IV, sodium chloride flush  ? ?Vital Signs  ?  ?Vitals:  ? 01/16/22 0039 01/16/22 0040 01/16/22 0150 01/16/22 0429  ?BP:  104/66 102/67 104/71  ?Pulse: 76 69 69 68  ?Resp: 19 19 17  (!) 23  ?Temp:  98.3 ?F (36.8 ?C) 97.7 ?F (36.5 ?C) 97.8 ?F (36.6 ?C)  ?TempSrc:  Oral Oral Oral  ?SpO2: 96% 94% 96% 95%  ?Weight:   107.6 kg   ?Height:   5\' 10"  (1.778 m)   ? ? ?Intake/Output Summary (Last 24 hours) at 01/16/2022 0811 ?Last data filed at 01/16/2022 0542 ?Gross per 24 hour  ?Intake 1061 ml  ?Output 0 ml  ?Net 1061 ml  ? ?Filed Weights  ? 01/15/22 1357 01/16/22 0150  ?Weight: 108.4 kg 107.6 kg  ? ? ?Telemetry  ?  ?NSR - Personally Reviewed ? ?ECG  ?  ?NA - Personally Reviewed ? ?Physical Exam  ? ?GEN: No acute distress.   ?Neck: No  JVD ?Cardiac: RRR, no murmurs, rubs, or gallops.  ?Respiratory: Clear  to auscultation bilaterally.  Mildly decreased breath sounds ?GI: Soft, nontender, non-distended  ?MS: No  edema; No deformity. ?Neuro:  Nonfocal  ?Psych: Normal affect  ? ?Labs  ?  ?Chemistry ?Recent Labs  ?Lab 01/15/22 ?1359 01/16/22 ?0413  ?NA 138 139  ?K 3.6 3.6  ?CL 104 104  ?CO2 27 26  ?GLUCOSE 82 99  ?BUN 10 10  ?CREATININE 1.14 1.15  ?CALCIUM 8.7* 8.7*  ?GFRNONAA >60 >60  ?ANIONGAP 7 9  ?  ? ?Hematology ?Recent Labs  ?Lab  01/15/22 ?1359 01/16/22 ?0413  ?WBC 11.9* 12.7*  ?RBC 4.67 4.35  ?HGB 14.6 13.7  ?HCT 43.4 40.2  ?MCV 92.9 92.4  ?MCH 31.3 31.5  ?MCHC 33.6 34.1  ?RDW 13.7 13.5  ?PLT 216 172  ? ? ?Cardiac EnzymesNo results for input(s): TROPONINI in the last 168 hours. No results for input(s): TROPIPOC in the last 168 hours.  ? ?BNPNo results for input(s): BNP, PROBNP in the last 168 hours.  ? ?DDimer No results for input(s): DDIMER in the last 168 hours.  ? ?Radiology  ?  ?DG Chest 2 View ? ?Result Date: 01/15/2022 ?CLINICAL DATA:  A 61 year old male. EXAM: CHEST - 2 VIEW COMPARISON:  Comparison made with December 20, 2020. FINDINGS: Trachea midline. Cardiomediastinal contours and hilar structures are normal. No lobar consolidation. No visible pneumothorax. No sign of pleural effusion. Slight added density along the LEFT heart border may been present previously, suspect this represents prominence of epicardial fat. Mild lingular scarring is also present based on previous CT of the chest. On limited assessment there is no acute skeletal process. EKG leads project over the chest. IMPRESSION: No active cardiopulmonary disease. Electronically  Signed   By: Donzetta Kohut M.D.   On: 01/15/2022 14:34   ? ?Cardiac Studies  ? ?ECHO/CATH :  Pending ? ?Patient Profile  ?   ?61 y.o. male with history of CAD with DES to both LCX and LAD in 2016 who is being seen 01/15/2022 for the evaluation of chest pain. ? ?Assessment & Plan  ?  ?NSTEMI:     Continue heparin.  Cath today.  Other meds as listed.  ? ?Dyslipidemia:  LDL 33.  Continue current therapy.  ? ? ? ?For questions or updates, please contact [facility] ?Please consult [email or web address] for contact info under Cardiology/STEMI. ?  ?Signed, ?Rollene Rotunda, MD  ?01/16/2022, 8:11 AM   ? ?

## 2022-01-16 NOTE — H&P (View-Only) (Signed)
? ?Progress Note ? ?Patient Name: James Huynh ?Date of Encounter: 01/16/2022 ? ?Primary Cardiologist:   Kaytlynne Neace, MD ? ? ?Subjective  ? ?No chest pain currently.  He has baseline dyspnea.   ? ?Inpatient Medications  ?  ?Scheduled Meds: ? [START ON 01/17/2022] aspirin EC  81 mg Oral Daily  ? ezetimibe  10 mg Oral Daily  ? levothyroxine  150 mcg Oral Daily  ? pantoprazole  40 mg Oral Daily  ? rosuvastatin  40 mg Oral Daily  ? sodium chloride flush  3 mL Intravenous Q12H  ? ?Continuous Infusions: ? sodium chloride    ? sodium chloride 1 mL/kg/hr (01/16/22 0520)  ? heparin 1,200 Units/hr (01/16/22 0541)  ? ?PRN Meds: ?sodium chloride, acetaminophen, ipratropium, nitroGLYCERIN, ondansetron (ZOFRAN) IV, sodium chloride flush  ? ?Vital Signs  ?  ?Vitals:  ? 01/16/22 0039 01/16/22 0040 01/16/22 0150 01/16/22 0429  ?BP:  104/66 102/67 104/71  ?Pulse: 76 69 69 68  ?Resp: 19 19 17 (!) 23  ?Temp:  98.3 ?F (36.8 ?C) 97.7 ?F (36.5 ?C) 97.8 ?F (36.6 ?C)  ?TempSrc:  Oral Oral Oral  ?SpO2: 96% 94% 96% 95%  ?Weight:   107.6 kg   ?Height:   5' 10" (1.778 m)   ? ? ?Intake/Output Summary (Last 24 hours) at 01/16/2022 0811 ?Last data filed at 01/16/2022 0542 ?Gross per 24 hour  ?Intake 1061 ml  ?Output 0 ml  ?Net 1061 ml  ? ?Filed Weights  ? 01/15/22 1357 01/16/22 0150  ?Weight: 108.4 kg 107.6 kg  ? ? ?Telemetry  ?  ?NSR - Personally Reviewed ? ?ECG  ?  ?NA - Personally Reviewed ? ?Physical Exam  ? ?GEN: No acute distress.   ?Neck: No  JVD ?Cardiac: RRR, no murmurs, rubs, or gallops.  ?Respiratory: Clear  to auscultation bilaterally.  Mildly decreased breath sounds ?GI: Soft, nontender, non-distended  ?MS: No  edema; No deformity. ?Neuro:  Nonfocal  ?Psych: Normal affect  ? ?Labs  ?  ?Chemistry ?Recent Labs  ?Lab 01/15/22 ?1359 01/16/22 ?0413  ?NA 138 139  ?K 3.6 3.6  ?CL 104 104  ?CO2 27 26  ?GLUCOSE 82 99  ?BUN 10 10  ?CREATININE 1.14 1.15  ?CALCIUM 8.7* 8.7*  ?GFRNONAA >60 >60  ?ANIONGAP 7 9  ?  ? ?Hematology ?Recent Labs  ?Lab  01/15/22 ?1359 01/16/22 ?0413  ?WBC 11.9* 12.7*  ?RBC 4.67 4.35  ?HGB 14.6 13.7  ?HCT 43.4 40.2  ?MCV 92.9 92.4  ?MCH 31.3 31.5  ?MCHC 33.6 34.1  ?RDW 13.7 13.5  ?PLT 216 172  ? ? ?Cardiac EnzymesNo results for input(s): TROPONINI in the last 168 hours. No results for input(s): TROPIPOC in the last 168 hours.  ? ?BNPNo results for input(s): BNP, PROBNP in the last 168 hours.  ? ?DDimer No results for input(s): DDIMER in the last 168 hours.  ? ?Radiology  ?  ?DG Chest 2 View ? ?Result Date: 01/15/2022 ?CLINICAL DATA:  A 60-year-old male. EXAM: CHEST - 2 VIEW COMPARISON:  Comparison made with December 20, 2020. FINDINGS: Trachea midline. Cardiomediastinal contours and hilar structures are normal. No lobar consolidation. No visible pneumothorax. No sign of pleural effusion. Slight added density along the LEFT heart border may been present previously, suspect this represents prominence of epicardial fat. Mild lingular scarring is also present based on previous CT of the chest. On limited assessment there is no acute skeletal process. EKG leads project over the chest. IMPRESSION: No active cardiopulmonary disease. Electronically   Signed   By: Donzetta Kohut M.D.   On: 01/15/2022 14:34   ? ?Cardiac Studies  ? ?ECHO/CATH :  Pending ? ?Patient Profile  ?   ?61 y.o. male with history of CAD with DES to both LCX and LAD in 2016 who is being seen 01/15/2022 for the evaluation of chest pain. ? ?Assessment & Plan  ?  ?NSTEMI:     Continue heparin.  Cath today.  Other meds as listed.  ? ?Dyslipidemia:  LDL 33.  Continue current therapy.  ? ? ? ?For questions or updates, please contact [facility] ?Please consult [email or web address] for contact info under Cardiology/STEMI. ?  ?Signed, ?Rollene Rotunda, MD  ?01/16/2022, 8:11 AM   ? ?

## 2022-01-16 NOTE — Telephone Encounter (Signed)
Pt called from hospital regarding procedure that he was suppose to have done today. Pt was very upset do to the fact the he hasn't eaten since 6 pm yesterday ( 4/3) and still had not had procedure done. He wanted to reschedule procedure. I spoke with Triage and they stated that since in was currently in the hospital that he needed to speak with his nurse. I informed pt of this and he stated that he would notify a nurse supervisor of this issue but wanted Dr. Antoine Poche to be aware also. ?

## 2022-01-16 NOTE — Progress Notes (Signed)
Spoke w/ pt re: cath lab scheduling; offered support; pt had been eating a bag of M&M's from somewhere. No longer NPO.  Cath lab notified of pt's request to cancel the cath for today and reschedule. Pt stated had been without food (NPO) for too long. Pt MD notified. Pt to be rescheduled for tomorrow. I explained that I did not have a time for his cath tomorrow but that it could be late in the day, uncertain of time. PT was appreciative of juice and crackers; tray ordered; pt verbalized appreciation as well as frustration for communication issues. --JM  ?

## 2022-01-16 NOTE — Progress Notes (Signed)
Patient arrived to unit,  VSS, cardiac telemetry initiated and verified (NSR). Patient prepped for cardiac cath later this afternoon.  ? ?Patient has 2 inhalers with him-listed on home medication list, but not on MAR. I told patient that I would look at his inhalers to see if we have an order for them and if not, then we can try to get an order. When I took the inhalers to the computer, the patient was really mad and going on about how no one is taking his inhalers "not even the president or he will get punched in the face". I explained to the patient again, that no one is taking his inhalers, but I just need to see the medication names in order to get an order.  ?Cardiology was paged about home inhalers.  ? ?Patient will need inhaler orders or education on why he can't have them. ?

## 2022-01-16 NOTE — Progress Notes (Addendum)
Received signout from cath lab that patient declined to remain NPO any longer for his cardiac cath and ate, therefore cath lab deferred cath until tomorrow. [facility] has reposted case for tomorrow, case time TBD at this time. Re-entered orders in accordance with previous recommendations. MD updated. ?

## 2022-01-16 NOTE — Progress Notes (Signed)
Patient requesting home dose of medication-metoprolol. Provider notified. ?

## 2022-01-16 NOTE — Telephone Encounter (Signed)
Patient has a cath scheduled tomorrow. He is currently in patient.  ?

## 2022-01-16 NOTE — Plan of Care (Signed)
  Problem: Education: Goal: Ability to demonstrate management of disease process will improve Outcome: Progressing   Problem: Activity: Goal: Capacity to carry out activities will improve Outcome: Progressing   

## 2022-01-16 NOTE — TOC Progression Note (Addendum)
Transition of Care (TOC) - Progression Note  ? ? ?Patient Details  ?Name: James Huynh ?MRN: [id number] ?Date of Birth: 01/24/1961 ? ?Transition of Care (TOC) CM/SW Contact  ?Leone Haven, RN ?Phone Number: ?01/16/2022, 11:19 AM ? ?Clinical Narrative:    ?from home, indep, chest pain, for cath lab today.  TOC will continue to follow for dc needs.  ? ?Benefit check for brilinta in process. Copay is 4.00, he has Medicaid. ? ? ?  ?  ? ?Expected Discharge Plan and Services ?  ?  ?  ?  ?  ?                ?  ?  ?  ?  ?  ?  ?  ?  ?  ?  ? ? ?Social Determinants of Health (SDOH) Interventions ?  ? ?Readmission Risk Interventions ?   ? View : No data to display.  ?  ?  ?  ? ? ?

## 2022-01-16 NOTE — Plan of Care (Signed)
?  Problem: Education: ?Goal: Ability to demonstrate management of disease process will improve ?Outcome: Progressing ?  ?Problem: Education: ?Goal: Ability to verbalize understanding of medication therapies will improve ?Outcome: Progressing ?  ?Problem: Cardiac: ?Goal: Ability to achieve and maintain adequate cardiopulmonary perfusion will improve ?Outcome: Progressing ?  ?

## 2022-01-17 ENCOUNTER — Other Ambulatory Visit (HOSPITAL_COMMUNITY): Payer: Self-pay

## 2022-01-17 ENCOUNTER — Encounter (HOSPITAL_COMMUNITY): Payer: Self-pay | Admitting: Cardiology

## 2022-01-17 ENCOUNTER — Inpatient Hospital Stay (HOSPITAL_COMMUNITY)
Admission: EM | Disposition: A | Discharge status: Home / Self Care | Payer: Self-pay | Source: Home / Self Care | Attending: Cardiology

## 2022-01-17 DIAGNOSIS — Z79890 Hormone replacement therapy (postmenopausal): Secondary | ICD-10-CM | POA: Diagnosis not present

## 2022-01-17 DIAGNOSIS — Z79899 Other long term (current) drug therapy: Secondary | ICD-10-CM | POA: Diagnosis not present

## 2022-01-17 DIAGNOSIS — J449 Chronic obstructive pulmonary disease, unspecified: Secondary | ICD-10-CM | POA: Diagnosis present

## 2022-01-17 DIAGNOSIS — Z880 Allergy status to penicillin: Secondary | ICD-10-CM | POA: Diagnosis not present

## 2022-01-17 DIAGNOSIS — E039 Hypothyroidism, unspecified: Secondary | ICD-10-CM | POA: Diagnosis present

## 2022-01-17 DIAGNOSIS — K219 Gastro-esophageal reflux disease without esophagitis: Secondary | ICD-10-CM | POA: Diagnosis present

## 2022-01-17 DIAGNOSIS — I21A9 Other myocardial infarction type: Secondary | ICD-10-CM | POA: Diagnosis present

## 2022-01-17 DIAGNOSIS — T82855A Stenosis of coronary artery stent, initial encounter: Secondary | ICD-10-CM | POA: Diagnosis present

## 2022-01-17 DIAGNOSIS — I214 Non-ST elevation (NSTEMI) myocardial infarction: Secondary | ICD-10-CM | POA: Diagnosis present

## 2022-01-17 DIAGNOSIS — Z6834 Body mass index (BMI) 34.0-34.9, adult: Secondary | ICD-10-CM | POA: Diagnosis not present

## 2022-01-17 DIAGNOSIS — Z20822 Contact with and (suspected) exposure to covid-19: Secondary | ICD-10-CM | POA: Diagnosis present

## 2022-01-17 DIAGNOSIS — Y712 Prosthetic and other implants, materials and accessory cardiovascular devices associated with adverse incidents: Secondary | ICD-10-CM | POA: Diagnosis present

## 2022-01-17 DIAGNOSIS — I2510 Atherosclerotic heart disease of native coronary artery without angina pectoris: Secondary | ICD-10-CM

## 2022-01-17 DIAGNOSIS — E7800 Pure hypercholesterolemia, unspecified: Secondary | ICD-10-CM | POA: Diagnosis present

## 2022-01-17 DIAGNOSIS — Z7982 Long term (current) use of aspirin: Secondary | ICD-10-CM | POA: Diagnosis not present

## 2022-01-17 DIAGNOSIS — Z87891 Personal history of nicotine dependence: Secondary | ICD-10-CM | POA: Diagnosis not present

## 2022-01-17 DIAGNOSIS — Z8249 Family history of ischemic heart disease and other diseases of the circulatory system: Secondary | ICD-10-CM | POA: Diagnosis not present

## 2022-01-17 DIAGNOSIS — Z801 Family history of malignant neoplasm of trachea, bronchus and lung: Secondary | ICD-10-CM | POA: Diagnosis not present

## 2022-01-17 DIAGNOSIS — E669 Obesity, unspecified: Secondary | ICD-10-CM | POA: Diagnosis present

## 2022-01-17 HISTORY — PX: CORONARY STENT INTERVENTION: CATH118234

## 2022-01-17 HISTORY — PX: LEFT HEART CATH AND CORONARY ANGIOGRAPHY: CATH118249

## 2022-01-17 LAB — CBC
HCT: 37.9 % — ABNORMAL LOW (ref 39.0–52.0)
Hemoglobin: 12.9 g/dL — ABNORMAL LOW (ref 13.0–17.0)
MCH: 31.2 pg (ref 26.0–34.0)
MCHC: 34 g/dL (ref 30.0–36.0)
MCV: 91.8 fL (ref 80.0–100.0)
Platelets: 155 10*3/uL (ref 150–400)
RBC: 4.13 MIL/uL — ABNORMAL LOW (ref 4.22–5.81)
RDW: 13.3 % (ref 11.5–15.5)
WBC: 9.9 10*3/uL (ref 4.0–10.5)
nRBC: 0 % (ref 0.0–0.2)

## 2022-01-17 LAB — BASIC METABOLIC PANEL
Anion gap: 7 (ref 5–15)
BUN: 8 mg/dL (ref 6–20)
CO2: 24 mmol/L (ref 22–32)
Calcium: 8.4 mg/dL — ABNORMAL LOW (ref 8.9–10.3)
Chloride: 107 mmol/L (ref 98–111)
Creatinine, Ser: 1.11 mg/dL (ref 0.61–1.24)
GFR, Estimated: >60 mL/min (ref >60)
Glucose, Bld: 105 mg/dL — ABNORMAL HIGH (ref 70–99)
Potassium: 3.6 mmol/L (ref 3.5–5.1)
Sodium: 138 mmol/L (ref 135–145)

## 2022-01-17 LAB — POCT ACTIVATED CLOTTING TIME: Activated Clotting Time: 293 seconds

## 2022-01-17 LAB — HEPARIN LEVEL (UNFRACTIONATED): Heparin Unfractionated: 0.31 IU/mL (ref 0.30–0.70)

## 2022-01-17 SURGERY — LEFT HEART CATH AND CORONARY ANGIOGRAPHY
Anesthesia: LOCAL

## 2022-01-17 MED ORDER — ASPIRIN 81 MG PO CHEW
81.0000 mg | CHEWABLE_TABLET | Freq: Every day | ORAL | Status: DC
  Administered 2022-01-18: 81 mg via ORAL
  Filled 2022-01-17: qty 1

## 2022-01-17 MED ORDER — ONDANSETRON HCL 4 MG/2ML IJ SOLN
INTRAMUSCULAR | Status: AC
  Filled 2022-01-17: qty 2

## 2022-01-17 MED ORDER — MORPHINE SULFATE (PF) 2 MG/ML IV SOLN
2.0000 mg | Freq: Once | INTRAVENOUS | Status: AC
  Administered 2022-01-17: 2 mg via INTRAVENOUS

## 2022-01-17 MED ORDER — TICAGRELOR 90 MG PO TABS
ORAL_TABLET | ORAL | Status: DC | PRN
  Administered 2022-01-17: 180 mg via ORAL

## 2022-01-17 MED ORDER — HEPARIN SODIUM (PORCINE) 1000 UNIT/ML IJ SOLN
INTRAMUSCULAR | Status: AC
  Filled 2022-01-17: qty 10

## 2022-01-17 MED ORDER — SODIUM CHLORIDE 0.9 % IV SOLN: 250.0000 mL | INTRAVENOUS | Status: DC | PRN

## 2022-01-17 MED ORDER — VERAPAMIL HCL 2.5 MG/ML IV SOLN
INTRAVENOUS | Status: AC
  Filled 2022-01-17: qty 2

## 2022-01-17 MED ORDER — METOPROLOL SUCCINATE ER 25 MG PO TB24
25.0000 mg | ORAL_TABLET | Freq: Every day | ORAL | Status: DC
  Administered 2022-01-17: 25 mg via ORAL
  Filled 2022-01-17: qty 1

## 2022-01-17 MED ORDER — MIDAZOLAM HCL 2 MG/2ML IJ SOLN
INTRAMUSCULAR | Status: DC | PRN
  Administered 2022-01-17 (×2): 1 mg via INTRAVENOUS

## 2022-01-17 MED ORDER — VERAPAMIL HCL 2.5 MG/ML IV SOLN
INTRAVENOUS | Status: DC | PRN
  Administered 2022-01-17: 10 mL via INTRA_ARTERIAL

## 2022-01-17 MED ORDER — TIROFIBAN HCL IN NACL 5-0.9 MG/100ML-% IV SOLN
INTRAVENOUS | Status: AC
  Filled 2022-01-17: qty 100

## 2022-01-17 MED ORDER — TICAGRELOR 90 MG PO TABS
ORAL_TABLET | ORAL | Status: AC
  Filled 2022-01-17: qty 2

## 2022-01-17 MED ORDER — ISOSORBIDE MONONITRATE ER 30 MG PO TB24: 15.0000 mg | ORAL_TABLET | Freq: Every day | ORAL | Status: DC

## 2022-01-17 MED ORDER — HEPARIN (PORCINE) IN NACL 1000-0.9 UT/500ML-% IV SOLN
INTRAVENOUS | Status: AC
  Filled 2022-01-17: qty 1000

## 2022-01-17 MED ORDER — HEPARIN SODIUM (PORCINE) 1000 UNIT/ML IJ SOLN
INTRAMUSCULAR | Status: DC | PRN
Start: 2022-01-17 — End: 2022-01-17
  Administered 2022-01-17: 6000 [IU] via INTRAVENOUS
  Administered 2022-01-17: 5000 [IU] via INTRAVENOUS

## 2022-01-17 MED ORDER — SODIUM CHLORIDE 0.9% FLUSH: 3.0000 mL | INTRAVENOUS | Status: DC | PRN

## 2022-01-17 MED ORDER — SODIUM CHLORIDE 0.9 % IV SOLN: INTRAVENOUS | Status: AC

## 2022-01-17 MED ORDER — MIDAZOLAM HCL 2 MG/2ML IJ SOLN
INTRAMUSCULAR | Status: AC
  Filled 2022-01-17: qty 2

## 2022-01-17 MED ORDER — FENTANYL CITRATE (PF) 100 MCG/2ML IJ SOLN
INTRAMUSCULAR | Status: AC
  Filled 2022-01-17: qty 2

## 2022-01-17 MED ORDER — FENTANYL CITRATE (PF) 100 MCG/2ML IJ SOLN
INTRAMUSCULAR | Status: DC | PRN
  Administered 2022-01-17 (×2): 50 ug via INTRAVENOUS

## 2022-01-17 MED ORDER — SODIUM CHLORIDE 0.9% FLUSH
3.0000 mL | Freq: Two times a day (BID) | INTRAVENOUS | Status: DC
  Administered 2022-01-17: 3 mL via INTRAVENOUS

## 2022-01-17 MED ORDER — TIROFIBAN (AGGRASTAT) BOLUS VIA INFUSION
INTRAVENOUS | Status: DC | PRN
  Administered 2022-01-17: 2707.5 ug via INTRAVENOUS

## 2022-01-17 MED ORDER — ENOXAPARIN SODIUM 40 MG/0.4ML IJ SOSY: 40.0000 mg | PREFILLED_SYRINGE | INTRAMUSCULAR | Status: DC

## 2022-01-17 MED ORDER — MORPHINE SULFATE (PF) 2 MG/ML IV SOLN
INTRAVENOUS | Status: AC
  Filled 2022-01-17: qty 1

## 2022-01-17 MED ORDER — LIDOCAINE HCL (PF) 1 % IJ SOLN
INTRAMUSCULAR | Status: AC
  Filled 2022-01-17: qty 30

## 2022-01-17 MED ORDER — HEPARIN (PORCINE) IN NACL 1000-0.9 UT/500ML-% IV SOLN
INTRAVENOUS | Status: DC | PRN
Start: 2022-01-17 — End: 2022-01-17
  Administered 2022-01-17 (×2): 500 mL

## 2022-01-17 MED ORDER — LIDOCAINE HCL (PF) 1 % IJ SOLN
INTRAMUSCULAR | Status: DC | PRN
  Administered 2022-01-17: 2 mL
  Administered 2022-01-17: 4 mL

## 2022-01-17 MED ORDER — TICAGRELOR 90 MG PO TABS
90.0000 mg | ORAL_TABLET | Freq: Two times a day (BID) | ORAL | Status: DC
  Administered 2022-01-17 – 2022-01-18 (×2): 90 mg via ORAL
  Filled 2022-01-17 (×2): qty 1

## 2022-01-17 MED ORDER — IOHEXOL 350 MG/ML SOLN
INTRAVENOUS | Status: DC | PRN
  Administered 2022-01-17: 80 mL

## 2022-01-17 SURGICAL SUPPLY — 19 items
BALLN SAPPHIRE 2.5X12 (BALLOONS) ×2
BALLN ~~LOC~~ EUPHORA RX 4.5X15 (BALLOONS) ×2
BALLOON SAPPHIRE 2.5X12 (BALLOONS) IMPLANT
BALLOON ~~LOC~~ EUPHORA RX 4.5X15 (BALLOONS) IMPLANT
BAND ZEPHYR COMPRESS 30 LONG (HEMOSTASIS) ×1 IMPLANT
CATH EXTRAC PRONTO 5.5F 138CM (CATHETERS) ×1 IMPLANT
CATH INFINITI 5FR JK (CATHETERS) ×1 IMPLANT
CATH VISTA GUIDE 6FR JR4 (CATHETERS) ×1 IMPLANT
ELECT DEFIB PAD ADLT CADENCE (PAD) ×1 IMPLANT
GLIDESHEATH SLEND SS 6F .021 (SHEATH) ×1 IMPLANT
GUIDEWIRE INQWIRE 1.5J.035X260 (WIRE) IMPLANT
INQWIRE 1.5J .035X260CM (WIRE) ×2
KIT ENCORE 26 ADVANTAGE (KITS) ×1 IMPLANT
KIT HEART LEFT (KITS) ×2 IMPLANT
PACK CARDIAC CATHETERIZATION (CUSTOM PROCEDURE TRAY) ×2 IMPLANT
STENT ONYX FRONTIER 4.0X22 (Permanent Stent) ×1 IMPLANT
TRANSDUCER W/STOPCOCK (MISCELLANEOUS) ×2 IMPLANT
TUBING CIL FLEX 10 FLL-RA (TUBING) ×2 IMPLANT
WIRE RUNTHROUGH .014X180CM (WIRE) ×1 IMPLANT

## 2022-01-17 NOTE — Progress Notes (Addendum)
? ?Progress Note ? ?Patient Name: James Huynh ?Date of Encounter: 01/17/2022 ? ?[facility] Cardiologist: Rollene RotundaJames Jakhiya Brower, MD  ? ?Subjective  ? ?Patient is seeing at cath lab holding area, he is eating sandwich, he states he no longer has any chest discomfort. He denied any SOB. He states his sister died from MI 2 years ago. He reports that he is not taking Imdur at home, only takes Metoprolol and Lasix.  ? ?Inpatient Medications  ?  ?Scheduled Meds: ? [MAR Hold] ezetimibe  10 mg Oral Daily  ? isosorbide mononitrate  15 mg Oral QHS  ? [MAR Hold] levothyroxine  150 mcg Oral Daily  ? metoprolol succinate  25 mg Oral QHS  ? [MAR Hold] pantoprazole  40 mg Oral Daily  ? [MAR Hold] rosuvastatin  40 mg Oral Daily  ? [MAR Hold] sodium chloride flush  3 mL Intravenous Q12H  ? ?Continuous Infusions: ? sodium chloride    ? sodium chloride 75 mL/hr at 01/17/22 0943  ? sodium chloride 1 mL/kg/hr (01/17/22 0518)  ? heparin 1,200 Units/hr (01/17/22 0518)  ? ?PRN Meds: ?sodium chloride, [MAR Hold] acetaminophen, fentaNYL, Heparin (Porcine) in NaCl, heparin sodium (porcine), iohexol, [MAR Hold] ipratropium, lidocaine (PF), midazolam, [MAR Hold] nitroGLYCERIN, [MAR Hold] ondansetron (ZOFRAN) IV, Radial Cocktail/Verapamil only, sodium chloride flush, ticagrelor, tirofiban  ? ?Vital Signs  ?  ?Vitals:  ? 01/17/22 1000 01/17/22 1015 01/17/22 1030 01/17/22 1045  ?BP: 131/83 (!) 144/79 114/74 115/84  ?Pulse: (!) 53 (!) 50 (!) 56 (!) 50  ?Resp: (!) 21 15 (!) 21 17  ?Temp:      ?TempSrc:      ?SpO2: 97% 97% 99% 98%  ?Weight:      ?Height:      ? ? ?Intake/Output Summary (Last 24 hours) at 01/17/2022 1118 ?Last data filed at 01/17/2022 1100 ?Gross per 24 hour  ?Intake 794.49 ml  ?Output 1365 ml  ?Net -570.51 ml  ? ? ?  01/17/2022  ?  5:22 AM 01/16/2022  ?  1:50 AM 01/15/2022  ?  1:57 PM  ?Last 3 Weights  ?Weight (lbs) 238 lb 12.1 oz 237 lb 4.8 oz 239 lb  ?Weight (kg) 108.3 kg 107.639 kg 108.41 kg  ?   ? ?Telemetry  ?  ?SR 70s - Personally  Reviewed ? ?ECG  ?  ?EKG today with NSR 64 bpm, chronic TWI of anterior leads unchanged  - Personally Reviewed ? ?Physical Exam  ? ?GEN: No acute distress.   ?Neck: No JVD ?Cardiac: RRR, no murmurs, rubs, or gallops.  ?Respiratory: Clear to auscultation bilaterally. On room air.  ?GI: Soft, nontender, non-distended  ?MS: No edema; Right wrist with TR band in place, right hand is warm without neurovascular deficit  ?Neuro:  Nonfocal  ?Psych: Normal affect  ? ?Labs  ?  ?High Sensitivity Troponin:   ?Recent Labs  ?Lab 01/15/22 ?1359 01/15/22 ?1536  ?TROPONINIHS 279* 282*  ?   ?Chemistry ?Recent Labs  ?Lab 01/15/22 ?1359 01/16/22 ?0413 01/17/22 ?0323  ?NA 138 139 138  ?K 3.6 3.6 3.6  ?CL 104 104 107  ?CO2 27 26 24   ?GLUCOSE 82 99 105*  ?BUN 10 10 8   ?CREATININE 1.14 1.15 1.11  ?CALCIUM 8.7* 8.7* 8.4*  ?GFRNONAA >60 >60 >60  ?ANIONGAP 7 9 7   ?  ?Lipids  ?Recent Labs  ?Lab 01/16/22 ?0413  ?CHOL 90  ?TRIG 134  ?HDL 30*  ?LDLCALC 33  ?CHOLHDL 3.0  ?  ?Hematology ?Recent Labs  ?Lab  01/15/22 ?1359 01/16/22 ?0413 01/17/22 ?7673  ?WBC 11.9* 12.7* 9.9  ?RBC 4.67 4.35 4.13*  ?HGB 14.6 13.7 12.9*  ?HCT 43.4 40.2 37.9*  ?MCV 92.9 92.4 91.8  ?MCH 31.3 31.5 31.2  ?MCHC 33.6 34.1 34.0  ?RDW 13.7 13.5 13.3  ?PLT 216 172 155  ? ?Thyroid No results for input(s): TSH, FREET4 in the last 168 hours.  ?BNPNo results for input(s): BNP, PROBNP in the last 168 hours.  ?DDimer No results for input(s): DDIMER in the last 168 hours.  ? ?Radiology  ?  ?DG Chest 2 View ? ?Result Date: 01/15/2022 ?CLINICAL DATA:  A 61 year old male. EXAM: CHEST - 2 VIEW COMPARISON:  Comparison made with December 20, 2020. FINDINGS: Trachea midline. Cardiomediastinal contours and hilar structures are normal. No lobar consolidation. No visible pneumothorax. No sign of pleural effusion. Slight added density along the LEFT heart border may been present previously, suspect this represents prominence of epicardial fat. Mild lingular scarring is also present based on previous CT  of the chest. On limited assessment there is no acute skeletal process. EKG leads project over the chest. IMPRESSION: No active cardiopulmonary disease. Electronically Signed   By: Donzetta Kohut M.D.   On: 01/15/2022 14:34  ? ?CARDIAC CATHETERIZATION ? ?Result Date: 01/17/2022 ?  Mid Cx lesion is 60% stenosed.   Prox LAD lesion is 30% stenosed.   Prox RCA lesion is 99% stenosed.   Prox RCA to Mid RCA lesion is 20% stenosed.   A drug-eluting stent was successfully placed using a STENT ONYX FRONTIER 4.0X22.   Post intervention, there is a 0% residual stenosis. 1.  Patent LAD and left circumflex stents with mild in-stent restenosis in the LAD and moderate restenosis in the left circumflex.  New 99% stenosis in the proximal LAD seems to be the culprit for an myocardial infarction with evidence of plaque rupture and large thrombus.  Significant ectasia in the proximal and mid right coronary artery. 2.  Left ventricular angiography was not performed.  EF was normal by echo.  Mildly elevated left ventricular end-diastolic pressure. 3.  Successful aspiration thrombectomy, balloon angioplasty and drug-eluting stent placement to the right coronary artery. Recommendations: Dual antiplatelet therapy for 12 months. Aggressive treatment of risk factors. I discussed with him the importance of smoking cessation. The left circumflex in-stent restenosis can likely be managed medically.  However, if the patient has refractory angina, FFR based evaluation can be considered. The patient had some chest discomfort with balloon inflation done after the procedure with no EKG changes.  Recommend observation overnight and likely discharge home tomorrow if no issues.  ? ?ECHOCARDIOGRAM COMPLETE ? ?Result Date: 01/16/2022 ?   ECHOCARDIOGRAM REPORT   Patient Name:   James Huynh Date of Exam: 01/16/2022 Medical Rec #:  [id number]         Height:       70.0 in Accession #:    [id number]        Weight:       237.3 lb Date of Birth:  [birth year]         BSA:          2.245 m? Patient Age:    60 years          BP:           104/66 mmHg Patient Gender: M                 HR:  63 bpm. Exam Location:  Inpatient Procedure: 2D Echo, Cardiac Doppler, Color Doppler and Intracardiac            Opacification Agent Indications:    Chest pain  History:        Patient has prior history of Echocardiogram examinations. CAD,                 COPD; Risk Factors:Dyslipidemia.  Sonographer:    Cleatis Polka Referring Phys: [id number] Dorothe Pea BRANCH  Sonographer Comments: Image acquisition challenging due to patient body habitus. IMPRESSIONS  1. Left ventricular ejection fraction, by estimation, is 60 to 65%. The left ventricle has normal function. The left ventricle has no regional wall motion abnormalities. Left ventricular diastolic parameters were normal.  2. Right ventricular systolic function is normal. The right ventricular size is normal.  3. Left atrial size was mildly dilated.  4. The pericardial effusion is circumferential.  5. The mitral valve is normal in structure. No evidence of mitral valve regurgitation. No evidence of mitral stenosis.  6. The aortic valve is normal in structure. Aortic valve regurgitation is not visualized. No aortic stenosis is present.  7. The inferior vena cava is normal in size with greater than 50% respiratory variability, suggesting right atrial pressure of 3 mmHg. Comparison(s): No significant change from prior study. Prior images reviewed side by side. FINDINGS  Left Ventricle: Left ventricular ejection fraction, by estimation, is 60 to 65%. The left ventricle has normal function. The left ventricle has no regional wall motion abnormalities. The left ventricular internal cavity size was normal in size. There is  no left ventricular hypertrophy. Left ventricular diastolic parameters were normal. Right Ventricle: The right ventricular size is normal. No increase in right ventricular wall thickness. Right ventricular systolic function is  normal. Left Atrium: Left atrial size was mildly dilated. Right Atrium: Right atrial size was normal in size. Pericardium: Trivial pericardial effusion is present. The pericardial effusion is circumferen

## 2022-01-17 NOTE — Progress Notes (Signed)
Patient refuses to right groin area shaved. He doesn't want cath procedure done through his groin site. ?

## 2022-01-17 NOTE — TOC Benefit Eligibility Note (Signed)
Patient Advocate Encounter  Insurance verification completed.    The patient is currently admitted and upon discharge could be taking Brilinta 90 mg.  The current 30 day co-pay is, $4.00.   The patient is insured through [employer] [place] [employer]     Estel Scholze, CPhT Pharmacy Patient Advocate Specialist [facility] Patient Advocate Team Direct Number: [phone]  Fax: [phone]        

## 2022-01-17 NOTE — Progress Notes (Signed)
3cc removed from BAnd; 4 cc left in band; R hand neurovascular status is intact; patient is without complaints. Report called to Ray Rn on 6 E - transferred to floor  RN and tele. In stable condition.  ?

## 2022-01-17 NOTE — Progress Notes (Signed)
R wrist neurovascular status intact, cap refill less than 3 seconds, pt. Ate [place] sandwich; waveform on pleath positive with barbeu test; r hand without paraesthesia; made aware waiting on bed.  ?

## 2022-01-17 NOTE — Interval H&P Note (Signed)
History and Physical Interval Note: ? ?01/17/2022 ?8:29 AM ? ?James Huynh  has presented today for surgery, with the diagnosis of nstemi.  The various methods of treatment have been discussed with the patient and family. After consideration of risks, benefits and other options for treatment, the patient has consented to  Procedure(s): ?LEFT HEART CATH AND CORONARY ANGIOGRAPHY (N/A) as a surgical intervention.  The patient's history has been reviewed, patient examined, no change in status, stable for surgery.  I have reviewed the patient's chart and labs.  Questions were answered to the patient's satisfaction.   ? ? ?Lorine Bears ? ? ?

## 2022-01-17 NOTE — Progress Notes (Signed)
ANTICOAGULATION CONSULT NOTE ? ?Pharmacy Consult for Heparin ?Indication: chest pain/ACS ? ?Allergies  ?Allergen Reactions  ? Penicillins Anaphylaxis  ? ? ?Patient Measurements: ?Height: 5\' 10"  (177.8 cm) ?Weight: 108.3 kg (238 lb 12.1 oz) ?IBW/kg (Calculated) : 73 ?HEPARIN DW (KG): 96.2  ? ?Vital Signs: ?Temp: 97.7 ?F (36.5 ?C) (04/05 0715) ?Temp Source: Oral (04/05 0715) ?BP: 112/65 (04/05 0715) ?Pulse Rate: 67 (04/05 0715) ? ?Labs: ?Recent Labs  ?  01/15/22 ?1359 01/15/22 ?1536 01/15/22 ?2223 01/16/22 ?0413 01/17/22 ?03/19/22  ?HGB 14.6  --   --  13.7 12.9*  ?HCT 43.4  --   --  40.2 37.9*  ?PLT 216  --   --  172 155  ?HEPARINUNFRC  --   --  0.39 0.47 0.31  ?CREATININE 1.14  --   --  1.15 1.11  ?TROPONINIHS 279* 282*  --   --   --   ? ? ? ?Estimated Creatinine Clearance: 87.2 mL/min (by C-G formula based on SCr of 1.11 mg/dL). ? ? ?Assessment: ?61 year old male with history of coronary artery disease, hyperlipidemia, NSTEMI with stent placement in 2016, tobacco use disorder current daily smoker, COPD, morbid obesity presenting today with chest pain. Not on oral anticoagulants. Pharmacy asked to start heparin. ? ?Heparin level therapeutic ?CBC stable ? ?Goal of Therapy:  ?Heparin level 0.3-0.7 units/ml ?Monitor platelets by anticoagulation protocol: Yes ?  ?Plan:  ?Continue heparin infusion at 1200 units/hr ?F/u daily heparin level and CBC ? ?? Cath today ? ?Thank you ?2017, PharmD ?01/17/2022,8:00 AM ? ? ?

## 2022-01-17 NOTE — Progress Notes (Signed)
Nauseated; medicated w/zofran ?

## 2022-01-18 ENCOUNTER — Encounter (HOSPITAL_COMMUNITY): Payer: Self-pay | Admitting: Cardiovascular Disease

## 2022-01-18 ENCOUNTER — Other Ambulatory Visit (HOSPITAL_COMMUNITY): Payer: Self-pay

## 2022-01-18 ENCOUNTER — Encounter: Payer: Self-pay | Admitting: Cardiology

## 2022-01-18 DIAGNOSIS — Z20822 Contact with and (suspected) exposure to covid-19: Secondary | ICD-10-CM | POA: Diagnosis not present

## 2022-01-18 DIAGNOSIS — I21A9 Other myocardial infarction type: Secondary | ICD-10-CM | POA: Diagnosis not present

## 2022-01-18 DIAGNOSIS — T82855A Stenosis of coronary artery stent, initial encounter: Secondary | ICD-10-CM | POA: Diagnosis not present

## 2022-01-18 DIAGNOSIS — J449 Chronic obstructive pulmonary disease, unspecified: Secondary | ICD-10-CM | POA: Diagnosis not present

## 2022-01-18 LAB — BASIC METABOLIC PANEL
Anion gap: 8 (ref 5–15)
BUN: 9 mg/dL (ref 6–20)
CO2: 24 mmol/L (ref 22–32)
Calcium: 8.3 mg/dL — ABNORMAL LOW (ref 8.9–10.3)
Chloride: 105 mmol/L (ref 98–111)
Creatinine, Ser: 1.04 mg/dL (ref 0.61–1.24)
GFR, Estimated: >60 mL/min (ref >60)
Glucose, Bld: 97 mg/dL (ref 70–99)
Potassium: 3.7 mmol/L (ref 3.5–5.1)
Sodium: 137 mmol/L (ref 135–145)

## 2022-01-18 LAB — CBC
HCT: 38.5 % — ABNORMAL LOW (ref 39.0–52.0)
Hemoglobin: 13 g/dL (ref 13.0–17.0)
MCH: 31 pg (ref 26.0–34.0)
MCHC: 33.8 g/dL (ref 30.0–36.0)
MCV: 91.7 fL (ref 80.0–100.0)
Platelets: 178 10*3/uL (ref 150–400)
RBC: 4.2 MIL/uL — ABNORMAL LOW (ref 4.22–5.81)
RDW: 13.3 % (ref 11.5–15.5)
WBC: 10.2 10*3/uL (ref 4.0–10.5)
nRBC: 0 % (ref 0.0–0.2)

## 2022-01-18 MED ORDER — METOPROLOL SUCCINATE ER 25 MG PO TB24
25.0000 mg | ORAL_TABLET | Freq: Every day | ORAL | 3 refills | Status: DC
  Filled 2022-01-18 – 2022-04-13 (×3): qty 90, 90d supply, fill #0

## 2022-01-18 MED ORDER — NITROGLYCERIN 0.4 MG SL SUBL: SUBLINGUAL_TABLET | SUBLINGUAL | 1 refills | Status: DC

## 2022-01-18 MED ORDER — TICAGRELOR 90 MG PO TABS
90.0000 mg | ORAL_TABLET | Freq: Two times a day (BID) | ORAL | 2 refills | Status: DC
  Filled 2022-01-18 – 2022-04-13 (×4): qty 180, 90d supply, fill #0

## 2022-01-18 NOTE — Progress Notes (Signed)
? ?Progress Note ? ?Patient Name: James Huynh ?Date of Encounter: 01/18/2022 ? ?Primary Cardiologist:   Rollene Rotunda, MD ? ? ?Subjective  ? ?No chest pain.  No SOB.  ? ?Inpatient Medications  ?  ?Scheduled Meds: ? aspirin  81 mg Oral Daily  ? ezetimibe  10 mg Oral Daily  ? levothyroxine  150 mcg Oral Daily  ? metoprolol succinate  25 mg Oral QHS  ? pantoprazole  40 mg Oral Daily  ? rosuvastatin  40 mg Oral Daily  ? sodium chloride flush  3 mL Intravenous Q12H  ? sodium chloride flush  3 mL Intravenous Q12H  ? ticagrelor  90 mg Oral BID  ? ?Continuous Infusions: ? sodium chloride    ? ?PRN Meds: ?sodium chloride, acetaminophen, ipratropium, nitroGLYCERIN, ondansetron (ZOFRAN) IV, sodium chloride flush  ? ?Vital Signs  ?  ?Vitals:  ? 01/17/22 1200 01/17/22 2013 01/18/22 0057 01/18/22 0449  ?BP:  (!) 100/55 (!) 92/59 111/77  ?Pulse: (!) 58 64 (!) 7 63  ?Resp: 17 18 18 18   ?Temp:  97.8 ?F (36.6 ?C) 99.2 ?F (37.3 ?C) 98.7 ?F (37.1 ?C)  ?TempSrc:  Oral Oral Oral  ?SpO2: 98% 97% 96% 98%  ?Weight:      ?Height:      ? ? ?Intake/Output Summary (Last 24 hours) at 01/18/2022 0958 ?Last data filed at 01/18/2022 0450 ?Gross per 24 hour  ?Intake 771.25 ml  ?Output 1015 ml  ?Net -243.75 ml  ? ?Filed Weights  ? 01/15/22 1357 01/16/22 0150 01/17/22 0522  ?Weight: 108.4 kg 107.6 kg 108.3 kg  ? ? ?Telemetry  ?  ?NSR - Personally Reviewed ? ?ECG  ?  ?NA - Personally Reviewed ? ?Physical Exam  ? ?GEN: No acute distress.   ?Neck: No  JVD ?Cardiac: RRR, no murmurs, rubs, or gallops.  ?Respiratory: Clear  to auscultation bilaterally. ?GI: Soft, nontender, non-distended  ?MS: No  edema; No deformity.  Right radial without bruising or bleeding.  ?Neuro:  Nonfocal  ?Psych: Normal affect  ? ?Labs  ?  ?Chemistry ?Recent Labs  ?Lab 01/16/22 ?0413 01/17/22 ?0323 01/18/22 ?0359  ?NA 139 138 137  ?K 3.6 3.6 3.7  ?CL 104 107 105  ?CO2 26 24 24   ?GLUCOSE 99 105* 97  ?BUN 10 8 9   ?CREATININE 1.15 1.11 1.04  ?CALCIUM 8.7* 8.4* 8.3*  ?GFRNONAA >60  >60 >60  ?ANIONGAP 9 7 8   ?  ? ?Hematology ?Recent Labs  ?Lab 01/16/22 ?0413 01/17/22 ?0323 01/18/22 ?0359  ?WBC 12.7* 9.9 10.2  ?RBC 4.35 4.13* 4.20*  ?HGB 13.7 12.9* 13.0  ?HCT 40.2 37.9* 38.5*  ?MCV 92.4 91.8 91.7  ?MCH 31.5 31.2 31.0  ?MCHC 34.1 34.0 33.8  ?RDW 13.5 13.3 13.3  ?PLT 172 155 178  ? ? ?Cardiac EnzymesNo results for input(s): TROPONINI in the last 168 hours. No results for input(s): TROPIPOC in the last 168 hours.  ? ?BNPNo results for input(s): BNP, PROBNP in the last 168 hours.  ? ?DDimer No results for input(s): DDIMER in the last 168 hours.  ? ?Radiology  ?  ?CARDIAC CATHETERIZATION ? ?Result Date: 01/17/2022 ?  Mid Cx lesion is 60% stenosed.   Prox LAD lesion is 30% stenosed.   Prox RCA lesion is 99% stenosed.   Prox RCA to Mid RCA lesion is 20% stenosed.   A drug-eluting stent was successfully placed using a STENT ONYX FRONTIER 4.0X22.   Post intervention, there is a 0% residual stenosis. 1.  Patent  LAD and left circumflex stents with mild in-stent restenosis in the LAD and moderate restenosis in the left circumflex.  New 99% stenosis in the proximal LAD seems to be the culprit for an myocardial infarction with evidence of plaque rupture and large thrombus.  Significant ectasia in the proximal and mid right coronary artery. 2.  Left ventricular angiography was not performed.  EF was normal by echo.  Mildly elevated left ventricular end-diastolic pressure. 3.  Successful aspiration thrombectomy, balloon angioplasty and drug-eluting stent placement to the right coronary artery. Recommendations: Dual antiplatelet therapy for 12 months. Aggressive treatment of risk factors. I discussed with him the importance of smoking cessation. The left circumflex in-stent restenosis can likely be managed medically.  However, if the patient has refractory angina, FFR based evaluation can be considered. The patient had some chest discomfort with balloon inflation done after the procedure with no EKG changes.   Recommend observation overnight and likely discharge home tomorrow if no issues.   ? ?Cardiac Studies  ? ?CATH ? ?LHC on 01/17/22: ?  ? ? ?Echo from 01/16/22: ?  ? 1. Left ventricular ejection fraction, by estimation, is 60 to 65%. The  ?left ventricle has normal function. The left ventricle has no regional  ?wall motion abnormalities. Left ventricular diastolic parameters were  ?normal.  ? 2. Right ventricular systolic function is normal. The right ventricular  ?size is normal.  ? 3. Left atrial size was mildly dilated.  ? 4. The pericardial effusion is circumferential.  ? 5. The mitral valve is normal in structure. No evidence of mitral valve  ?regurgitation. No evidence of mitral stenosis.  ? 6. The aortic valve is normal in structure. Aortic valve regurgitation is  ?not visualized. No aortic stenosis is present.  ? 7. The inferior vena cava is normal in size with greater than 50%  ?respiratory variability, suggesting right atrial pressure of 3 mmHg.  ? ?Patient Profile  ?   ?61 y.o. male with DES to LCX and LAD in 2016, asthma/COPD, dyslipidemia, GERD, obesity, who was seen by Dr Wyline Mood at [place] for chest pain and SOB on 01/15/22, Trop 279 , EKG with SR with chronic anterior TWIs, he was started on heparin gtt and transferred to [facility] for left cardiac cath. Cath is cancelled on 01/16/22 due to patient refusing NPO, cath is re-planned on 01/17/22.  ? ? ?Assessment & Plan  ?  ?CAD:  Status post PCI as above. Discussed risk reduction.  OK to discharge.  Meds as above.  ? ?Dyslipidemia:  Continue current meds. ? ? ? ?For questions or updates, please contact [facility] ?Please consult [email or web address] for contact info under Cardiology/STEMI. ?  ?Signed, ?Rollene Rotunda, MD  ?01/18/2022, 9:58 AM   ? ?

## 2022-01-18 NOTE — Progress Notes (Signed)
CARDIAC REHAB PHASE I  ? ?Stent education completed with pt and spouse. Pt educated on importance of ASA, Brilinta, statin, and NTG. Pt given heart healthy diet. Reviewed site care, restrictions, and exercise guidelines. Pt states multiple limitations with mobility due to back pain and emphysema. Encouraged small purposeful walks to increase stamina. Will refer to CRP II [place]. ? ?Pt requesting TOC meds. ? ?[phone] ?Reynold Bowen, RN BSN ?01/18/2022 ?10:17 AM ? ?

## 2022-01-18 NOTE — Discharge Summary (Signed)
?Discharge Summary  ?  ?Patient ID: James Huynh ?MRN: [id number]; DOB: 1961/08/21 ? ?Admit date: 01/15/2022 ?Discharge date: 01/18/2022 ? ?PCP:  Donetta Potts, MD ?  ?[facility] Providers ?Cardiologist:  Rollene Rotunda, MD    ? ? ?Discharge Diagnoses  ?  ?Principal Problem: ?  NSTEMI (non-ST elevated myocardial infarction) (HCC) ?Active Problems: ?  High cholesterol ?  Tobacco abuse ?  Dyslipidemia ?  Coronary artery disease involving native coronary artery of native heart without angina pectoris ? ? ? ?Diagnostic Studies/Procedures  ?  ?LHC on 01/17/22: ?   Mid Cx lesion is 60% stenosed. ?  Prox LAD lesion is 30% stenosed. ?  Prox RCA lesion is 99% stenosed. ?  Prox RCA to Mid RCA lesion is 20% stenosed. ?  A drug-eluting stent was successfully placed using a STENT ONYX FRONTIER 4.0X22. ?  Post intervention, there is a 0% residual stenosis. ?  ?1.  Patent LAD and left circumflex stents with mild in-stent restenosis in the LAD and moderate restenosis in the left circumflex.  New 99% stenosis in the proximal LAD seems to be the culprit for an myocardial infarction with evidence of plaque rupture and large thrombus.  Significant ectasia in the proximal and mid right coronary artery. ?2.  Left ventricular angiography was not performed.  EF was normal by echo.  Mildly elevated left ventricular end-diastolic pressure. ?3.  Successful aspiration thrombectomy, balloon angioplasty and drug-eluting stent placement to the right coronary artery. ?  ?Recommendations: ?Dual antiplatelet therapy for 12 months. ?Aggressive treatment of risk factors. ?I discussed with him the importance of smoking cessation. ?The left circumflex in-stent restenosis can likely be managed medically.  However, if the patient has refractory angina, FFR based evaluation can be considered. ?The patient had some chest discomfort with balloon inflation done after the procedure with no EKG changes.  Recommend observation overnight and likely  discharge home tomorrow if no issues. ?  ?  ?Echo from 01/16/22: ?  ? 1. Left ventricular ejection fraction, by estimation, is 60 to 65%. The  ?left ventricle has normal function. The left ventricle has no regional  ?wall motion abnormalities. Left ventricular diastolic parameters were  ?normal.  ? 2. Right ventricular systolic function is normal. The right ventricular  ?size is normal.  ? 3. Left atrial size was mildly dilated.  ? 4. The pericardial effusion is circumferential.  ? 5. The mitral valve is normal in structure. No evidence of mitral valve  ?regurgitation. No evidence of mitral stenosis.  ? 6. The aortic valve is normal in structure. Aortic valve regurgitation is  ?not visualized. No aortic stenosis is present.  ? 7. The inferior vena cava is normal in size with greater than 50%  ?respiratory variability, suggesting right atrial pressure of 3 mmHg.  ?_____________ ?  ?History of Present Illness   ?  ?James Huynh is a 61 y.o. male with a history of CAD with DES to both LCX and LAD in 2016. Patient was seen by Dr. Dina Rich 01/15/2022 at [facility] for the evaluation of chest pain. ? ?Patient presented to the [facility] Emergency Department on 01/15/22 complaining of chest pain. Per chart review, chest pain began the day prior at 9 PM while patient was at rest. Described as a warmth in the upper chest that extended into the throat. Associated with a squeezing feeling in the throat, some SOB. Lasted about 30-40 minutes. Was able to sleep that night without issues. However, had a  recurrent episode on 4/3 while walking at the store and presented to the ED. Initial troponin in the ED 279. [facility] cardiac catheter[facility].  ? ?Catheterization was scheduled for 4/4, however patient refused to be NPO and was rescheduled for 4/5.  ? ?Hospital Course  ?   ?Consultants: None  ? ?CAD  ?NSTEMI ?- S/p Left Heart Cath and PCI as above  ?- Recommend DAPT  with ASA, Brilinta for at least 12 months  ?- Continue Zetia 10 mg daily, crestor 40 mg daily  ?- Continue metoprolol succinate 25 mg daily  ? ?HLD  ?- LDL 33, HLD 30, triglycerides 134 on 01/16/22  ?- Continue crestor, Zetia  ? ?Tobacco Use  ?- Patient was counseled on tobacco cessation this admission  ? ? ?Patient was seen and evaluated by Dr. Antoine PocheHochrein and was deemed stable for discharge.  ? ?Did the patient have an acute coronary syndrome (MI, NSTEMI, STEMI, etc) this admission?:  Yes                              ? ?AHA/ACC Clinical Performance & Quality Measures: ?Aspirin prescribed? - Yes ?ADP Receptor Inhibitor (Plavix/Clopidogrel, Brilinta/Ticagrelor or Effient/Prasugrel) prescribed (includes medically managed patients)? - Yes ?Beta Blocker prescribed? - Yes ?High Intensity Statin (Lipitor 40-80mg  or Crestor 20-40mg ) prescribed? - Yes ?EF assessed during THIS hospitalization? - Yes ?For EF <40%, was ACEI/ARB prescribed? - Not Applicable (EF >/= 40%) ?For EF <40%, Aldosterone Antagonist (Spironolactone or Eplerenone) prescribed? - Not Applicable (EF >/= 40%) ?Cardiac Rehab Phase II ordered (including medically managed patients)? - Yes  ? ?   ? ?The patient will be scheduled for a TOC follow up appointment in 14-21 days.  [facility] where the patient should be seen for follow up.  ?_____________ ? ?Discharge Vitals ?Blood pressure 111/77, pulse 63, temperature 98.7 ?F (37.1 ?C), temperature source Oral, resp. rate 18, height 5\' 10"  (1.778 m), weight 108.3 kg, SpO2 98 %.  ?Filed Weights  ? 01/15/22 1357 01/16/22 0150 01/17/22 0522  ?Weight: 108.4 kg 107.6 kg 108.3 kg  ? ? ?Labs & Radiologic Studies  ?  ?CBC ?Recent Labs  ?  01/17/22 ?0323 01/18/22 ?0359  ?WBC 9.9 10.2  ?HGB 12.9* 13.0  ?HCT 37.9* 38.5*  ?MCV 91.8 91.7  ?PLT 155 178  ? ?Basic Metabolic Panel ?Recent Labs  ?  01/17/22 ?0323 01/18/22 ?0359  ?NA 138 137  ?K 3.6 3.7  ?CL 107 105  ?CO2 24 24   ?GLUCOSE 105* 97  ?BUN 8 9  ?CREATININE 1.11 1.04  ?CALCIUM 8.4* 8.3*  ? ?Liver Function Tests ?No results for input(s): AST, ALT, ALKPHOS, BILITOT, PROT, ALBUMIN in the last 72 hours. ?No results for input(s): LIPASE, AMYLASE in the last 72 hours. ?High Sensitivity Troponin:   ?Recent Labs  ?Lab 01/15/22 ?1359 01/15/22 ?1536  ?TROPONINIHS 279* 282*  ?  ?BNP ?Invalid input(s): POCBNP ?D-Dimer ?No results for input(s): DDIMER in the last 72 hours. ?Hemoglobin A1C ?Recent Labs  ?  01/16/22 ?0413  ?HGBA1C 6.1*  ? ?Fasting Lipid Panel ?Recent Labs  ?  01/16/22 ?0413  ?CHOL 90  ?HDL 30*  ?LDLCALC 33  ?TRIG 134  ?CHOLHDL 3.0  ? ?Thyroid Function Tests ?No results for input(s): TSH, T4TOTAL, T3FREE, THYROIDAB in the last 72 hours. ? ?Invalid input(s): FREET3 ?_____________  ?DG Chest 2 View ? ?  Result Date: 01/15/2022 ?CLINICAL DATA:  A 61 year old male. EXAM: CHEST - 2 VIEW COMPARISON:  Comparison made with December 20, 2020. FINDINGS: Trachea midline. Cardiomediastinal contours and hilar structures are normal. No lobar consolidation. No visible pneumothorax. No sign of pleural effusion. Slight added density along the LEFT heart border may been present previously, suspect this represents prominence of epicardial fat. Mild lingular scarring is also present based on previous CT of the chest. On limited assessment there is no acute skeletal process. EKG leads project over the chest. IMPRESSION: No active cardiopulmonary disease. Electronically Signed   By: Donzetta Kohut M.D.   On: 01/15/2022 14:34  ? ?CARDIAC CATHETERIZATION ? ?Result Date: 01/17/2022 ?  Mid Cx lesion is 60% stenosed.   Prox LAD lesion is 30% stenosed.   Prox RCA lesion is 99% stenosed.   Prox RCA to Mid RCA lesion is 20% stenosed.   A drug-eluting stent was successfully placed using a STENT ONYX FRONTIER 4.0X22.   Post intervention, there is a 0% residual stenosis. 1.  Patent LAD and left circumflex stents with mild in-stent restenosis in the LAD and moderate  restenosis in the left circumflex.  New 99% stenosis in the proximal LAD seems to be the culprit for an myocardial infarction with evidence of plaque rupture and large thrombus.  Significant ectasia in the proximal

## 2022-01-19 MED FILL — Tirofiban HCl in NaCl 0.9% IV Soln 5 MG/100ML (Base Equiv): INTRAVENOUS | Qty: 100 | Status: AC

## 2022-01-24 ENCOUNTER — Other Ambulatory Visit (HOSPITAL_COMMUNITY): Payer: Self-pay

## 2022-01-24 ENCOUNTER — Telehealth (HOSPITAL_COMMUNITY): Payer: Self-pay

## 2022-01-25 ENCOUNTER — Other Ambulatory Visit (HOSPITAL_COMMUNITY): Payer: Self-pay

## 2022-01-25 NOTE — Telephone Encounter (Signed)
Transitions of Care Pharmacy  ? ?Call attempted for a pharmacy transitions of care follow-up. HIPAA appropriate voicemail was left with other resident in the home. Patient was not at home, told that I would call back in a few days. ? ?Call attempt #1. Will follow-up in 2-3 days.  ? ?Darcus Austin, PharmD ?Clinical Pharmacist ?[facility] ?01/25/2022 10:52 AM ? ?  ?

## 2022-01-26 ENCOUNTER — Other Ambulatory Visit (HOSPITAL_COMMUNITY): Payer: Self-pay

## 2022-01-26 ENCOUNTER — Telehealth (HOSPITAL_COMMUNITY): Payer: Self-pay

## 2022-01-26 NOTE — Telephone Encounter (Signed)
Transitions of Care Pharmacy  ? ?Call attempted for a pharmacy transitions of care follow-up. HIPAA appropriate voicemail was left with call back information provided.  ? ?Call attempt #2. Will follow-up in 2-3 days.  ?  ? ?Jiles Crocker, PharmD ?Clinical Pharmacist ?[facility] ?01/26/2022 10:26 AM ? ?

## 2022-01-29 ENCOUNTER — Other Ambulatory Visit (HOSPITAL_BASED_OUTPATIENT_CLINIC_OR_DEPARTMENT_OTHER): Payer: Self-pay

## 2022-01-29 ENCOUNTER — Telehealth (HOSPITAL_BASED_OUTPATIENT_CLINIC_OR_DEPARTMENT_OTHER): Payer: Self-pay

## 2022-01-29 ENCOUNTER — Other Ambulatory Visit (HOSPITAL_COMMUNITY): Payer: Self-pay

## 2022-01-29 NOTE — Telephone Encounter (Signed)
Pharmacy Transitions of Care Follow-up Telephone Call ? ?Date of discharge: 01/18/22  ?Discharge Diagnosis: NSTEMI ? ?How have you been since you were released from the hospital? Patient has been doing well since leaving the hospital. States that he has taken Brilinta before, so he has no questions or concerns about any of his medications, as well as no side effects so far. Patient wants to use [facility] mail order service, address and CC information were confirmed.  ? ?Medication changes made at discharge: ?START taking these medications ? ?START taking these medications  ?Brilinta 90 MG Tabs tablet ?Generic drug: ticagrelor ?Take 1 tablet (90 mg total) by mouth 2 (two) times daily.  ?metoprolol succinate 25 MG 24 hr tablet ?Commonly known as: TOPROL-XL ?Take 1 tablet (25 mg total) by mouth at bedtime.  ? ?CONTINUE taking these medications ? ?CONTINUE taking these medications  ?* albuterol (2.5 MG/3ML) 0.083% nebulizer solution ?Commonly known as: PROVENTIL  ?* albuterol 108 (90 Base) MCG/ACT inhaler ?Commonly known as: VENTOLIN HFA  ?aspirin 81 MG tablet  ?Breztri Aerosphere 160-9-4.8 MCG/ACT Aero ?Generic drug: Budeson-Glycopyrrol-Formoterol ?Inhale 2 puffs into the lungs 2 (two) times daily.  ?ezetimibe 10 MG tablet ?Commonly known as: ZETIA ?Take 1 tablet by mouth once daily  ?furosemide 20 MG tablet ?Commonly known as: LASIX ?Take 1 tablet (20 mg total) by mouth daily.  ?levothyroxine 150 MCG tablet ?Commonly known as: SYNTHROID  ?omeprazole 40 MG capsule ?Commonly known as: PRILOSEC ?Take 1 capsule (40 mg total) by mouth daily.  ?rosuvastatin 40 MG tablet ?Commonly known as: CRESTOR ?Take 1 tablet (40 mg total) by mouth daily.  ?VITAMIN D3 PO  ? very important  * This list has 2 medication(s) that are the same as other medications prescribed for you. Read the directions carefully, and ask your doctor or other care provider to review them with you.  ? ? ?STOP taking these medications ? ?STOP taking these medications   ?aspirin-acetaminophen-caffeine 250-250-65 MG tablet ?Commonly known as: EXCEDRIN MIGRAINE  ?isosorbide mononitrate 30 MG 24 hr tablet ?Commonly known as: IMDUR  ?metoprolol tartrate 25 MG tablet ?Commonly known as: LOPRESSOR  ? ? ?Medication changes verified by the patient? Yes ?  ? ?Medication Accessibility: ? ?Home Pharmacy: [facility] [phone]  ? ?Was the patient provided with refills on discharged medications? yes  ? ?Have all prescriptions been transferred from [facility] to home pharmacy? yes  ? ?Is the patient able to afford medications? medicaid ?Notable copays: $4 ?Eligible patient assistance: no ?  ? ?Medication Review: ?TICAGRELOR (BRILINTA) ?Ticagrelor 90 mg BID initiated on 01/18/22.  ?- Educated patient on expected duration of therapy of aspirin 81mg  with ticagrelor. Advised patient that dose of ticagrelor will be reduced after 12 months. Aspirin will be continued indefinitely. ?- Discussed importance of taking medication around the same time every day, ?- Reviewed potential DDIs with patient ?- Advised patient of medications to avoid (NSAIDs, aspirin maintenance doses>100 mg daily) ?- Educated that Tylenol (acetaminophen) will be the preferred analgesic to prevent risk of bleeding  ?- Emphasized importance of monitoring for signs and symptoms of bleeding (abnormal bruising, prolonged bleeding, nose bleeds, bleeding from gums, discolored urine, black tarry stools)  ?- Educated patient to notify doctor if shortness of breath or abnormal heartbeat occur ?- Advised patient to alert all providers of antiplatelet therapy prior to starting a new medication or having a procedure  ? ?Follow-up Appointments: ? Date Visit Type Length Department  ? 02/15/2022  9:00 AM OFFICE VISIT 45 min [facility] [  [phone]]  ?Patient Instructions:   ?Please arrive 15 minutes prior to your appointment. This will allow [place] to verify and update your medical record and ensure a full appointment for you within the time  allotted.  ?      ? 02/19/2022  9:00 AM OFFICE VISIT 15 min [facility] [[phone]]  ? ? ?If their condition worsens, is the pt aware to call PCP or go to the Emergency Dept.? yes ? ?Final Patient Assessment: ?Patient has been doing well since leaving the hospital. States that he has taken Brilinta before, so he has no questions or concerns about any of his medications, as well as no side effects so far. Patient wants to use [facility] mail order service, address and CC information were confirmed.  ? ?Jiles Crocker, PharmD ?Clinical Pharmacist ?[facility] ?01/29/2022 9:15 AM ? ?

## 2022-02-15 ENCOUNTER — Ambulatory Visit: Payer: Medicaid Other | Admitting: Nurse Practitioner

## 2022-02-15 NOTE — Progress Notes (Deleted)
Office Visit    Patient Name: James Huynh Date of Encounter: 02/15/2022  Primary Care Provider:  Austin Nation, MD Primary Cardiologist:  Minus Breeding, MD  Chief Complaint    61 year old male with a history of CAD, hyperlipidemia, COPD, GERD, hypothyroidism, and tobacco use who presents for post-hospital follow-up related to CAD s/p NSTEMI.  Past Medical History    Past Medical History:  Diagnosis Date   Asthma    CAD (coronary artery disease)    cath 09/09/2015 20% prox RCA, 80% prox to mid LCx treated with DES (3.518 mm Xience Alpine DES), 80% mid LAD lesion treated with 3.518 mm Xience Alpine DES stent postdilated to 4.2.   Chest pain    a. negative stress echo August 2011   GERD (gastroesophageal reflux disease)    High cholesterol    History of pneumonia    a. 2014.   Hypothyroidism    Morbid obesity ([place])    Tobacco abuse    Past Surgical History:  Procedure Laterality Date   CARDIAC CATHETERIZATION N/A 09/09/2015   Procedure: Left Heart Cath and Coronary Angiography;  Surgeon: Troy Sine, MD;  Location: [facility];  Service: Cardiovascular;  Laterality: N/A;   CARDIAC CATHETERIZATION  09/09/2015   Procedure: Coronary Stent Intervention;  Surgeon: Troy Sine, MD;  Location: [facility];  Service: Cardiovascular;;   CORONARY STENT INTERVENTION N/A 01/17/2022   Procedure: CORONARY STENT INTERVENTION;  Surgeon: Wellington Hampshire, MD;  Location: [facility];  Service: Cardiovascular;  Laterality: N/A;   LEFT HEART CATH AND CORONARY ANGIOGRAPHY N/A 01/17/2022   Procedure: LEFT HEART CATH AND CORONARY ANGIOGRAPHY;  Surgeon: Wellington Hampshire, MD;  Location: [facility];  Service: Cardiovascular;  Laterality: N/A;   lymph gland removal     MANDIBLE SURGERY     broken jaw    Allergies  Allergies  Allergen Reactions   Penicillins Anaphylaxis    History of Present Illness    61 year old male with the above past  medical history including CAD, hyperlipidemia, COPD, GERD, hypothyroidism, and tobacco use.   History of negative Myoview in 2011.  Cardiac catheterization in 2016 showed pRCA 20%, p-mLCx 80%-0% s/p DES, and mLAD 80-0% s/p DES.  Echocardiogram at the time showed EF 55 to 60%, mild LVH.  Follow-up echocardiogram in June 2022 in the setting of shortness of breath showed EF 55%, no significant valvular abnormalities.  He was last seen in the office on 12/13/2021 and reported ongoing chest discomfort, dyspnea on exertion, managed with Imdur.  His COPD was thought to be contributing to his shortness of breath.  Smoking cessation was advised, as well as aggressive risk reduction.  He called our office on 01/15/2022 with reports of chest discomfort the night before which she described as a squeezing in his throat with associated shortness of breath.  He was advised to go to the ED.  He presented to the [facility] ED on 01/15/2022. Troponin was elevated, EKG showed chronic anterior TWI. Cardiology was consulted, and he was transferred to [facility] for ongoing management.  He was hospitalized from 01/15/2022-01/18/2022 in the setting of NSTEMI.  Echocardiogram showed EF 60 to 65%, no RWMA, no significant valvular abnormalities.  Cardiac catheterization on 01/17/2022 showed mLCX 60% (ISR), pLAD 30% (ISR), pRCA99-0% s/p aspiration thrombectomy, DES, and p-mRCA 20%.  Aggressive risk factor modification with 12 months DAPT with ASA and Brilinta were recommended.  He was discharged home  in stable condition on 01/18/2022.  He presents today for follow-up.  Since his hospitalization  CAD: S/p DES-LCx and LAD in 2016. Most recent cath on 01/17/2022 showed mLCX 60% (ISR), pLAD 30% (ISR), pRCA99-0% s/p aspiration thrombectomy, DES, and p-mRCA 20%. Stable with no anginal symptoms. No indication for ischemic evaluation.   Hyperlipidemia: LDL was 33 in April 2023. COPD:  Tobacco use: Full cessation advised.  Disposition: Follow-up in    Home Medications    Current Outpatient Medications  Medication Sig Dispense Refill   albuterol (PROVENTIL) (2.5 MG/3ML) 0.083% nebulizer solution Take 2.5 mg by nebulization every 6 (six) hours as needed for wheezing or shortness of breath.     albuterol (VENTOLIN HFA) 108 (90 Base) MCG/ACT inhaler Inhale 2 puffs into the lungs every 6 (six) hours as needed for wheezing or shortness of breath.     aspirin 81 MG tablet Take 81 mg by mouth every evening.     Budeson-Glycopyrrol-Formoterol (BREZTRI AEROSPHERE) 160-9-4.8 MCG/ACT AERO Inhale 2 puffs into the lungs 2 (two) times daily. 10.7 g 11   Cholecalciferol (VITAMIN D3 PO) Take 1 tablet by mouth daily.     ezetimibe (ZETIA) 10 MG tablet Take 1 tablet by mouth once daily 30 tablet 6   furosemide (LASIX) 20 MG tablet Take 1 tablet (20 mg total) by mouth daily. 90 tablet 1   levothyroxine (SYNTHROID) 150 MCG tablet Take 150 mcg by mouth daily.     metoprolol succinate (TOPROL-XL) 25 MG 24 hr tablet Take 1 tablet (25 mg total) by mouth at bedtime. 90 tablet 3   nitroGLYCERIN (NITROSTAT) 0.4 MG SL tablet PLACE 1 TABLET UNDER THE TONGUE EVERY 5 MIN UP TO 3 TIMES AS NEEDED FOR CHEST PAIN 25 tablet 1   omeprazole (PRILOSEC) 40 MG capsule Take 1 capsule (40 mg total) by mouth daily. 30 capsule 11   rosuvastatin (CRESTOR) 40 MG tablet Take 1 tablet (40 mg total) by mouth daily. 90 tablet 3   ticagrelor (BRILINTA) 90 MG TABS tablet Take 1 tablet (90 mg total) by mouth 2 (two) times daily. 180 tablet 2   No current facility-administered medications for this visit.     Review of Systems    ***.  All other systems reviewed and are otherwise negative except as noted above. {The patient has an active order for outpatient cardiac rehabilitation.   Please indicate if the patient is ready to start. Do NOT delete this.  It will auto delete.  Refresh note, then sign.              Click here to document readiness and see contraindications.  :1}  Cardiac  Rehabilitation Eligibility Assessment      Physical Exam    VS:  There were no vitals taken for this visit. , BMI There is no height or weight on file to calculate BMI.     GEN: Well nourished, well developed, in no acute distress. HEENT: normal. Neck: Supple, no JVD, carotid bruits, or masses. Cardiac: RRR, no murmurs, rubs, or gallops. No clubbing, cyanosis, edema.  Radials/DP/PT 2+ and equal bilaterally.  Respiratory:  Respirations regular and unlabored, clear to auscultation bilaterally. GI: Soft, nontender, nondistended, BS + x 4. MS: no deformity or atrophy. Skin: warm and dry, no rash. Neuro:  Strength and sensation are intact. Psych: Normal affect.  Accessory Clinical Findings    ECG personally reviewed by me today - *** - no acute changes.  Lab Results  Component Value Date   WBC  10.2 01/18/2022   HGB 13.0 01/18/2022   HCT 38.5 (L) 01/18/2022   MCV 91.7 01/18/2022   PLT 178 01/18/2022   Lab Results  Component Value Date   CREATININE 1.04 01/18/2022   BUN 9 01/18/2022   NA 137 01/18/2022   K 3.7 01/18/2022   CL 105 01/18/2022   CO2 24 01/18/2022   Lab Results  Component Value Date   ALT 30 01/18/2013   AST 20 01/18/2013   ALKPHOS 75 01/18/2013   BILITOT 0.3 01/18/2013   Lab Results  Component Value Date   CHOL 90 01/16/2022   HDL 30 (L) 01/16/2022   LDLCALC 33 01/16/2022   TRIG 134 01/16/2022   CHOLHDL 3.0 01/16/2022    Lab Results  Component Value Date   HGBA1C 6.1 (H) 01/16/2022    Assessment & Plan    1.  ***   Lenna Sciara, NP 02/15/2022, 7:58 AM

## 2022-02-19 ENCOUNTER — Encounter: Payer: Self-pay | Admitting: Internal Medicine

## 2022-02-19 ENCOUNTER — Encounter: Payer: Self-pay | Admitting: Nurse Practitioner

## 2022-02-19 ENCOUNTER — Ambulatory Visit (INDEPENDENT_AMBULATORY_CARE_PROVIDER_SITE_OTHER): Payer: Medicaid Other | Admitting: Internal Medicine

## 2022-02-19 ENCOUNTER — Ambulatory Visit (INDEPENDENT_AMBULATORY_CARE_PROVIDER_SITE_OTHER): Payer: Medicaid Other | Admitting: Nurse Practitioner

## 2022-02-19 VITALS — BP 100/70 | HR 83 | Ht 70.0 in | Wt 248.2 lb

## 2022-02-19 DIAGNOSIS — J449 Chronic obstructive pulmonary disease, unspecified: Secondary | ICD-10-CM

## 2022-02-19 DIAGNOSIS — E785 Hyperlipidemia, unspecified: Secondary | ICD-10-CM

## 2022-02-19 DIAGNOSIS — I2510 Atherosclerotic heart disease of native coronary artery without angina pectoris: Secondary | ICD-10-CM

## 2022-02-19 DIAGNOSIS — Z87891 Personal history of nicotine dependence: Secondary | ICD-10-CM | POA: Insufficient documentation

## 2022-02-19 DIAGNOSIS — Z720 Tobacco use: Secondary | ICD-10-CM

## 2022-02-19 MED ORDER — BREZTRI AEROSPHERE 160-9-4.8 MCG/ACT IN AERO: 2.0000 | INHALATION_SPRAY | Freq: Two times a day (BID) | RESPIRATORY_TRACT | 0 refills | Status: DC

## 2022-02-19 NOTE — Addendum Note (Signed)
Addended by: Carleene Mains D on: 02/19/2022 09:46 AM ? ? Modules accepted: Orders ? ?

## 2022-02-19 NOTE — Assessment & Plan Note (Addendum)
Quit smoking 01/2021 ?-  H/o childhood allergies "to everything" on allergy shots age 61 -25  ?- PFT's 05/08/18  FEV1 2.44 (65 % ) ratio 0.58  p 7 % improvement from saba p ? saba hfa and neb prior to study with DLCO  21.84 (67%) corrects to 3.55 (76%)  for alv volume and FV curve classic mild/mod concavity plus  ERV 35% at wt 231  ?- 02/13/2021  After extensive coaching inhaler device,  effectiveness =    75% (short ti) > try symbicort 160 2bid and if fails > change to breztri  ?- 03/17/2021  After extensive coaching inhaler device,  effectiveness =  75%> try   Breztri  2 bid and if not happy with benefits vs cost then back to stiolto 2 pffs each am  ?-  03/17/2021   Walked RA  approx   300 ft  @ moderate pace  stopped due end of study with sats 98%    ?- Labs ordered 08/14/2021  :  allergy profile  Eos 0.1/ IgE 103   alpha one AT phenotype  MM level 141  ? ?- 08/14/2021   Walked on RA x  3  lap(s) =  approx 478f @ mod to fast pace, stopped due to end of study, mild sob  with lowest 02 sats 95% at lowest   ?- 02/19/2022  After extensive coaching inhaler device,  effectiveness =    90% > continue breztri and more approp saba ? ? Group D (now reclassified as E) in terms of symptom/risk and laba/lama/ICS  therefore appropriate rx at this point >>>  Continue breztri ? ?  ? ?Re SABA :  I spent extra time with pt today reviewing appropriate use of albuterol for prn use on exertion with the following points: ?1) saba is for relief of sob that does not improve by walking a slower pace or resting but rather if the pt does not improve after trying this first. ?2) If the pt is convinced, as many are, that saba helps recover from activity faster then it's easy to tell if this is the case by re-challenging : ie stop, take the inhaler, then p 5 minutes try the exact same activity (intensity of workload) that just caused the symptoms and see if they are substantially diminished or not after saba ?3) if there is an activity that reproducibly  causes the symptoms, try the saba 15 min before the activity on alternate days  ? ?If in fact the saba really does help, then fine to continue to use it prn but advised may need to look closer at the maintenance regimen being used to achieve better control of airways disease with exertion.  ?

## 2022-02-19 NOTE — Patient Instructions (Addendum)
Plan A = Automatic = Always=    Breztri Take 2 puffs first thing in am and then another 2 puffs about 12 hours later.  ?  ? ? ?Plan B = Backup (to supplement plan A, not to replace it) ?Only use your albuterol inhaler as a rescue medication to be used if you can't catch your breath by resting or doing a relaxed purse lip breathing pattern.  ?- The less you use it, the better it will work when you need it. ?- Ok to use the inhaler up to 2 puffs  every 4 hours if you must but call for appointment if use goes up over your usual need ?- Don't leave home without it !!  (think of it like the spare tire for your car)  ? ?Plan C = Crisis (instead of Plan B but only if Plan B stops working) ?- only use your albuterol nebulizer if you first try Plan B and it fails to help > ok to use the nebulizer up to every 4 hours but if start needing it regularly call for immediate appointment ? ?To get the most out of exercise, you need to be continuously aware that you are short of breath, but never out of breath, for at least 30 minutes daily. As you improve, it will actually be easier for you to do the same amount of exercise  in  30 minutes so always push to the level where you are short of breath.    ? ?Make sure you check your oxygen saturations at highest level of activity -  goad is above 90% ? ? ?Please schedule a follow up visit in 12 months but call sooner if needed  ? ? ? ? ? ? ?  ?

## 2022-02-19 NOTE — Progress Notes (Signed)
? ?James Huynh, male    DOB: 03/26/1961   MRN: [id number] ? ? ?Brief patient profile:  ?60  yowm  MM/Quit smoking (and inhaling) cigars  01/2021 p already stopped cigs in 1992 due to  dx of  asthma rx albuterol and more albuterol up to 4 inhalers  per month and much better after a week of prednisone referred to pulmonary clinic in [facility]  02/13/2021 by [name], PA ? ?Born at [facility] with "need for trach before age [age]" but "parents refused" then subsequenly  no problem age 65 then episodic  facial swelling  and difficulty breathing eval by "[facility]"  allergist in [place] > allergic to everything"  took shots until age 55 still problems in spring mostly rhinitis not asthma until 1992.    ? ? ? ?History of Present Illness  ?02/13/2021  Pulmonary/ 1st office eval/ [name] / [name]  ?Chief Complaint  ?Patient presents with  ? Pulmonary Consult  ?  Referred by [name], PA. Pt c/o SOB for the past 2 years worse over the past 2 wks. Pt states that he always feels like he is unable to take a deep enough breath, feels better when he yawns. He is using his albuterol several times per day.   ?Dyspnea: limited by R radicular back pain > sob  ?Cough: sporadic /no rhinitis at initial eval or noct cough  ?Sleep: no problem noct lying flat ?SABA use: way too much  ?rec ?Plan A = Automatic = Always=    Symbicort 160 Take 2 puffs first thing in am and then another 2 puffs about 12 hours later.  ?Work on inhaler technique:    ?Plan B = Backup (to supplement plan A, not to replace it) ?Only use your albuterol inhaler as a rescue medication ?Plan C = Crisis (instead of Plan B but only if Plan B stops working) ?- only use your albuterol nebulizer if you first try Plan B  ?Omeprazole should be 40 mg  Take 30-60 min before first meal of the day  ?GERD diet/ lifestyle rx ?  ?  ? ? 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 Follow-up  ?  No complaints currently  ? Dyspnea:  An aisle or two at [employer] /no 02  ?Cough: some am congestion x 30 min daily  ?Sleeping: flat bed on side  ?SABA use: not using neb now/ hfa 5-6 x per day  ?02: none  ?Covid status: never vaccinated/ had covid Oct 2021  ?Rec ?Plan A = Automatic = Always=   breztri Take 2 puffs first thing in am and then another 2 puffs about 12 hours later.   OR STIOLTO 2 puffs in am  ?Work on inhaler technique:   ?Plan B = Backup (to supplement plan A, not to replace it) ?Only use your albuterol inhaler as a rescue medication ?Plan C = Crisis (instead of Plan B but only if Plan B stops working) ?- only use your albuterol nebulizer if you first try Plan B and it fails to help  ?Ok to try Try albuterol 15 min before an activity (on alternating days use inhaler then nebulizer then nothing)  that you know would make you short of breath  ?Add needs allergy profile and alpha one   ? ? ?08/14/2021  f/u ov/[name]/[name] re: GOLD 2 vs ACOS  maint on breztri 2bid    ?Chief Complaint  ?Patient presents with  ? Follow-up  ?  SOB and cough are about the same since last OV.  ? ?PFT- status needs scheduling.   ?Dyspnea:  room to room gives out bending over/  ?Cough: clear / mornings ok / assoc variable nasal congestion ?Sleeping: bed is flat/ on side one bed  ?SABA use: 1-2 inhalers per inhaler  ?02: no  ?Covid status: never vax/ / infected Oct 2021  ?Lung cancer screening: ordered  ?Rec ?We are referring you for our lung cancer screening program  ?Work on inhaler technique:   ?Ok to try albuterol 15 min before an activity (on alternating days with inhaler then nebulizer then nothing )  that you know would usually make you short of breath ?Labs: ?Allergy profile  Eos 0.1/ IgE 103   alpha one AT phenotype  MM level 141  ? ? 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 Follow-up  ?   Cough has improved but SOB is still present. Patient states he has gained 20 pounds. Had a heart attack in April 2023.   ?Dyspnea:  does fine at [employer]/ slow pace  =  MMRC2 = can't walk a nl pace on a flat grade s sob but does fine slow and flat   ?Cough: improved since quit smoking  ?Sleeping: bed is flat on side/ one pillow  ?SABA use: one inhaler per month/ ?02: none  ?Covid status: never vax / infected Oct 2021  ?Lung cancer screening: due back 11/2022  ? ? ?No obvious day to day or daytime variability or assoc excess/ purulent sputum or mucus plugs or hemoptysis or cp or chest tightness, subjective wheeze or overt sinus or hb symptoms.  ? ?sleeping without nocturnal  or early am exacerbation  of respiratory  c/o's or need for noct saba. Also denies any obvious fluctuation of symptoms with weather or environmental changes or other aggravating or alleviating factors except as outlined above  ? ?No unusual exposure hx or h/o childhood pna/ asthma or knowledge of premature birth. ? ?Current Allergies, Complete Past Medical History, Past Surgical History, Family History, and Social History were reviewed in [facility] electronic medical record. ? ?ROS  The following are not active complaints unless bolded ?Hoarseness, sore throat, dysphagia, dental problems, itching, sneezing,  nasal congestion or discharge of excess mucus or purulent secretions, ear ache,   fever, chills, sweats, unintended wt loss or wt gain, classically pleuritic or exertional cp,  orthopnea pnd or arm/hand swelling  or leg swelling, presyncope, palpitations, abdominal pain, anorexia, nausea, vomiting, diarrhea  or change in bowel habits or change in bladder habits, change in stools or change in urine, dysuria, hematuria,  rash, arthralgias, visual complaints, headache, numbness, weakness or ataxia or problems with walking or coordination,  change in mood or  memory. ?      ? ?Current Meds  ?Medication Sig  ? albuterol (PROVENTIL) (2.5  MG/3ML) 0.083% nebulizer solution Take 2.5 mg by nebulization every 6 (six) hours as needed for wheezing or shortness of breath.  ? albuterol (VENTOLIN HFA) 108 (90 Base) MCG/ACT inhaler Inhale 2 puffs into the lungs every 6 (six) hours as needed for wheezing or shortness of breath.  ? aspirin 81 MG tablet Take 81 mg by mouth every evening.  ? Budeson-Glycopyrrol-Formoterol (BREZTRI AEROSPHERE) 160-9-4.8 MCG/ACT AERO Inhale 2 puffs into the lungs  2 (two) times daily.  ? Cholecalciferol (VITAMIN D3 PO) Take 1 tablet by mouth daily.  ? ezetimibe (ZETIA) 10 MG tablet Take 1 tablet by mouth once daily  ? furosemide (LASIX) 20 MG tablet Take 1 tablet (20 mg total) by mouth daily.  ? levothyroxine (SYNTHROID) 150 MCG tablet Take 150 mcg by mouth daily.  ? metoprolol succinate (TOPROL-XL) 25 MG 24 hr tablet Take 1 tablet (25 mg total) by mouth at bedtime.  ? nitroGLYCERIN (NITROSTAT) 0.4 MG SL tablet PLACE 1 TABLET UNDER THE TONGUE EVERY 5 MIN UP TO 3 TIMES AS NEEDED FOR CHEST PAIN  ? omeprazole (PRILOSEC) 40 MG capsule Take 1 capsule (40 mg total) by mouth daily.  ? rosuvastatin (CRESTOR) 40 MG tablet Take 1 tablet (40 mg total) by mouth daily.  ? ticagrelor (BRILINTA) 90 MG TABS tablet Take 1 tablet (90 mg total) by mouth 2 (two) times daily.  ?     ? ? ?  ? ? ? ? ? ?Past Medical History:  ?Diagnosis Date  ? Asthma   ? CAD (coronary artery disease)   ? cath 09/09/2015 20% prox RCA, 80% prox to mid LCx treated with DES (3.5?18 mm Xience Alpine DES), 80% mid LAD lesion treated with 3.5?18 mm Xience Alpine DES stent postdilated to 4.2.  ? Chest pain   ? a. negative stress echo August 2011  ? GERD (gastroesophageal reflux disease)   ? High cholesterol   ? History of pneumonia   ? a. 2014.  ? Hypothyroidism   ? Morbid obesity ([facility])   ? Tobacco abuse   ? ?  ? ?Objective:  ?  ?02/19/2022         248  ?08/14/2021     230   ?03/17/21 234 lb 9.6 oz (106.4 kg)  ?02/13/21 234 lb (106.1 kg)  ?01/17/21 236 lb 3.2 oz (107.1 kg)  ?   ?Vital signs reviewed  02/19/2022  - Note at rest 02 sats  97% on RA  ? ?General appearance:    pleasant obese wm nad  ? ? ?HEENT : pt wearing mask not removed for exam due to covid - 19 concerns.  ? ? ?NECK :  without

## 2022-02-19 NOTE — Assessment & Plan Note (Signed)
Quit 01/2021  ? ?Reviewed LDSCT > advised yearly f/u  ? ?Discussed in detail all the  indications, usual  risks and alternatives  relative to the benefits with patient who agrees to proceed with  f/u as outlined   ? ? ?    ?  ? ?Each maintenance medication was reviewed in detail including emphasizing most importantly the difference between maintenance and prns and under what circumstances the prns are to be triggered using an action plan format where appropriate. ? ?Total time for H and P, chart review, counseling, reviewing hfa device(s) and generating customized AVS unique to this office visit / same day charting = 31 min  ?     ?

## 2022-02-19 NOTE — Patient Instructions (Signed)
Medication Instructions:  ?Your physician recommends that you continue on your current medications as directed. Please refer to the Current Medication list given to you today. ? ?*If you need a refill on your cardiac medications before your next appointment, please call your pharmacy* ? ? ?Lab Work: ?NONE ordered at this time of appointment  ? ?If you have labs (blood work) drawn today and your tests are completely normal, you will receive your results only by: ?MyChart Message (if you have MyChart) OR ?A paper copy in the mail ?If you have any lab test that is abnormal or we need to change your treatment, we will call you to review the results. ? ? ?Testing/Procedures: ?NONE ordered at this time of appointment  ? ? ? ?Follow-Up: ?At [facility], you and your health needs are our priority.  As part of our continuing mission to provide you with exceptional heart care, we have created designated Provider Care Teams.  These Care Teams include your primary Cardiologist (physician) and Advanced Practice Providers (APPs -  Physician Assistants and Nurse Practitioners) who all work together to provide you with the care you need, when you need it. ? ?We recommend signing up for the patient portal called "MyChart".  Sign up information is provided on this After Visit Summary.  MyChart is used to connect with patients for Virtual Visits (Telemedicine).  Patients are able to view lab/test results, encounter notes, upcoming appointments, etc.  Non-urgent messages can be sent to your provider as well.   ?To learn more about what you can do with MyChart, go to [email or web address].   ? ?Your next appointment:   ?2-3 month(s) ? ?The format for your next appointment:   ?In Person ? ?Provider:   ?Minus Breeding, MD  or Diona Browner, NP      ? ? ?Other Instructions ? ? ?Important Information About Sugar ? ? ? ? ? ? ?

## 2022-02-19 NOTE — Progress Notes (Signed)
? ? ?Office Visit  ?  ?Patient Name: James Huynh ?Date of Encounter: 02/19/2022 ? ?Primary Care Provider:  Lovey Newcomer, PA ?Primary Cardiologist:  Rollene Rotunda, MD ? ?Chief Complaint  ?  ?61 year old male with a history of CAD, hyperlipidemia, COPD, GERD, hypothyroidism, and tobacco use who presents for post-hospital follow-up related to CAD s/p NSTEMI. ? ?Past Medical History  ?  ?Past Medical History:  ?Diagnosis Date  ? Asthma   ? CAD (coronary artery disease)   ? cath 09/09/2015 20% prox RCA, 80% prox to mid LCx treated with DES (3.5?18 mm Xience Alpine DES), 80% mid LAD lesion treated with 3.5?18 mm Xience Alpine DES stent postdilated to 4.2.  ? Chest pain   ? a. negative stress echo August 2011  ? GERD (gastroesophageal reflux disease)   ? High cholesterol   ? History of pneumonia   ? a. 2014.  ? Hypothyroidism   ? Morbid obesity (HCC)   ? Tobacco abuse   ? ?Past Surgical History:  ?Procedure Laterality Date  ? CARDIAC CATHETERIZATION N/A 09/09/2015  ? Procedure: Left Heart Cath and Coronary Angiography;  Surgeon: Lennette Bihari, MD;  Location: [facility];  Service: Cardiovascular;  Laterality: N/A;  ? CARDIAC CATHETERIZATION  09/09/2015  ? Procedure: Coronary Stent Intervention;  Surgeon: Lennette Bihari, MD;  Location: [facility];  Service: Cardiovascular;;  ? CORONARY STENT INTERVENTION N/A 01/17/2022  ? Procedure: CORONARY STENT INTERVENTION;  Surgeon: Iran Ouch, MD;  Location: [facility];  Service: Cardiovascular;  Laterality: N/A;  ? LEFT HEART CATH AND CORONARY ANGIOGRAPHY N/A 01/17/2022  ? Procedure: LEFT HEART CATH AND CORONARY ANGIOGRAPHY;  Surgeon: Iran Ouch, MD;  Location: [facility];  Service: Cardiovascular;  Laterality: N/A;  ? lymph gland removal    ? MANDIBLE SURGERY    ? broken jaw  ? ? ?Allergies ? ?Allergies  ?Allergen Reactions  ? Penicillins Anaphylaxis  ? ? ?History of Present Illness  ?  ?61 year old male with the above past medical  history including CAD, hyperlipidemia, COPD, GERD, hypothyroidism, and tobacco use.  ?  ?History of negative Myoview in 2011. Cardiac catheterization in 2016 showed pRCA 20%, p-mLCx 80%-0% s/p DES, and mLAD 80-0% s/p DES.  Echocardiogram at the time showed EF 55 to 60%, mild LVH.  Follow-up echocardiogram in June 2022 in the setting of shortness of breath showed EF 55%, no significant valvular abnormalities.  He was last seen in the office on 12/13/2021 and reported ongoing chest discomfort, dyspnea on exertion, managed with Imdur. His COPD was thought to be contributing to his shortness of breath. Smoking cessation was advised, as well as aggressive risk reduction.  He called our office on 01/15/2022 with reports of chest discomfort the night before which she described as a squeezing in his throat with associated shortness of breath. He was advised to go to the ED.  He presented to the [facility] ED on 01/15/2022. Troponin was elevated, EKG showed chronic anterior TWI. Cardiology was consulted, and he was transferred to [facility] for ongoing management.  He was hospitalized from 01/15/2022-01/18/2022 in the setting of NSTEMI. Echocardiogram showed EF 60 to 65%, no RWMA, no significant valvular abnormalities. Cardiac catheterization on 01/17/2022 showed mLCX 60% (ISR), pLAD 30% (ISR), pRCA99-0% s/p aspiration thrombectomy, DES, and p-mRCA 20%. Aggressive risk factor modification with 12 months DAPT with ASA and Brilinta were recommended.  He was discharged home in stable condition on 01/18/2022. ?  ?  He presents today for follow-up. Since his hospitalization he has done well from a cardiac standpoint. He denies any symptoms concerning for angina, denies worsening dyspnea.  He has quit smoking.  Overall, he reports feeling well and denies any new concerns today. ?  ?Home Medications  ?  ?Current Outpatient Medications  ?Medication Sig Dispense Refill  ? albuterol (PROVENTIL) (2.5 MG/3ML) 0.083% nebulizer solution Take 2.5 mg by  nebulization every 6 (six) hours as needed for wheezing or shortness of breath.    ? albuterol (VENTOLIN HFA) 108 (90 Base) MCG/ACT inhaler Inhale 2 puffs into the lungs every 6 (six) hours as needed for wheezing or shortness of breath.    ? aspirin 81 MG tablet Take 81 mg by mouth every evening.    ? Budeson-Glycopyrrol-Formoterol (BREZTRI AEROSPHERE) 160-9-4.8 MCG/ACT AERO Inhale 2 puffs into the lungs 2 (two) times daily. 10.7 g 11  ? Budeson-Glycopyrrol-Formoterol (BREZTRI AEROSPHERE) 160-9-4.8 MCG/ACT AERO Inhale 2 puffs into the lungs in the morning and at bedtime. 10.7 g 0  ? Cholecalciferol (VITAMIN D3 PO) Take 1 tablet by mouth daily.    ? ezetimibe (ZETIA) 10 MG tablet Take 1 tablet by mouth once daily 30 tablet 6  ? furosemide (LASIX) 20 MG tablet Take 1 tablet (20 mg total) by mouth daily. 90 tablet 1  ? levothyroxine (SYNTHROID) 150 MCG tablet Take 150 mcg by mouth daily.    ? metoprolol succinate (TOPROL-XL) 25 MG 24 hr tablet Take 1 tablet (25 mg total) by mouth at bedtime. 90 tablet 3  ? nitroGLYCERIN (NITROSTAT) 0.4 MG SL tablet PLACE 1 TABLET UNDER THE TONGUE EVERY 5 MIN UP TO 3 TIMES AS NEEDED FOR CHEST PAIN 25 tablet 1  ? omeprazole (PRILOSEC) 40 MG capsule Take 1 capsule (40 mg total) by mouth daily. 30 capsule 11  ? rosuvastatin (CRESTOR) 40 MG tablet Take 1 tablet (40 mg total) by mouth daily. 90 tablet 3  ? ticagrelor (BRILINTA) 90 MG TABS tablet Take 1 tablet (90 mg total) by mouth 2 (two) times daily. 180 tablet 2  ? ?No current facility-administered medications for this visit.  ?  ? ?Review of Systems  ?  ?He denies chest pain, palpitations, dyspnea, pnd, orthopnea, n, v, dizziness, syncope, edema, weight gain, or early satiety. All other systems reviewed and are otherwise negative except as noted above.  ? ? ?Cardiac Rehabilitation Eligibility Assessment  ?The patient is ready to start cardiac rehabilitation from a cardiac standpoint. ?  ? ?Physical Exam  ?  ?VS:  BP 100/70   Pulse 83    Ht 5\' 10"  (1.778 m)   Wt 248 lb 3.2 oz (112.6 kg)   SpO2 95%   BMI 35.61 kg/m?  ?GEN: Well nourished, well developed, in no acute distress. ?HEENT: normal. ?Neck: Supple, no JVD, carotid bruits, or masses. ?Cardiac: RRR, no murmurs, rubs, or gallops. No clubbing, cyanosis, edema.  Radials/DP/PT 2+ and equal bilaterally.  Radial cath site without bruising, bleeding, or hematoma. ?Respiratory:  Respirations regular and unlabored, clear to auscultation bilaterally. ?GI: Soft, nontender, nondistended, BS + x 4. ?MS: no deformity or atrophy. ?Skin: warm and dry, no rash. ?Neuro:  Strength and sensation are intact. ?Psych: Normal affect. ? ?Accessory Clinical Findings  ?  ?ECG personally reviewed by me today -NSR, 83 bpm- no acute changes. ? ?Lab Results  ?Component Value Date  ? WBC 10.2 01/18/2022  ? HGB 13.0 01/18/2022  ? HCT 38.5 (L) 01/18/2022  ? MCV 91.7 01/18/2022  ? PLT  178 01/18/2022  ? ?Lab Results  ?Component Value Date  ? CREATININE 1.04 01/18/2022  ? BUN 9 01/18/2022  ? NA 137 01/18/2022  ? K 3.7 01/18/2022  ? CL 105 01/18/2022  ? CO2 24 01/18/2022  ? ?Lab Results  ?Component Value Date  ? ALT 30 01/18/2013  ? AST 20 01/18/2013  ? ALKPHOS 75 01/18/2013  ? BILITOT 0.3 01/18/2013  ? ?Lab Results  ?Component Value Date  ? CHOL 90 01/16/2022  ? HDL 30 (L) 01/16/2022  ? LDLCALC 33 01/16/2022  ? TRIG 134 01/16/2022  ? CHOLHDL 3.0 01/16/2022  ?  ?Lab Results  ?Component Value Date  ? HGBA1C 6.1 (H) 01/16/2022  ? ? ?Assessment & Plan  ?  ?1. CAD: S/p DES-LCx and LAD in 2016. S/p NSTEMI 01/15/22. Most recent cath on 01/17/2022 showed mLCX 60% (ISR), pLAD 30% (ISR), pRCA 99-0% s/p aspiration thrombectomy, DES, and p-mRCA 20%.  Echo showed EF 60 to 65%, no RWMA, no significant valvular abnormalities. Stable with no anginal symptoms. His weight is up 10 pounds by our scale over the last month, however, he states his weight at home has been stable-likely in the setting of extra clothing.  He denies worsening dyspnea, PND,  orthopnea. Euvolemic and well compensated on exam. BP is borderline in office today, 100/70,  however, he states this is normal for him.  He is asymptomatic. He may begin cardiac rehab. Continue DAPT with

## 2022-03-01 ENCOUNTER — Other Ambulatory Visit: Payer: Self-pay | Admitting: Internal Medicine

## 2022-03-14 ENCOUNTER — Encounter (HOSPITAL_COMMUNITY): Payer: Self-pay

## 2022-03-14 ENCOUNTER — Encounter (HOSPITAL_COMMUNITY)
Admission: RE | Admit: 2022-03-14 | Discharge: 2022-03-14 | Disposition: A | Discharge status: Home / Self Care | Payer: Medicaid Other | Source: Ambulatory Visit | Attending: Cardiology | Admitting: Cardiology

## 2022-03-14 VITALS — BP 90/70 | HR 72 | Ht 70.0 in | Wt 248.0 lb

## 2022-03-14 DIAGNOSIS — I214 Non-ST elevation (NSTEMI) myocardial infarction: Secondary | ICD-10-CM | POA: Diagnosis present

## 2022-03-14 DIAGNOSIS — Z955 Presence of coronary angioplasty implant and graft: Secondary | ICD-10-CM | POA: Insufficient documentation

## 2022-03-14 NOTE — Progress Notes (Signed)
Cardiac Individual Treatment Plan  Patient Details  Name: James Huynh MRN: [id number] Date of Birth: 04/04/1961 Referring Provider:   Flowsheet Row CARDIAC REHAB PHASE II ORIENTATION from 03/14/2022 in [facility] CARDIAC REHABILITATION  Referring Provider Dr. Antoine Poche       Initial Encounter Date:  Flowsheet Row CARDIAC REHAB PHASE II ORIENTATION from 03/14/2022 in [place] CARDIAC REHABILITATION  Date 03/14/22       Visit Diagnosis: NSTEMI (non-ST elevated myocardial infarction) [facility])  Status post coronary artery stent placement  Patient's Home Medications on Admission:  Current Outpatient Medications:    albuterol (PROVENTIL) (2.5 MG/3ML) 0.083% nebulizer solution, Take 2.5 mg by nebulization every 6 (six) hours as needed for wheezing or shortness of breath., Disp: , Rfl:    albuterol (VENTOLIN HFA) 108 (90 Base) MCG/ACT inhaler, Inhale 2 puffs into the lungs every 6 (six) hours as needed for wheezing or shortness of breath., Disp: , Rfl:    aspirin EC 81 MG tablet, Take 81 mg by mouth every evening., Disp: , Rfl:    Budeson-Glycopyrrol-Formoterol (BREZTRI AEROSPHERE) 160-9-4.8 MCG/ACT AERO, Inhale 2 puffs into the lungs 2 (two) times daily., Disp: 10.7 g, Rfl: 11   Budeson-Glycopyrrol-Formoterol (BREZTRI AEROSPHERE) 160-9-4.8 MCG/ACT AERO, Inhale 2 puffs into the lungs in the morning and at bedtime., Disp: 10.7 g, Rfl: 0   Cholecalciferol (VITAMIN D3 PO), Take 1 tablet by mouth in the morning., Disp: , Rfl:    ezetimibe (ZETIA) 10 MG tablet, Take 1 tablet by mouth once daily (Patient taking differently: Take 10 mg by mouth at bedtime.), Disp: 30 tablet, Rfl: 6   furosemide (LASIX) 20 MG tablet, Take 1 tablet (20 mg total) by mouth daily., Disp: 90 tablet, Rfl: 1   levothyroxine (SYNTHROID) 150 MCG tablet, Take 150 mcg by mouth daily., Disp: , Rfl:    metoprolol succinate (TOPROL-XL) 25 MG 24 hr tablet, Take 1 tablet (25 mg total) by mouth at bedtime., Disp: 90 tablet, Rfl:  3   nitroGLYCERIN (NITROSTAT) 0.4 MG SL tablet, PLACE 1 TABLET UNDER THE TONGUE EVERY 5 MIN UP TO 3 TIMES AS NEEDED FOR CHEST PAIN, Disp: 25 tablet, Rfl: 1   omeprazole (PRILOSEC) 40 MG capsule, Take 1 capsule by mouth once daily, Disp: 30 capsule, Rfl: 2   rosuvastatin (CRESTOR) 40 MG tablet, Take 1 tablet (40 mg total) by mouth daily. (Patient taking differently: Take 40 mg by mouth at bedtime.), Disp: 90 tablet, Rfl: 3   ticagrelor (BRILINTA) 90 MG TABS tablet, Take 1 tablet (90 mg total) by mouth 2 (two) times daily., Disp: 180 tablet, Rfl: 2  Past Medical History: Past Medical History:  Diagnosis Date   Asthma    CAD (coronary artery disease)    cath 09/09/2015 20% prox RCA, 80% prox to mid LCx treated with DES (3.518 mm Xience Alpine DES), 80% mid LAD lesion treated with 3.518 mm Xience Alpine DES stent postdilated to 4.2.   Chest pain    a. negative stress echo August 2011   GERD (gastroesophageal reflux disease)    High cholesterol    History of pneumonia    a. 2014.   Hypothyroidism    Morbid obesity (HCC)    Tobacco abuse     Tobacco Use: Social History   Tobacco Use  Smoking Status Former   Packs/day: 1.00   Years: 32.00   Pack years: 32.00   Types: Cigarettes   Start date: 08/06/1975   Quit date: 01/30/2021   Years since quitting: 1.1  Passive exposure: Current  Smokeless Tobacco Never    Labs: Review Flowsheet        Latest Ref Rng & Units 01/18/2013 09/08/2015 09/09/2015 01/16/2022  Labs for ITP Cardiac and Pulmonary Rehab  Cholestrol 0 - 200 mg/dL   102   90    LDL (calc) 0 - 99 mg/dL   725   33    HDL-C >36 mg/dL   24   30    Trlycerides <150 mg/dL   644   034    Hemoglobin A1c 4.8 - 5.6 %  6.0    6.1    TCO2 0 - 100 mmol/L 19               Capillary Blood Glucose: No results found for: GLUCAP   Exercise Target Goals: Exercise Program Goal: Individual exercise prescription set using results from initial 6 min walk test and THRR while  considering  patient's activity barriers and safety.   Exercise Prescription Goal: Starting with aerobic activity 30 plus minutes a day, 3 days per week for initial exercise prescription. Provide home exercise prescription and guidelines that participant acknowledges understanding prior to discharge.  Activity Barriers & Risk Stratification:  Activity Barriers & Cardiac Risk Stratification - 03/14/22 0824       Activity Barriers & Cardiac Risk Stratification   Activity Barriers Back Problems;Shortness of Breath    Cardiac Risk Stratification High             6 Minute Walk:  6 Minute Walk     Row Name 03/14/22 0913         6 Minute Walk   Distance 1000 feet     Walk Time 6 minutes     # of Rest Breaks 0     MPH 1.89     METS 2.24     RPE 12     VO2 Peak 7.83     Symptoms Yes (comment)     Comments bilateral hip pain 2/10     Resting HR 72 bpm     Resting BP 90/70     Resting Oxygen Saturation  96 %     Exercise Oxygen Saturation  during 6 min walk 97 %     Max Ex. HR 90 bpm     Max Ex. BP 112/76     2 Minute Post BP 102/70              Oxygen Initial Assessment:   Oxygen Re-Evaluation:   Oxygen Discharge (Final Oxygen Re-Evaluation):   Initial Exercise Prescription:  Initial Exercise Prescription - 03/14/22 0900       Date of Initial Exercise RX and Referring Provider   Date 03/14/22    Referring Provider Dr. Antoine Poche    Expected Discharge Date 06/08/22      NuStep   Level 1    SPM 80    Minutes 22      Arm Ergometer   Level 1    RPM 60    Minutes 17      Prescription Details   Frequency (times per week) 3    Duration Progress to 30 minutes of continuous aerobic without signs/symptoms of physical distress      Intensity   THRR 40-80% of Max Heartrate 64-128    Ratings of Perceived Exertion 11-13    Perceived Dyspnea 0-4      Resistance Training   Training Prescription Yes    Weight 4    Reps  10-15             Perform  Capillary Blood Glucose checks as needed.  Exercise Prescription Changes:   Exercise Comments:   Exercise Goals and Review:   Exercise Goals     Row Name 03/14/22 0915             Exercise Goals   Increase Physical Activity Yes       Intervention Provide advice, education, support and counseling about physical activity/exercise needs.;Develop an individualized exercise prescription for aerobic and resistive training based on initial evaluation findings, risk stratification, comorbidities and participant's personal goals.       Expected Outcomes Short Term: Attend rehab on a regular basis to increase amount of physical activity.;Long Term: Add in home exercise to make exercise part of routine and to increase amount of physical activity.;Long Term: Exercising regularly at least 3-5 days a week.       Increase Strength and Stamina Yes       Intervention Provide advice, education, support and counseling about physical activity/exercise needs.;Develop an individualized exercise prescription for aerobic and resistive training based on initial evaluation findings, risk stratification, comorbidities and participant's personal goals.       Expected Outcomes Short Term: Perform resistance training exercises routinely during rehab and add in resistance training at home;Short Term: Increase workloads from initial exercise prescription for resistance, speed, and METs.;Long Term: Improve cardiorespiratory fitness, muscular endurance and strength as measured by increased METs and functional capacity (6MWT)       Able to understand and use rate of perceived exertion (RPE) scale Yes       Intervention Provide education and explanation on how to use RPE scale       Expected Outcomes Short Term: Able to use RPE daily in rehab to express subjective intensity level;Long Term:  Able to use RPE to guide intensity level when exercising independently       Knowledge and understanding of Target Heart Rate Range  (THRR) Yes       Intervention Provide education and explanation of THRR including how the numbers were predicted and where they are located for reference       Expected Outcomes Short Term: Able to state/look up THRR;Long Term: Able to use THRR to govern intensity when exercising independently;Short Term: Able to use daily as guideline for intensity in rehab       Able to check pulse independently Yes       Intervention Provide education and demonstration on how to check pulse in carotid and radial arteries.;Review the importance of being able to check your own pulse for safety during independent exercise       Expected Outcomes Short Term: Able to explain why pulse checking is important during independent exercise;Long Term: Able to check pulse independently and accurately       Understanding of Exercise Prescription Yes       Intervention Provide education, explanation, and written materials on patient's individual exercise prescription       Expected Outcomes Short Term: Able to explain program exercise prescription;Long Term: Able to explain home exercise prescription to exercise independently                Exercise Goals Re-Evaluation :    Discharge Exercise Prescription (Final Exercise Prescription Changes):   Nutrition:  Target Goals: Understanding of nutrition guidelines, daily intake of sodium 1500mg , cholesterol 200mg , calories 30% from fat and 7% or less from saturated fats, daily to have 5 or  more servings of fruits and vegetables.  Biometrics:  Pre Biometrics - 03/14/22 0915       Pre Biometrics   Height 5\' 10"  (1.778 m)    Weight 112.5 kg    Waist Circumference 47.5 inches    Hip Circumference 47 inches    Waist to Hip Ratio 1.01 %    BMI (Calculated) 35.59    Triceps Skinfold 47 mm    % Body Fat 38.3 %    Grip Strength 37.6 kg    Flexibility 0 in    Single Leg Stand 60 seconds              Nutrition Therapy Plan and Nutrition Goals:  Nutrition  Therapy & Goals - 03/14/22 0906       Personal Nutrition Goals   Comments Patient scored 21 on his diet assessment. Handout provided and explained regarding healthier choices. Patient verbalized understanding. He says he cooks and is trying to eat heart healthy and eats no junk food. We offer 2 educational sessions on heart healthy nutrition with handouts.      Intervention Plan   Intervention Nutrition handout(s) given to patient.    Expected Outcomes Short Term Goal: Understand basic principles of dietary content, such as calories, fat, sodium, cholesterol and nutrients.             Nutrition Assessments:  Nutrition Assessments - 03/14/22 0906       MEDFICTS Scores   Pre Score 21            MEDIFICTS Score Key: ?70 Need to make dietary changes  40-70 Heart Healthy Diet ? 40 Therapeutic Level Cholesterol Diet   Picture Your Plate Scores: 03/16/22 Unhealthy dietary pattern with much room for improvement. 41-50 Dietary pattern unlikely to meet recommendations for good health and room for improvement. 51-60 More healthful dietary pattern, with some room for improvement.  >60 Healthy dietary pattern, although there may be some specific behaviors that could be improved.    Nutrition Goals Re-Evaluation:   Nutrition Goals Discharge (Final Nutrition Goals Re-Evaluation):   Psychosocial: Target Goals: Acknowledge presence or absence of significant depression and/or stress, maximize coping skills, provide positive support system. Participant is able to verbalize types and ability to use techniques and skills needed for reducing stress and depression.  Initial Review & Psychosocial Screening:  Initial Psych Review & Screening - 03/14/22 0917       Initial Review   Current issues with None Identified      Family Dynamics   Good Support System? Yes      Barriers   Psychosocial barriers to participate in program There are no identifiable barriers or psychosocial needs.       Screening Interventions   Interventions Encouraged to exercise;To provide support and resources with identified psychosocial needs    Expected Outcomes Short Term goal: Identification and review with participant of any Quality of Life or Depression concerns found by scoring the questionnaire.             Quality of Life Scores:  Quality of Life - 03/14/22 0912       Quality of Life   Select Quality of Life      Quality of Life Scores   Health/Function Pre 17.64 %    Socioeconomic Pre 22.81 %    Psych/Spiritual Pre 28.75 %    Family Pre 26.7 %    GLOBAL Pre 22.29 %  Scores of 19 and below usually indicate a poorer quality of life in these areas.  A difference of  2-3 points is a clinically meaningful difference.  A difference of 2-3 points in the total score of the Quality of Life Index has been associated with significant improvement in overall quality of life, self-image, physical symptoms, and general health in studies assessing change in quality of life.  PHQ-9: Review Flowsheet        03/14/2022  Depression screen PHQ 2/9  Decreased Interest 2  Down, Depressed, Hopeless 0  PHQ - 2 Score 2  Altered sleeping 0  Tired, decreased energy 3  Change in appetite 0  Feeling bad or failure about yourself  0  Trouble concentrating 0  Moving slowly or fidgety/restless 0  Suicidal thoughts 0  PHQ-9 Score 5  Difficult doing work/chores Somewhat difficult         Interpretation of Total Score  Total Score Depression Severity:  1-4 = Minimal depression, 5-9 = Mild depression, 10-14 = Moderate depression, 15-19 = Moderately severe depression, 20-27 = Severe depression   Psychosocial Evaluation and Intervention:  Psychosocial Evaluation - 03/14/22 0919       Psychosocial Evaluation & Interventions   Interventions Relaxation education;Stress management education;Encouraged to exercise with the program and follow exercise prescription    Comments Patient has  no psychosocial barriers or issues identified at his orientation visit. His initial PHQ-9 score was 5 due to his COPD and his inability to do activities he wants to do. He scored lowest in heatlh and function on his QOL at 17% with an overall score of 22%. He denies any depression or anxiety. He lives with is wife of many years. He has one daugther that lives in [place] and he has a Teaching laboratory technician. He names his wife as his main support person. He says they supprot each other. He also has COPD and reports gaining 20 lbs over the past few months that he believes is worsening his SOB. He says his SOB is what limits him from doing anything. He is followed by Dr. [name] for his COPD. He quit smoking 01/15/22 without any assistance. He has a 32 pack year history. He is ready to start the program hoping to be able to get back to doing the activities he wants to do.    Expected Outcomes Patient will continue to have no psychosocial barriers or issues identified.    Continue Psychosocial Services  No Follow up required             Psychosocial Re-Evaluation:   Psychosocial Discharge (Final Psychosocial Re-Evaluation):   Vocational Rehabilitation: Provide vocational rehab assistance to qualifying candidates.   Vocational Rehab Evaluation & Intervention:  Vocational Rehab - 03/14/22 0908       Initial Vocational Rehab Evaluation & Intervention   Assessment shows need for Vocational Rehabilitation No      Vocational Rehab Re-Evaulation   Comments Patient is disabled and does not need vocational rehab.             Education: Education Goals: Education classes will be provided on a weekly basis, covering required topics. Participant will state understanding/return demonstration of topics presented.  Learning Barriers/Preferences:  Learning Barriers/Preferences - 03/14/22 0907       Learning Barriers/Preferences   Learning Barriers None    Learning Preferences Audio;Skilled Demonstration              Education Topics: Hypertension, Hypertension Reduction -Define heart disease and high blood  pressure. Discus how high blood pressure affects the body and ways to reduce high blood pressure.   Exercise and Your Heart -Discuss why it is important to exercise, the FITT principles of exercise, normal and abnormal responses to exercise, and how to exercise safely.   Angina -Discuss definition of angina, causes of angina, treatment of angina, and how to decrease risk of having angina.   Cardiac Medications -Review what the following cardiac medications are used for, how they affect the body, and side effects that may occur when taking the medications.  Medications include Aspirin, Beta blockers, calcium channel blockers, ACE Inhibitors, angiotensin receptor blockers, diuretics, digoxin, and antihyperlipidemics.   Congestive Heart Failure -Discuss the definition of CHF, how to live with CHF, the signs and symptoms of CHF, and how keep track of weight and sodium intake.   Heart Disease and Intimacy -Discus the effect sexual activity has on the heart, how changes occur during intimacy as we age, and safety during sexual activity.   Smoking Cessation / COPD -Discuss different methods to quit smoking, the health benefits of quitting smoking, and the definition of COPD.   Nutrition I: Fats -Discuss the types of cholesterol, what cholesterol does to the heart, and how cholesterol levels can be controlled.   Nutrition II: Labels -Discuss the different components of food labels and how to read food label   Heart Parts/Heart Disease and PAD -Discuss the anatomy of the heart, the pathway of blood circulation through the heart, and these are affected by heart disease.   Stress I: Signs and Symptoms -Discuss the causes of stress, how stress may lead to anxiety and depression, and ways to limit stress.   Stress II: Relaxation -Discuss different types of relaxation  techniques to limit stress.   Warning Signs of Stroke / TIA -Discuss definition of a stroke, what the signs and symptoms are of a stroke, and how to identify when someone is having stroke.   Knowledge Questionnaire Score:  Knowledge Questionnaire Score - 03/14/22 0907       Knowledge Questionnaire Score   Pre Score 20/24             Core Components/Risk Factors/Patient Goals at Admission:  Personal Goals and Risk Factors at Admission - 03/14/22 0913       Core Components/Risk Factors/Patient Goals on Admission    Weight Management Yes    Intervention Weight Management: Provide education and appropriate resources to help participant work on and attain dietary goals.    Admit Weight 248 lb 3.2 oz (112.6 kg)    Goal Weight: Short Term 243 lb (110.2 kg)    Goal Weight: Long Term 238 lb (108 kg)    Expected Outcomes Weight Gain: Understanding of general recommendations for a high calorie, high protein meal plan that promotes weight gain by distributing calorie intake throughout the day with the consumption for 4-5 meals, snacks, and/or supplements    Tobacco Cessation Yes    Number of packs per day 0    Intervention Assist the participant in steps to quit. Provide individualized education and counseling about committing to Tobacco Cessation, relapse prevention, and pharmacological support that can be provided by physician.;Education officer, environmental, assist with locating and accessing local/national Quit Smoking programs, and support quit date choice.    Expected Outcomes Long Term: Complete abstinence from all tobacco products for at least 12 months from quit date.    Improve shortness of breath with ADL's Yes    Intervention Provide education, individualized exercise  plan and daily activity instruction to help decrease symptoms of SOB with activities of daily living.    Expected Outcomes Short Term: Improve cardiorespiratory fitness to achieve a reduction of symptoms when  performing ADLs;Long Term: Be able to perform more ADLs without symptoms or delay the onset of symptoms    Personal Goal Other Yes    Personal Goal Patient wants to lose some weight; improve his SOB; and be able to return to his ADL's.    Intervention Patient will attend CR with exercise and education 3 days/week and supplement with exercise at home 2 days/week.    Expected Outcomes Patient will complete the program meeting both personal and program goals.             Core Components/Risk Factors/Patient Goals Review:    Core Components/Risk Factors/Patient Goals at Discharge (Final Review):    ITP Comments:   Comments: Patient arrived for 1st visit/orientation/education at 0800. Patient was referred to CR by Dr. Rollene Rotunda due to NSTEMI (I21.4) and s/P Coronary Artery Stent Placement (Z95.5). During orientation advised patient on arrival and appointment times what to wear, what to do before, during and after exercise. Reviewed attendance and class policy.  Pt is scheduled to return Cardiac Rehab on 03/19/22 at 0815. Pt was advised to come to class 15 minutes before class starts.  Discussed RPE/Dpysnea scales. Patient participated in warm up stretches. Patient was able to complete 6 minute walk test.  Telemetry:NSR. Patient was measured for the equipment. Discussed equipment safety with patient. Took patient pre-anthropometric measurements. Patient finished visit at 0915.

## 2022-03-17 ENCOUNTER — Other Ambulatory Visit (HOSPITAL_COMMUNITY): Payer: Self-pay

## 2022-03-17 ENCOUNTER — Telehealth: Payer: Self-pay | Admitting: Pharmacy Technician

## 2022-03-17 NOTE — Telephone Encounter (Signed)
Patient Advocate Encounter  Received notification from [employer] that RENEWAL prior authorization for BREZTRI is required.   PA submitted on 6.3.23 BUT NOT NEEDED Key [id number] Status is pending   [facility] will continue to follow  Ricke Hey, CPhT Patient Advocate Phone: [phone]

## 2022-03-19 ENCOUNTER — Encounter (HOSPITAL_COMMUNITY)
Admission: RE | Admit: 2022-03-19 | Discharge: 2022-03-19 | Disposition: A | Discharge status: Home / Self Care | Payer: Medicaid Other | Source: Ambulatory Visit | Attending: Cardiology | Admitting: Cardiology

## 2022-03-19 DIAGNOSIS — I214 Non-ST elevation (NSTEMI) myocardial infarction: Secondary | ICD-10-CM | POA: Insufficient documentation

## 2022-03-19 DIAGNOSIS — Z955 Presence of coronary angioplasty implant and graft: Secondary | ICD-10-CM | POA: Insufficient documentation

## 2022-03-19 NOTE — Progress Notes (Signed)
Daily Session Note  Patient Details  Name: James Huynh MRN: [id number] Date of Birth: 1961/08/08 Referring Provider:   Flowsheet Row CARDIAC REHAB PHASE II ORIENTATION from 03/14/2022 in [place]  Referring Provider Dr. Percival Spanish       Encounter Date: 03/19/2022  Check In:  Session Check In - 03/19/22 0838       Check-In   Supervising physician immediately available to respond to emergencies [facility] MD immediately available    Physician(s) Dr. Marlou Porch    Location [facility]    Staff Present Aundra Dubin, RN, Bjorn Loser, MS, ACSM-CEP, Exercise Physiologist    Virtual Visit No    Medication changes reported     No    Fall or balance concerns reported    No    Tobacco Cessation No Change    Warm-up and Cool-down Performed as group-led instruction    Resistance Training Performed Yes    VAD Patient? No    PAD/SET Patient? No      Pain Assessment   Currently in Pain? No/denies    Pain Score 0-No pain    Multiple Pain Sites No             Capillary Blood Glucose: No results found for this or any previous visit (from the past 24 hour(s)).    Social History   Tobacco Use  Smoking Status Former   Packs/day: 1.00   Years: 32.00   Pack years: 32.00   Types: Cigarettes   Start date: 08/06/1975   Quit date: 01/30/2021   Years since quitting: 1.1   Passive exposure: Current  Smokeless Tobacco Never    Goals Met:  Independence with exercise equipment Exercise tolerated well No report of concerns or symptoms today Strength training completed today  Goals Unmet:  Not Applicable  Comments: Check out 915.   Dr. Carlyle Dolly is Medical Director for [facility] Cardiac Rehab

## 2022-03-21 ENCOUNTER — Encounter (HOSPITAL_COMMUNITY)
Admission: RE | Admit: 2022-03-21 | Discharge: 2022-03-21 | Disposition: A | Discharge status: Home / Self Care | Payer: Medicaid Other | Source: Ambulatory Visit | Attending: Cardiology | Admitting: Cardiology

## 2022-03-21 DIAGNOSIS — I214 Non-ST elevation (NSTEMI) myocardial infarction: Secondary | ICD-10-CM

## 2022-03-21 DIAGNOSIS — Z955 Presence of coronary angioplasty implant and graft: Secondary | ICD-10-CM

## 2022-03-23 ENCOUNTER — Encounter (HOSPITAL_COMMUNITY): Payer: Medicaid Other

## 2022-03-26 ENCOUNTER — Encounter (HOSPITAL_COMMUNITY): Payer: Medicaid Other

## 2022-03-28 ENCOUNTER — Encounter (HOSPITAL_COMMUNITY): Payer: Medicaid Other

## 2022-03-28 NOTE — Progress Notes (Signed)
Discharge Progress Report  Patient Details  Name: James Huynh MRN: [id number] Date of Birth: 18-Jun-1961 Referring Provider:   Flowsheet Row CARDIAC REHAB PHASE II [facility]ATION [facility]  Referring Provider Dr. Antoine PocheHochrein        Number of Visits: 3  Reason for Discharge:  Early Exit:  Personal  Smoking History:  Social History   Tobacco Use  Smoking Status Former   Packs/day: 1.00   Years: 32.00   Total pack years: 32.00   Types: Cigarettes   Start date: 08/06/1975   Quit date: 01/30/2021   Years since quitting: 1.1   Passive exposure: Current  Smokeless Tobacco Never    Diagnosis:  NSTEMI (non-ST elevated myocardial infarction) (HCC)  Status post coronary artery stent placement  ADL UCSD:   Initial Exercise Prescription:  Initial Exercise Prescription - 03/14/22 0900       Date of Initial Exercise RX and Referring Provider   Date 03/14/22    Referring Provider Dr. Antoine PocheHochrein    Expected Discharge Date 06/08/22      NuStep   Level 1    SPM 80    Minutes 22      Arm Ergometer   Level 1    RPM 60    Minutes 17      Prescription Details   Frequency (times per week) 3    Duration Progress to 30 minutes of continuous aerobic without signs/symptoms of physical distress      Intensity   THRR 40-80% of Max Heartrate 64-128    Ratings of Perceived Exertion 11-13    Perceived Dyspnea 0-4      Resistance Training   Training Prescription Yes    Weight 4    Reps 10-15             Discharge Exercise Prescription (Final Exercise Prescription Changes):  Exercise Prescription Changes - 03/19/22 1500       Response to Exercise   Blood Pressure (Admit) 100/60    Blood Pressure (Exercise) 100/60    Blood Pressure (Exit) 90/60    Heart Rate (Admit) 82 bpm    Heart Rate (Exercise) 86 bpm    Heart Rate (Exit) 72 bpm    Rating of Perceived Exertion (Exercise) 14    Duration Continue with 30 min of aerobic  exercise without signs/symptoms of physical distress.    Intensity THRR unchanged      Progression   Progression Continue to progress workloads to maintain intensity without signs/symptoms of physical distress.      Resistance Training   Training Prescription Yes    Weight 4    Reps 10-15    Time 10 Minutes      NuStep   Level 1    SPM 79    Minutes 22    METs 1.87      Arm Ergometer   Level 1    RPM 63    Minutes 17    METs 1.56             Functional Capacity:  6 Minute Walk     Row Name 03/14/22 0913         6 Minute Walk   Distance 1000 feet     Walk Time 6 minutes     # of Rest Breaks 0     MPH 1.89     METS 2.24     RPE 12     VO2 Peak 7.83  Symptoms Yes (comment)     Comments bilateral hip pain 2/10     Resting HR 72 bpm     Resting BP 90/70     Resting Oxygen Saturation  96 %     Exercise Oxygen Saturation  during 6 min walk 97 %     Max Ex. HR 90 bpm     Max Ex. BP 112/76     2 Minute Post BP 102/70              Psychological, QOL, Others - Outcomes: PHQ 2/9:    03/14/2022    9:04 AM  Depression screen PHQ 2/9  Decreased Interest 2  Down, Depressed, Hopeless 0  PHQ - 2 Score 2  Altered sleeping 0  Tired, decreased energy 3  Change in appetite 0  Feeling bad or failure about yourself  0  Trouble concentrating 0  Moving slowly or fidgety/restless 0  Suicidal thoughts 0  PHQ-9 Score 5  Difficult doing work/chores Somewhat difficult    Quality of Life:  Quality of Life - 03/14/22 0912       Quality of Life   Select Quality of Life      Quality of Life Scores   Health/Function Pre 17.64 %    Socioeconomic Pre 22.81 %    Psych/Spiritual Pre 28.75 %    Family Pre 26.7 %    GLOBAL Pre 22.29 %             Personal Goals: Goals established at orientation with interventions provided to work toward goal.  Personal Goals and Risk Factors at Admission - 03/14/22 0913       Core Components/Risk Factors/Patient Goals  on Admission    Weight Management Yes    Intervention Weight Management: Provide education and appropriate resources to help participant work on and attain dietary goals.    Admit Weight 248 lb 3.2 oz (112.6 kg)    Goal Weight: Short Term 243 lb (110.2 kg)    Goal Weight: Long Term 238 lb (108 kg)    Expected Outcomes Weight Gain: Understanding of general recommendations for a high calorie, high protein meal plan that promotes weight gain by distributing calorie intake throughout the day with the consumption for 4-5 meals, snacks, and/or supplements    Tobacco Cessation Yes    Number of packs per day 0    Intervention Assist the participant in steps to quit. Provide individualized education and counseling about committing to Tobacco Cessation, relapse prevention, and pharmacological support that can be provided by physician.;Education officer, environmental, assist with locating and accessing local/national Quit Smoking programs, and support quit date choice.    Expected Outcomes Long Term: Complete abstinence from all tobacco products for at least 12 months from quit date.    Improve shortness of breath with ADL's Yes    Intervention Provide education, individualized exercise plan and daily activity instruction to help decrease symptoms of SOB with activities of daily living.    Expected Outcomes Short Term: Improve cardiorespiratory fitness to achieve a reduction of symptoms when performing ADLs;Long Term: Be able to perform more ADLs without symptoms or delay the onset of symptoms    Personal Goal Other Yes    Personal Goal Patient wants to lose some weight; improve his SOB; and be able to return to his ADL's.    Intervention Patient will attend [facility] with exercise and education 3 days/week and supplement with exercise at home 2 days/week.    Expected Outcomes Patient  will complete the program meeting both personal and program goals.              Personal Goals Discharge:  Goals and Risk  Factor Review     Row Name 03/26/22 0929             Core Components/Risk Factors/Patient Goals Review   Personal Goals Review Weight Management/Obesity;Tobacco Cessation;Other;Improve shortness of breath with ADL's       Review Patient was referred to [facility] with NSTEMI and stent placement. He has multiple risk factors for CAD and is participating in the program for risk modification. He has completed 3 sessions. His current weight is 245.7 lbs down 2.3 lbs from his initial visit. His personal goals for the program are to be able to return to his ADL's; lose weight; and improve his SOB. He quit smoking 4/23 without any assistance and remains tobacco free. He is having an issue with having enough money for gas to get to [facility] and is trying to work with his insurance for reimbusement. We will continue to monitor his progress as he works towards meeting his personal goals.       Expected Outcomes Patient will complete the program meeting both personal and program goals.                Exercise Goals and Review:  Exercise Goals     Row Name 03/14/22 0915             Exercise Goals   Increase Physical Activity Yes       Intervention Provide advice, education, support and counseling about physical activity/exercise needs.;Develop an individualized exercise prescription for aerobic and resistive training based on initial evaluation findings, risk stratification, comorbidities and participant's personal goals.       Expected Outcomes Short Term: Attend rehab on a regular basis to increase amount of physical activity.;Long Term: Add in home exercise to make exercise part of routine and to increase amount of physical activity.;Long Term: Exercising regularly at least 3-5 days a week.       Increase Strength and Stamina Yes       Intervention Provide advice, education, support and counseling about physical activity/exercise needs.;Develop an individualized exercise prescription for aerobic and  resistive training based on initial evaluation findings, risk stratification, comorbidities and participant's personal goals.       Expected Outcomes Short Term: Perform resistance training exercises routinely during rehab and add in resistance training at home;Short Term: Increase workloads from initial exercise prescription for resistance, speed, and METs.;Long Term: Improve cardiorespiratory fitness, muscular endurance and strength as measured by increased METs and functional capacity ( )       Able to understand and use rate of perceived exertion (RPE) scale Yes       Intervention Provide education and explanation on how to use RPE scale       Expected Outcomes Short Term: Able to use RPE daily in rehab to express subjective intensity level;Long Term:  Able to use RPE to guide intensity level when exercising independently       Knowledge and understanding of Target Heart Rate Range (THRR) Yes       Intervention Provide education and explanation of THRR including how the numbers were predicted and where they are located for reference       Expected Outcomes Short Term: Able to state/look up THRR;Long Term: Able to use THRR to govern intensity when exercising independently;Short Term: Able to use daily as guideline for intensity  in rehab       Able to check pulse independently Yes       Intervention Provide education and demonstration on how to check pulse in carotid and radial arteries.;Review the importance of being able to check your own pulse for safety during independent exercise       Expected Outcomes Short Term: Able to explain why pulse checking is important during independent exercise;Long Term: Able to check pulse independently and accurately       Understanding of Exercise Prescription Yes       Intervention Provide education, explanation, and written materials on patient's individual exercise prescription       Expected Outcomes Short Term: Able to explain program exercise  prescription;Long Term: Able to explain home exercise prescription to exercise independently                Exercise Goals Re-Evaluation:   Nutrition & Weight - Outcomes:  Pre Biometrics - 03/14/22 0915       Pre Biometrics   Height 5\' 10"  (1.778 m)    Weight 248 lb 0.3 oz (112.5 kg)    Waist Circumference 47.5 inches    Hip Circumference 47 inches    Waist to Hip Ratio 1.01 %    BMI (Calculated) 35.59    Triceps Skinfold 47 mm    % Body Fat 38.3 %    Grip Strength 37.6 kg    Flexibility 0 in    Single Leg Stand 60 seconds              Nutrition:  Nutrition Therapy & Goals - 03/14/22 0906       Personal Nutrition Goals   Comments Patient scored 21 on his diet assessment. Handout provided and explained regarding healthier choices. Patient verbalized understanding. He says he cooks and is trying to eat heart healthy and eats no junk food. We offer 2 educational sessions on heart healthy nutrition with handouts.      Intervention Plan   Intervention Nutrition handout(s) given to patient.    Expected Outcomes Short Term Goal: Understand basic principles of dietary content, such as calories, fat, sodium, cholesterol and nutrients.             Nutrition Discharge:  Nutrition Assessments - 03/14/22 0906       MEDFICTS Scores   Pre Score 21             Education Questionnaire Score:  Knowledge Questionnaire Score - 03/14/22 0907       Knowledge Questionnaire Score   Pre Score 20/24             Pt discharged from CR on 03/28/2022 after 3 sessions. He called and stated that he would be unable to continue due to transportation and finances

## 2022-03-30 ENCOUNTER — Encounter (HOSPITAL_COMMUNITY): Payer: Medicaid Other

## 2022-04-02 ENCOUNTER — Encounter (HOSPITAL_COMMUNITY): Payer: Medicaid Other

## 2022-04-04 ENCOUNTER — Encounter (HOSPITAL_COMMUNITY): Payer: Medicaid Other

## 2022-04-06 ENCOUNTER — Encounter (HOSPITAL_COMMUNITY): Payer: Medicaid Other

## 2022-04-09 ENCOUNTER — Encounter (HOSPITAL_COMMUNITY): Payer: Medicaid Other

## 2022-04-11 ENCOUNTER — Encounter (HOSPITAL_COMMUNITY): Payer: Medicaid Other

## 2022-04-13 ENCOUNTER — Encounter (HOSPITAL_COMMUNITY): Payer: Medicaid Other

## 2022-04-13 ENCOUNTER — Other Ambulatory Visit (HOSPITAL_COMMUNITY): Payer: Self-pay

## 2022-04-13 ENCOUNTER — Other Ambulatory Visit: Payer: Self-pay | Admitting: Internal Medicine

## 2022-04-13 MED ORDER — BREZTRI AEROSPHERE 160-9-4.8 MCG/ACT IN AERO
2.0000 | INHALATION_SPRAY | Freq: Two times a day (BID) | RESPIRATORY_TRACT | 6 refills | Status: DC
  Filled 2022-04-13: qty 10.7, 30d supply, fill #0

## 2022-04-16 ENCOUNTER — Other Ambulatory Visit (HOSPITAL_COMMUNITY): Payer: Self-pay

## 2022-04-16 ENCOUNTER — Encounter (HOSPITAL_COMMUNITY): Payer: Medicaid Other

## 2022-04-18 ENCOUNTER — Encounter (HOSPITAL_COMMUNITY): Payer: Medicaid Other

## 2022-04-20 ENCOUNTER — Encounter (HOSPITAL_COMMUNITY): Payer: Medicaid Other

## 2022-04-23 ENCOUNTER — Encounter (HOSPITAL_COMMUNITY): Payer: Medicaid Other

## 2022-04-24 ENCOUNTER — Ambulatory Visit: Payer: Medicaid Other | Admitting: Cardiology

## 2022-04-25 ENCOUNTER — Other Ambulatory Visit: Payer: Self-pay | Admitting: Cardiology

## 2022-04-25 ENCOUNTER — Encounter (HOSPITAL_COMMUNITY): Payer: Medicaid Other

## 2022-04-27 ENCOUNTER — Encounter (HOSPITAL_COMMUNITY): Payer: Medicaid Other

## 2022-04-30 ENCOUNTER — Encounter (HOSPITAL_COMMUNITY): Payer: Medicaid Other

## 2022-05-02 ENCOUNTER — Encounter (HOSPITAL_COMMUNITY): Payer: Medicaid Other

## 2022-05-03 ENCOUNTER — Other Ambulatory Visit (HOSPITAL_COMMUNITY): Payer: Self-pay

## 2022-05-04 ENCOUNTER — Encounter (HOSPITAL_COMMUNITY): Payer: Medicaid Other

## 2022-05-07 ENCOUNTER — Encounter (HOSPITAL_COMMUNITY): Payer: Medicaid Other

## 2022-05-09 ENCOUNTER — Encounter (HOSPITAL_COMMUNITY): Payer: Medicaid Other

## 2022-05-11 ENCOUNTER — Encounter (HOSPITAL_COMMUNITY): Payer: Medicaid Other

## 2022-05-14 ENCOUNTER — Encounter (HOSPITAL_COMMUNITY): Payer: Medicaid Other

## 2022-05-16 ENCOUNTER — Encounter (HOSPITAL_COMMUNITY): Payer: Medicaid Other

## 2022-05-18 ENCOUNTER — Encounter (HOSPITAL_COMMUNITY): Payer: Medicaid Other

## 2022-05-21 ENCOUNTER — Encounter (HOSPITAL_COMMUNITY): Payer: Medicaid Other

## 2022-05-21 NOTE — Progress Notes (Unsigned)
Cardiology Office Note   Date:  05/23/2022   ID:  James Huynh, DOB 1961-07-05, MRN [id number]  PCP:  Lovey Newcomer, PA  Cardiologist:   Rollene Rotunda, MD   Chief Complaint  Patient presents with   Coronary Artery Disease      History of Present Illness: James Huynh is a 61 y.o. male who presents for follow up of CAD.  Cardiac cath 2016 :  20% proximal RCA, 80% proximal to mid circumflex with DES, 80% mid LAD with DES.  He was hospitalized from 01/15/2022-01/18/2022 in the setting of NSTEMI. Echocardiogram showed EF 60 to 65%, no RWMA, no significant valvular abnormalities. Cardiac catheterization on 01/17/2022 showed mLCX 60% (ISR), pLAD 30% (ISR), pRCA 99-0% s/p aspiration thrombectomy, DES, and p-mRCA 20%. Aggressive risk factor modification with 12 months DAPT with ASA and Brilinta   Since he was last seen he has done well.  He did have increased ankle swelling and had to have his Lasix increased from 20-40.  This seems to have helped.   The patient denies any new symptoms such as chest discomfort, neck or arm discomfort. There has been no new shortness of breath, PND or orthopnea. There have been no reported palpitations, presyncope or syncope.    Past Medical History:  Diagnosis Date   Asthma    CAD (coronary artery disease)    cath 09/09/2015 20% prox RCA, 80% prox to mid LCx treated with DES (3.518 mm Xience Alpine DES), 80% mid LAD lesion treated with 3.518 mm Xience Alpine DES stent postdilated to 4.2.   Chest pain    a. negative stress echo August 2011   GERD (gastroesophageal reflux disease)    High cholesterol    History of pneumonia    a. 2014.   Hypothyroidism    Morbid obesity (HCC)    Tobacco abuse     Past Surgical History:  Procedure Laterality Date   CARDIAC CATHETERIZATION N/A 09/09/2015   Procedure: Left Heart Cath and Coronary Angiography;  Surgeon: Lennette Bihari, MD;  Location: [facility];  Service: Cardiovascular;  Laterality:  N/A;   CARDIAC CATHETERIZATION  09/09/2015   Procedure: Coronary Stent Intervention;  Surgeon: Lennette Bihari, MD;  Location: [facility];  Service: Cardiovascular;;   CORONARY STENT INTERVENTION N/A 01/17/2022   Procedure: CORONARY STENT INTERVENTION;  Surgeon: Iran Ouch, MD;  Location: [facility];  Service: Cardiovascular;  Laterality: N/A;   LEFT HEART CATH AND CORONARY ANGIOGRAPHY N/A 01/17/2022   Procedure: LEFT HEART CATH AND CORONARY ANGIOGRAPHY;  Surgeon: Iran Ouch, MD;  Location: [facility];  Service: Cardiovascular;  Laterality: N/A;   lymph gland removal     MANDIBLE SURGERY     broken jaw     Current Outpatient Medications  Medication Sig Dispense Refill   albuterol (PROVENTIL) (2.5 MG/3ML) 0.083% nebulizer solution Take 2.5 mg by nebulization every 6 (six) hours as needed for wheezing or shortness of breath.     albuterol (VENTOLIN HFA) 108 (90 Base) MCG/ACT inhaler Inhale 2 puffs into the lungs every 6 (six) hours as needed for wheezing or shortness of breath.     aspirin EC 81 MG tablet Take 81 mg by mouth every evening.     Budeson-Glycopyrrol-Formoterol (BREZTRI AEROSPHERE) 160-9-4.8 MCG/ACT AERO Inhale 2 puffs into the lungs 2 (two) times daily. 10.7 g 11   Cholecalciferol (VITAMIN D3 PO) Take 1 tablet by mouth in the morning.  ezetimibe (ZETIA) 10 MG tablet Take 1 tablet by mouth once daily 90 tablet 3   furosemide (LASIX) 40 MG tablet Take 1 tablet (40 mg total) by mouth daily. 90 tablet 3   levothyroxine (SYNTHROID) 150 MCG tablet Take 150 mcg by mouth daily.     metoprolol succinate (TOPROL-XL) 25 MG 24 hr tablet Take 1 tablet (25 mg total) by mouth at bedtime. 90 tablet 3   nitroGLYCERIN (NITROSTAT) 0.4 MG SL tablet PLACE 1 TABLET UNDER THE TONGUE EVERY 5 MIN UP TO 3 TIMES AS NEEDED FOR CHEST PAIN 25 tablet 1   omeprazole (PRILOSEC) 40 MG capsule Take 1 capsule by mouth once daily 30 capsule 2   rosuvastatin (CRESTOR) 40 MG tablet  Take 1 tablet (40 mg total) by mouth daily. (Patient taking differently: Take 40 mg by mouth at bedtime.) 90 tablet 3   ticagrelor (BRILINTA) 90 MG TABS tablet Take 1 tablet (90 mg total) by mouth 2 (two) times daily. 180 tablet 2   No current facility-administered medications for this visit.    Allergies:   Penicillins    ROS:  Please see the history of present illness.   Otherwise, review of systems are positive for questionable for none.   All other systems are reviewed and negative.    PHYSICAL EXAM: VS:  BP 108/74   Pulse 80   Ht 5\' 10"  (1.778 m)   Wt 240 lb (108.9 kg)   BMI 34.44 kg/m  , BMI Body mass index is 34.44 kg/m. GENERAL:  Well appearing NECK:  No jugular venous distention, waveform within normal limits, carotid upstroke brisk and symmetric, no bruits, no thyromegaly LUNGS:  Clear to auscultation bilaterally CHEST:  Unremarkable HEART:  PMI not displaced or sustained,S1 and S2 within normal limits, no S3, no S4, no clicks, no rubs, no murmurs ABD:  Flat, positive bowel sounds normal in frequency in pitch, no bruits, no rebound, no guarding, no midline pulsatile mass, no hepatomegaly, no splenomegaly EXT:  2 plus pulses throughout, no edema, no cyanosis no clubbing   EKG:  EKG is not  ordered today. NA    Recent Labs: 01/18/2022: BUN 9; Creatinine, Ser 1.04; Hemoglobin 13.0; Platelets 178; Potassium 3.7; Sodium 137     Wt Readings from Last 3 Encounters:  05/23/22 240 lb (108.9 kg)  03/14/22 248 lb 0.3 oz (112.5 kg)  02/19/22 248 lb 3.2 oz (112.6 kg)    CATH:   01/17/2022  Diagnostic  Diagnostic Dominance: Right  Intervention    Other studies Reviewed: Additional studies/ records that were reviewed today include: Hospital records. Review of the above records demonstrates:  NA    ASSESSMENT AND PLAN:  CAD in native artery :   He is doing well post stenting.  No change in therapy.    Hyperlipidemia : LDL is 33 with an HDL of 30.  No change in  therapy.   Chronic obstructive pulmonary disease, unspecified COPD type (HCC): He has been educated about the need to stop smoking and says that he is quit.   Morbid obesity (HCC):   We previously talked about diet and increased physical activity.   Current medicines are reviewed at length with the patient today.  The patient does not have concerns regarding medicines.  The following changes have been made: None  Labs/ tests ordered today include: Basic metabolic profile  No orders of the defined types were placed in this encounter.    Disposition:   FU with me in April  of next year   Signed, Minus Breeding, MD  05/23/2022 2:55 PM    [place]

## 2022-05-23 ENCOUNTER — Ambulatory Visit (INDEPENDENT_AMBULATORY_CARE_PROVIDER_SITE_OTHER): Payer: Medicaid Other | Admitting: Cardiology

## 2022-05-23 ENCOUNTER — Encounter (HOSPITAL_COMMUNITY): Payer: Medicaid Other

## 2022-05-23 ENCOUNTER — Encounter: Payer: Self-pay | Admitting: Cardiology

## 2022-05-23 VITALS — BP 108/74 | HR 80 | Ht 70.0 in | Wt 240.0 lb

## 2022-05-23 DIAGNOSIS — E785 Hyperlipidemia, unspecified: Secondary | ICD-10-CM | POA: Diagnosis not present

## 2022-05-23 DIAGNOSIS — I2510 Atherosclerotic heart disease of native coronary artery without angina pectoris: Secondary | ICD-10-CM | POA: Diagnosis not present

## 2022-05-23 MED ORDER — FUROSEMIDE 40 MG PO TABS: 40.0000 mg | ORAL_TABLET | Freq: Every day | ORAL | 3 refills | Status: DC

## 2022-05-23 NOTE — Patient Instructions (Signed)
Medication Instructions:  Please  increase Furosemide 40 mg a day. Continue all other medications as listed.  *If you need a refill on your cardiac medications before your next appointment, please call your pharmacy*  Lab Work: Please have blood work at you PCP office ([facility]) Please fax results to [phone].  If you have labs (blood work) drawn today and your tests are completely normal, you will receive your results only by: MyChart Message (if you have MyChart) OR A paper copy in the mail If you have any lab test that is abnormal or we need to change your treatment, we will call you to review the results.  Follow-Up: At [facility], you and your health needs are our priority.  As part of our continuing mission to provide you with exceptional heart care, we have created designated Provider Care Teams.  These Care Teams include your primary Cardiologist (physician) and Advanced Practice Providers (APPs -  Physician Assistants and Nurse Practitioners) who all work together to provide you with the care you need, when you need it.  We recommend signing up for the patient portal called "MyChart".  Sign up information is provided on this After Visit Summary.  MyChart is used to connect with patients for Virtual Visits (Telemedicine).  Patients are able to view lab/test results, encounter notes, upcoming appointments, etc.  Non-urgent messages can be sent to your provider as well.   To learn more about what you can do with MyChart, go to [email or web address].    Your next appointment:   8 month(s)  The format for your next appointment:   In Person  Provider:   Rollene Rotunda, MD{    Important Information About Sugar

## 2022-05-25 ENCOUNTER — Encounter (HOSPITAL_COMMUNITY): Payer: Medicaid Other

## 2022-05-28 ENCOUNTER — Encounter (HOSPITAL_COMMUNITY): Payer: Medicaid Other

## 2022-05-30 ENCOUNTER — Encounter (HOSPITAL_COMMUNITY): Payer: Medicaid Other

## 2022-06-01 ENCOUNTER — Encounter (HOSPITAL_COMMUNITY): Payer: Medicaid Other

## 2022-06-04 ENCOUNTER — Encounter (HOSPITAL_COMMUNITY): Payer: Medicaid Other

## 2022-06-04 ENCOUNTER — Other Ambulatory Visit: Payer: Self-pay | Admitting: Internal Medicine

## 2022-06-06 ENCOUNTER — Encounter (HOSPITAL_COMMUNITY): Payer: Medicaid Other

## 2022-06-08 ENCOUNTER — Encounter (HOSPITAL_COMMUNITY): Payer: Medicaid Other

## 2022-06-09 ENCOUNTER — Encounter (HOSPITAL_COMMUNITY): Payer: Self-pay

## 2022-06-09 ENCOUNTER — Emergency Department (HOSPITAL_COMMUNITY)
Admission: EM | Admit: 2022-06-09 | Discharge: 2022-06-09 | Disposition: A | Discharge status: Home / Self Care | Payer: Medicaid Other | Attending: Emergency Medicine | Admitting: Emergency Medicine

## 2022-06-09 ENCOUNTER — Emergency Department (HOSPITAL_COMMUNITY): Payer: Medicaid Other

## 2022-06-09 DIAGNOSIS — Z7902 Long term (current) use of antithrombotics/antiplatelets: Secondary | ICD-10-CM | POA: Insufficient documentation

## 2022-06-09 DIAGNOSIS — R0789 Other chest pain: Secondary | ICD-10-CM | POA: Insufficient documentation

## 2022-06-09 DIAGNOSIS — E876 Hypokalemia: Secondary | ICD-10-CM | POA: Diagnosis not present

## 2022-06-09 DIAGNOSIS — Z7982 Long term (current) use of aspirin: Secondary | ICD-10-CM | POA: Insufficient documentation

## 2022-06-09 DIAGNOSIS — M542 Cervicalgia: Secondary | ICD-10-CM | POA: Diagnosis not present

## 2022-06-09 LAB — BASIC METABOLIC PANEL
Anion gap: 9 (ref 5–15)
BUN: 12 mg/dL (ref 6–20)
CO2: 27 mmol/L (ref 22–32)
Calcium: 8.4 mg/dL — ABNORMAL LOW (ref 8.9–10.3)
Chloride: 102 mmol/L (ref 98–111)
Creatinine, Ser: 1.42 mg/dL — ABNORMAL HIGH (ref 0.61–1.24)
GFR, Estimated: 57 mL/min — ABNORMAL LOW (ref >60)
Glucose, Bld: 122 mg/dL — ABNORMAL HIGH (ref 70–99)
Potassium: 2.9 mmol/L — ABNORMAL LOW (ref 3.5–5.1)
Sodium: 138 mmol/L (ref 135–145)

## 2022-06-09 LAB — TROPONIN I (HIGH SENSITIVITY)
Troponin I (High Sensitivity): 5 ng/L (ref <18)
Troponin I (High Sensitivity): 5 ng/L (ref <18)

## 2022-06-09 LAB — CBC
HCT: 42.4 % (ref 39.0–52.0)
Hemoglobin: 14.7 g/dL (ref 13.0–17.0)
MCH: 31.1 pg (ref 26.0–34.0)
MCHC: 34.7 g/dL (ref 30.0–36.0)
MCV: 89.8 fL (ref 80.0–100.0)
Platelets: 221 10*3/uL (ref 150–400)
RBC: 4.72 MIL/uL (ref 4.22–5.81)
RDW: 13.4 % (ref 11.5–15.5)
WBC: 11.4 10*3/uL — ABNORMAL HIGH (ref 4.0–10.5)
nRBC: 0 % (ref 0.0–0.2)

## 2022-06-09 LAB — CBG MONITORING, ED: Glucose-Capillary: 145 mg/dL — ABNORMAL HIGH (ref 70–99)

## 2022-06-09 MED ORDER — METOPROLOL SUCCINATE ER 25 MG PO TB24: 12.5000 mg | ORAL_TABLET | Freq: Every day | ORAL | 3 refills | Status: DC

## 2022-06-09 MED ORDER — POTASSIUM CHLORIDE ER 10 MEQ PO TBCR: 10.0000 meq | EXTENDED_RELEASE_TABLET | Freq: Every day | ORAL | 0 refills | Status: DC

## 2022-06-09 NOTE — ED Provider Notes (Signed)
[facility] EMERGENCY DEPARTMENT Provider Note   CSN: [id number] Arrival date & time: 06/09/22  1819     History  Chief Complaint  Patient presents with   Neck Pain    James Huynh is a 61 y.o. male with history of coronary disease status post stenting presenting to ED with chest pain.  Patient reports that his symptoms began earlier today while he was at rest, with pain that began mostly in the right side of his neck, reminiscent of when he had his last heart attack in April.  The pain was fairly intense at home and he took 4 x 81 mg aspirin tablets, reports that the pain is died down and is nearly gone at this time.  He did not take any nitroglycerin at home and reports that he is taking nitro because it causes severe headache and makes him feel very weak and nauseous.  Medical records show the patient was admitted for an NSTEMI and underwent heart catheterization in April of this year.  He was found to have patent LAD and left circumflex stents with some mild in-stent restenosis of the LAD and moderate restenosis of the left circumflex.  He had new 99% stenosis of the proximal LAD and had a other stent placed in.  He was also found to have 99% stenosis of the proximal RCA, which underwent balloon angioplasty and drug-eluting stent.  He is continued on aspirin and Plavix at home.  He also has a history of severe emphysema, does not wear oxygen at baseline but reports he has very little exercise stamina due to shortness of breath.  HPI     Home Medications Prior to Admission medications   Medication Sig Start Date End Date Taking? Authorizing Provider  potassium chloride (KLOR-CON) 10 MEQ tablet Take 1 tablet (10 mEq total) by mouth daily for 30 doses. 06/09/22 07/09/22 Yes Cleatis Fandrich, Kermit Balo, MD  albuterol (PROVENTIL) (2.5 MG/3ML) 0.083% nebulizer solution Take 2.5 mg by nebulization every 6 (six) hours as needed for wheezing or shortness of breath.    [provider]   albuterol (VENTOLIN HFA) 108 (90 Base) MCG/ACT inhaler Inhale 2 puffs into the lungs every 6 (six) hours as needed for wheezing or shortness of breath.    [provider]  aspirin EC 81 MG tablet Take 81 mg by mouth every evening.    [provider]  Budeson-Glycopyrrol-Formoterol (BREZTRI AEROSPHERE) 160-9-4.8 MCG/ACT AERO Inhale 2 puffs into the lungs 2 (two) times daily. 03/17/21   Nyoka Cowden, MD  Cholecalciferol (VITAMIN D3 PO) Take 1 tablet by mouth in the morning.    [provider]  ezetimibe (ZETIA) 10 MG tablet Take 1 tablet by mouth once daily 04/25/22   Rollene Rotunda, MD  furosemide (LASIX) 40 MG tablet Take 1 tablet (40 mg total) by mouth daily. 05/23/22   Rollene Rotunda, MD  levothyroxine (SYNTHROID) 150 MCG tablet Take 150 mcg by mouth daily. 01/02/22   [provider]  metoprolol succinate (TOPROL-XL) 25 MG 24 hr tablet Take 0.5 tablets (12.5 mg total) by mouth at bedtime. 06/09/22   Terald Sleeper, MD  nitroGLYCERIN (NITROSTAT) 0.4 MG SL tablet PLACE 1 TABLET UNDER THE TONGUE EVERY 5 MIN UP TO 3 TIMES AS NEEDED FOR CHEST PAIN 01/18/22   Rollene Rotunda, MD  omeprazole (PRILOSEC) 40 MG capsule TAKE ONE CAPSULE BY MOUTH ONCE DAILY 06/04/22   Nyoka Cowden, MD  rosuvastatin (CRESTOR) 40 MG tablet Take 1 tablet (40 mg total)  by mouth daily. Patient taking differently: Take 40 mg by mouth at bedtime. 12/13/21   Rollene Rotunda, MD  ticagrelor (BRILINTA) 90 MG TABS tablet Take 1 tablet (90 mg total) by mouth 2 (two) times daily. 01/18/22   Jonita Albee, PA-C      Allergies    Penicillins    Review of Systems   Review of Systems  Physical Exam Updated Vital Signs BP 101/63   Pulse 72   Temp 97.7 F (36.5 C) (Oral)   Resp (!) 22   SpO2 97%  Physical Exam Constitutional:      General: He is not in acute distress. HENT:     Head: Normocephalic and atraumatic.  Eyes:     Conjunctiva/sclera: Conjunctivae normal.     Pupils: Pupils  are equal, round, and reactive to light.  Cardiovascular:     Rate and Rhythm: Normal rate and regular rhythm.  Pulmonary:     Effort: Pulmonary effort is normal. No respiratory distress.  Abdominal:     General: There is no distension.     Tenderness: There is no abdominal tenderness.  Skin:    General: Skin is warm and dry.  Neurological:     General: No focal deficit present.     Mental Status: He is alert. Mental status is at baseline.  Psychiatric:        Mood and Affect: Mood normal.        Behavior: Behavior normal.     ED Results / Procedures / Treatments   Labs (all labs ordered are listed, but only abnormal results are displayed) Labs Reviewed  BASIC METABOLIC PANEL - Abnormal; Notable for the following components:      Result Value   Potassium 2.9 (*)    Glucose, Bld 122 (*)    Creatinine, Ser 1.42 (*)    Calcium 8.4 (*)    GFR, Estimated 57 (*)    All other components within normal limits  CBC - Abnormal; Notable for the following components:   WBC 11.4 (*)    All other components within normal limits  CBG MONITORING, ED - Abnormal; Notable for the following components:   Glucose-Capillary 145 (*)    All other components within normal limits  TROPONIN I (HIGH SENSITIVITY)  TROPONIN I (HIGH SENSITIVITY)    EKG EKG Interpretation  Date/Time:  Saturday June 09 2022 19:41:01 EDT Ventricular Rate:  72 PR Interval:  149 QRS Duration: 120 QT Interval:  397 QTC Calculation: 435 R Axis:   32 Text Interpretation: Sinus rhythm Nonspecific intraventricular conduction delay Confirmed by Alvester Chou [phone]) on 06/09/2022 7:44:37 PM  Radiology DG Chest 2 View  Result Date: 06/09/2022 CLINICAL DATA:  Shortness of breath. EXAM: CHEST - 2 VIEW COMPARISON:  Chest x-ray 01/15/2022 FINDINGS: The heart size and mediastinal contours are within normal limits. Both lungs are clear. There is a healed left sixth rib fracture. IMPRESSION: No active cardiopulmonary  disease. Electronically Signed   By: Darliss Cheney M.D.   On: 06/09/2022 19:22    Procedures Procedures    Medications Ordered in ED Medications - No data to display  ED Course/ Medical Decision Making/ A&P Clinical Course as of 06/09/22 2335  Sat Jun 09, 2022  2225 Pain free on reassessment [MT]  2239 I spoke to oncall cardiologist vs [facility] who agreed that if the patient is pain-free, they could arrange for close outpatient follow-up this week in the office and stress testing.  The patient preferred to go  home.  His blood pressure did drop while he was sleeping, we will reassess [MT]  2310 Suspect patient's hypotension is likely related to his sleeping, improved when he woke up and was talking to me. [MT]  2310 You prefer to go home, okay for discharge at this time.  Will prescribe potassium for [MT]    Clinical Course User Index [MT] Oliviya Gilkison, Kermit Balo, MD                           Medical Decision Making Amount and/or Complexity of Data Reviewed Labs: ordered. Radiology: ordered.  Risk Prescription drug management.   This patient presents to the ED with concern for neck pain and chest discomfort. This involves an extensive number of treatment options, and is a complaint that carries with it a high risk of complications and morbidity.  The differential diagnosis includes ACS versus pneumonia versus pneumothorax versus reflux versus other  Co-morbidities that complicate the patient evaluation: History of severe significant coronary disease at high risk for acute coronary syndrome.  Additional history obtained from patient's wife at bedside  External records from outside source obtained and reviewed including left heart catheterization report from April of this year  I ordered and personally interpreted labs.  The pertinent results include: Potassium 2.9, delta troponins negative.  I ordered imaging studies including x-ray of the chest I independently visualized and  interpreted imaging which showed no focal abnormalities I agree with the radiologist interpretation  The patient was maintained on a cardiac monitor.  I personally viewed and interpreted the cardiac monitored which showed an underlying rhythm of: Normal sinus rhythm  Per my interpretation the patient's ECG shows normal sinus rhythm no acute ischemic findings  The patient is having minimal to no discomfort at this time and did not require nitroglycerin.  He has already taken full dose aspirin at home.   Test Considered: Lower suspicion for PE in this clinical context.  After the interventions noted above, I reevaluated the patient and found that they have: improved   Dispostion:  After consideration of the diagnostic results and the patients response to treatment, I feel that the patent would benefit from close outpatient follow-up.         Final Clinical Impression(s) / ED Diagnoses Final diagnoses:  Neck pain  Atypical chest pain  Hypokalemia    Rx / DC Orders ED Discharge Orders          Ordered    potassium chloride (KLOR-CON) 10 MEQ tablet  Daily        06/09/22 2311    metoprolol succinate (TOPROL-XL) 25 MG 24 hr tablet  Daily at bedtime        06/09/22 2311              Terald Sleeper, MD 06/09/22 [phone]

## 2022-06-09 NOTE — ED Notes (Signed)
Pt ambulated under observation from hallway 6 to hallway 4 and back to room. Pt ambulated with a steady gait and no assistance. Pt states feeling lightheaded at the beginning of their ambulation but feeling fine the rest of the time.

## 2022-06-09 NOTE — ED Triage Notes (Signed)
Pt brought in by EMS after he called out for neck pain. Wanted to be evaluated because hx of MI's and per pt, all started with symptoms of neck pain. Pt denies pain at this time. Pt also took 324mg  ASA prior to EMS arrival.

## 2022-06-09 NOTE — Discharge Instructions (Signed)
Please note that your blood test that showed your potassium level was mildly low.  I would recommend that you start taking potassium for the next 30 days and have your doctor recheck your level in 1 week.  The cardiology office should reach out to you early next week to schedule follow-up.  If you do not hear from them by the afternoon Monday, please call your cardiology office and request a follow-up appointment.  Please note that we also talked about your lower blood pressure today.  I recommend that you cut your metoprolol dose in half from 25 mg to 12.5 mg.  Keep a daily diary of your blood pressures and take this to the cardiologist with you to talk about your blood pressure at home.  If your chest pain returns, worsens, intensifies, or you have shortness of breath or lightheadedness or feel like passing out, call 911 return to the ER.

## 2022-06-19 LAB — LAB REPORT - SCANNED
A1c: 6.5
EGFR: 77.3

## 2022-07-19 ENCOUNTER — Ambulatory Visit: Payer: Medicaid Other | Admitting: Internal Medicine

## 2022-07-19 ENCOUNTER — Encounter: Payer: Self-pay | Admitting: Internal Medicine

## 2022-07-19 DIAGNOSIS — R0609 Other forms of dyspnea: Secondary | ICD-10-CM

## 2022-07-19 DIAGNOSIS — J449 Chronic obstructive pulmonary disease, unspecified: Secondary | ICD-10-CM

## 2022-07-19 NOTE — Patient Instructions (Addendum)
Ok to try albuterol 15 min before an activity (on alternating days with neb vs inhaler vs nothing )  that you know would usually make you short of breath and see if it makes any difference and if makes none then don't take albuterol after activity unless you can't catch your breath as this means it's the resting that helps, not the albuterol.  To get the most out of exercise, you need to be continuously aware that you are short of breath, but never out of breath, for at least 20 minutes daily. As you improve, it will actually be easier for you to do the same amount of exercise  in  20 minutes so always push to the level where you are slightly short of breath.  Once you can do this, push to 30 minutes are longer     Please remember to go to the lab department   for your tests - we will call you with the results when they are available.  Please schedule a follow up office visit in 4 weeks, sooner if needed

## 2022-07-19 NOTE — Progress Notes (Addendum)
James Huynh, male    DOB: December 13, 1960   MRN: [id number]   Brief patient profile:  61  yowm  MM/Quit smoking (and inhaling) cigars  01/2021 p already stopped cigs in 1992 due to  dx of  asthma rx albuterol and more albuterol up to 4 inhalers  per month and much better after a week of prednisone referred to pulmonary clinic in [facility]  02/13/2021 by [name], PA   Born at [facility] with "need for trach before age [age]" but "parents refused" then subsequenly  no problem age 43 then episodic  facial swelling  and difficulty breathing eval by "[facility]"  allergist in [place] > allergic to everything"  took shots until age 61 still problems in spring mostly rhinitis not asthma until 1992.       History of Present Illness  02/13/2021  Pulmonary/ 1st office eval/ Ronnett Pullin / [place] [facility]  Chief Complaint  Patient presents with   Pulmonary Consult    Referred by [name], PA. Pt c/o SOB for the past 2 years worse over the past 2 wks. Pt states that he always feels like he is unable to take a deep enough breath, feels better when he yawns. He is using his albuterol several times per day.   Dyspnea: limited by R radicular back pain > sob  Cough: sporadic /no rhinitis at initial eval or noct cough  Sleep: no problem noct lying flat SABA use: way too much  rec Plan A = Automatic = Always=    Symbicort 160 Take 2 puffs first thing in am and then another 2 puffs about 12 hours later.  Work on inhaler technique:    Plan B = Backup (to supplement plan A, not to replace it) Only use your albuterol inhaler as a rescue medication Plan C = Crisis (instead of Plan B but only if Plan B stops working) - only use your albuterol nebulizer if you first try Plan B  Omeprazole should be 40 mg  Take 30-60 min before first meal of the day  GERD diet/ lifestyle rx    08/14/2021  Labs: Allergy profile  Eos 0.1/ IgE 103   alpha one AT phenotype  MM level 141    02/19/2022  f/u ov/[facility]  office/[facility] re: GOLD 2 vs ACOS maint on breztri  Chief Complaint  Patient presents with   Follow-up    Cough has improved but SOB is still present. Patient states he has gained 20 pounds. Had a heart attack in April 2023.   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   Plan B = Backup (to supplement plan A, not to replace it) Only use your albuterol inhaler as a rescue medication Plan C = Crisis (instead of Plan B but only if Plan B stops working) - only use your albuterol nebulizer if you first try Plan B   To get the most out of exercise, you need to be continuously  aware that you are short of breath, but never out of breath, for at least 30 minutes daily.  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     Also denies any obvious fluctuation of symptoms with weather or environmental changes or other aggravating or alleviating factors except as outlined above   No unusual exposure hx.  Current Allergies, Complete Past Medical History, Past Surgical History, Family History, and Social History were reviewed in [employer] record.  ROS  The following are not active complaints unless bolded Hoarseness, sore throat, dysphagia, dental problems, itching, sneezing,  nasal congestion or discharge of excess mucus or purulent secretions, ear ache,   fever, chills, sweats, unintended wt loss or wt gain, classically pleuritic or exertional cp,  orthopnea pnd or arm/hand swelling  or leg swelling, presyncope, palpitations, abdominal pain, anorexia, nausea, vomiting, diarrhea  or change in bowel habits or change in bladder habits, change in stools or change in urine, dysuria, hematuria,  rash, arthralgias, visual complaints, headache, numbness, weakness or ataxia or problems with walking or coordination,  change in mood or  memory.        Current Meds  Medication Sig   albuterol (PROVENTIL) (2.5 MG/3ML) 0.083% nebulizer solution Take 2.5 mg by nebulization every 6 (six) hours as needed for wheezing or shortness of breath.   albuterol (VENTOLIN HFA) 108 (90 Base) MCG/ACT inhaler Inhale 2 puffs into the lungs every 6 (six) hours as needed for wheezing or shortness of breath.   aspirin EC 81 MG tablet Take 81 mg by mouth every evening.   Budeson-Glycopyrrol-Formoterol (BREZTRI AEROSPHERE) 160-9-4.8 MCG/ACT AERO Inhale 2 puffs into the lungs 2 (two) times daily.   Cholecalciferol (VITAMIN D3 PO) Take 1 tablet by mouth in the morning.   ezetimibe (ZETIA) 10 MG tablet Take 1 tablet by mouth once daily   furosemide (LASIX) 40 MG tablet Take 1 tablet (40 mg total) by mouth daily.   levothyroxine (SYNTHROID) 150 MCG tablet Take 150 mcg by mouth daily.   metoprolol succinate (TOPROL-XL) 25 MG 24  hr tablet Take 0.5 tablets (12.5 mg total) by mouth at bedtime.   nitroGLYCERIN (NITROSTAT) 0.4 MG SL tablet PLACE 1 TABLET UNDER THE TONGUE EVERY 5 MIN UP TO 3 TIMES AS NEEDED FOR CHEST PAIN   omeprazole (PRILOSEC) 40 MG capsule TAKE ONE CAPSULE BY MOUTH ONCE DAILY   rosuvastatin (CRESTOR) 40 MG tablet Take 1 tablet (40 mg total) by mouth daily. (Patient taking differently: Take 40 mg  by mouth at bedtime.)   ticagrelor (BRILINTA) 90 MG TABS tablet Take 1 tablet (90 mg total) by mouth 2 (two) times daily.                  Past Medical History:  Diagnosis Date   Asthma    CAD (coronary artery disease)    cath 09/09/2015 20% prox RCA, 80% prox to mid LCx treated with DES (3.518 mm Xience Alpine DES), 80% mid LAD lesion treated with 3.518 mm Xience Alpine DES stent postdilated to 4.2.   Chest pain    a. negative stress echo August 2011   GERD (gastroesophageal reflux disease)    High cholesterol    History of pneumonia    a. 2014.   Hypothyroidism    Morbid obesity (HCC)    Tobacco abuse       Objective:     Wts  07/19/2022       250  02/19/2022         248  08/14/2021     230   03/17/21 234 lb 9.6 oz (106.4 kg)  02/13/21 234 lb (106.1 kg)  01/17/21 236 lb 3.2 oz (107.1 kg)     Vital signs reviewed  07/19/2022  - Note at rest 02 sats  96% on RA   General appearance:    obese wm slt gruff voice amb with pointed boots     HEENT : Oropharynx  clear    NECK :  without  apparent JVD/ palpable Nodes/TM    LUNGS: no acc muscle use,  Mild barrel  contour chest wall with bilateral  Distant bs s audible wheeze and  without cough on insp or exp maneuvers  and mild  Hyperresonant  to  percussion bilaterally     CV:  RRR  no s3 or murmur or increase in P2, and no edema   ABD:  soft and nontender with pos end  insp Hoover's  in the supine position.  No bruits or organomegaly appreciated   MS:  Nl gait/ ext warm without deformities Or obvious joint restrictions  calf  tenderness, cyanosis or clubbing     SKIN: warm and dry without lesions    NEURO:  alert, approp, nl sensorium with  no motor or cerebellar deficits apparent.           I personally reviewed images and agree with radiology impression as follows:  CXR:   06/09/22 No active cardiopulmonary disease.    Labs ordered/ reviewed:      Chemistry      Component Value Date/Time   NA 138 07/19/2022 1358   K 4.1 07/19/2022 1358   CL 98 07/19/2022 1358   CO2 26 07/19/2022 1358   BUN 10 07/19/2022 1358   CREATININE 1.33 (H) 07/19/2022 1358      Component Value Date/Time   CALCIUM 9.1 07/19/2022 1358   ALKPHOS 75 01/18/2013 1307   AST 20 01/18/2013 1307   ALT 30 01/18/2013 1307   BILITOT 0.3 01/18/2013 1307        Lab Results  Component Value Date   WBC 10.6 07/19/2022   HGB 14.6 07/19/2022   HCT 43.3 07/19/2022   MCV 90 07/19/2022   PLT 240 07/19/2022       EOS  0.1                                     07/19/2022      Lab Results  Component Value Date   TSH 0.456 07/19/2022      BNP     07/19/2022   31.5           Assessment

## 2022-07-20 ENCOUNTER — Encounter: Payer: Self-pay | Admitting: Internal Medicine

## 2022-07-20 LAB — BASIC METABOLIC PANEL
BUN/Creatinine Ratio: 8 — ABNORMAL LOW (ref 10–24)
BUN: 10 mg/dL (ref 8–27)
CO2: 26 mmol/L (ref 20–29)
Calcium: 9.1 mg/dL (ref 8.6–10.2)
Chloride: 98 mmol/L (ref 96–106)
Creatinine, Ser: 1.33 mg/dL — ABNORMAL HIGH (ref 0.76–1.27)
Glucose: 91 mg/dL (ref 70–99)
Potassium: 4.1 mmol/L (ref 3.5–5.2)
Sodium: 138 mmol/L (ref 134–144)
eGFR: 61 mL/min/{1.73_m2} (ref >59)

## 2022-07-20 LAB — CBC WITH DIFFERENTIAL/PLATELET
Basophils Absolute: 0.1 10*3/uL (ref 0.0–0.2)
Basos: 1 %
EOS (ABSOLUTE): 0.1 10*3/uL (ref 0.0–0.4)
Eos: 1 %
Hematocrit: 43.3 % (ref 37.5–51.0)
Hemoglobin: 14.6 g/dL (ref 13.0–17.7)
Immature Grans (Abs): 0 10*3/uL (ref 0.0–0.1)
Immature Granulocytes: 0 %
Lymphocytes Absolute: 3.9 10*3/uL — ABNORMAL HIGH (ref 0.7–3.1)
Lymphs: 37 %
MCH: 30.4 pg (ref 26.6–33.0)
MCHC: 33.7 g/dL (ref 31.5–35.7)
MCV: 90 fL (ref 79–97)
Monocytes Absolute: 1.1 10*3/uL — ABNORMAL HIGH (ref 0.1–0.9)
Monocytes: 10 %
Neutrophils Absolute: 5.4 10*3/uL (ref 1.4–7.0)
Neutrophils: 51 %
Platelets: 240 10*3/uL (ref 150–450)
RBC: 4.8 x10E6/uL (ref 4.14–5.80)
RDW: 13.6 % (ref 11.6–15.4)
WBC: 10.6 10*3/uL (ref 3.4–10.8)

## 2022-07-20 LAB — BRAIN NATRIURETIC PEPTIDE: BNP: 31.5 pg/mL (ref 0.0–100.0)

## 2022-07-20 LAB — TSH: TSH: 0.456 u[IU]/mL (ref 0.450–4.500)

## 2022-07-20 NOTE — Assessment & Plan Note (Addendum)
Quit smoking 01/2021 -  H/o childhood allergies "to everything" on allergy shots age 61 -43  - PFT's 05/08/18  FEV1 2.44 (65 % ) ratio 0.58  p 7 % improvement from saba p ? saba hfa and neb prior to study with DLCO  21.84 (67%) corrects to 3.55 (76%)  for alv volume and FV curve classic mild/mod concavity plus  ERV 35% at wt 231  - 02/13/2021  After extensive coaching inhaler device,  effectiveness =    75% (short ti) > try symbicort 160 2bid and if fails > change to breztri  - 03/17/2021  After extensive coaching inhaler device,  effectiveness =  75%> try   Breztri  2 bid and if not happy with benefits vs cost then back to stiolto 2 pffs each am  -  03/17/2021   Walked RA  approx   300 ft  @ moderate pace  stopped due end of study with sats 98%    - Labs ordered 08/14/2021  :  allergy profile  Eos 0.1/ IgE 103   alpha one AT phenotype  MM level 141  - 08/14/2021   Walked on RA x  3  lap(s) =  approx 477ft @ mod to fast pace, stopped due to end of study, mild sob  with lowest 02 sats 95% at lowest   - 02/19/2022  After extensive coaching inhaler device,  effectiveness =    90% > continue breztri and more approp saba - 07/19/2022   Walked on RA  x  3  lap(s) =  approx 450  ft  @ mod pace, stopped due to end of study  with lowest 02 sats 95% s sob   Group D (now reclassified as E) in terms of symptom/risk and laba/lama/ICS  therefore appropriate rx at this point >>>  breztri and more approp saba  Re SABA :  I spent extra time with pt today reviewing appropriate use of albuterol for prn use on exertion with the following points: 1) saba is for relief of sob that does not improve by walking a slower pace or resting but rather if the pt does not improve after trying this first. 2) If the pt is convinced, as many are, that saba helps recover from activity faster then it's easy to tell if this is the case by re-challenging : ie stop, take the inhaler, then p 5 minutes try the exact same activity (intensity of workload)  that just caused the symptoms and see if they are substantially diminished or not after saba 3) if there is an activity that reproducibly causes the symptoms, try the saba 15 min before the activity on alternate days   If in fact the saba really does help, then fine to continue to use it prn but advised may need to look closer at the maintenance regimen being used to achieve better control of airways disease with exertion.

## 2022-07-22 ENCOUNTER — Encounter: Payer: Self-pay | Admitting: Internal Medicine

## 2022-07-22 NOTE — Assessment & Plan Note (Signed)
Symptoms are markedly disproportionate to objective findings and not clear to what extent this is actually a pulmonary  problem but pt does appear to have difficult to sort out respiratory symptoms of unknown origin for which  DDX  = almost all start with A and  include Adherence, Ace Inhibitors, Acid Reflux, Active Sinus Disease, Alpha 1 Antitripsin deficiency, Anxiety masquerading as Airways dz,  ABPA,  Allergy(esp in young), Aspiration (esp in elderly), Adverse effects of meds,  Active smoking or Vaping, A bunch of PE's/clot burden (a few small clots can't cause this syndrome unless there is already severe underlying pulm or vascular dz with poor reserve),  Anemia or thyroid disorder, plus two Bs  = Bronchiectasis and Beta blocker use..and one C= CHF    ? Acid (or non-acid) GERD > always difficult to exclude as up to 75% of pts in some series report no assoc GI/ Heartburn symptoms> rec continue max (24h)  acid suppression and diet restrictions/ reviewed      No evidence of allergy/ unaddressed asthma component/ anemia/ thyroid dz or chf by labs   Each maintenance medication was reviewed in detail including emphasizing most importantly the difference between maintenance and prns and under what circumstances the prns are to be triggered using an action plan format where appropriate.  Total time for H and P, chart review, counseling, reviewing hfa/neb device(s) , directly observing portions of ambulatory 02 saturation study/ and generating customized AVS unique to this office visit / same day charting = 32 min

## 2022-08-04 ENCOUNTER — Other Ambulatory Visit: Payer: Self-pay | Admitting: Internal Medicine

## 2022-08-24 ENCOUNTER — Ambulatory Visit: Payer: Medicaid Other | Admitting: Internal Medicine

## 2022-08-24 ENCOUNTER — Encounter: Payer: Self-pay | Admitting: Internal Medicine

## 2022-08-24 VITALS — BP 138/86 | HR 98 | Temp 98.2°F | Ht 72.0 in | Wt 251.0 lb

## 2022-08-24 DIAGNOSIS — Z87891 Personal history of nicotine dependence: Secondary | ICD-10-CM | POA: Diagnosis not present

## 2022-08-24 DIAGNOSIS — J449 Chronic obstructive pulmonary disease, unspecified: Secondary | ICD-10-CM | POA: Diagnosis not present

## 2022-08-24 NOTE — Patient Instructions (Signed)
No change in recommendations   Only use your albuterol as a rescue medication to be used if you can't catch your breath by resting or doing a relaxed purse lip breathing pattern.  - The less you use it, the better it will work when you need it. - Ok to use up to 2 puffs  every 4 hours if you must but call for immediate appointment if use goes up over your usual need - Don't leave home without it !!  (think of it like the spare tire for your car)   Also  Ok to try albuterol 15 min before an activity (on alternating days)  that you know would usually make you short of breath and see if it makes any difference and if makes none then don't take albuterol after activity unless you can't catch your breath as this means it's the resting that helps, not the albuterol.       Please schedule a follow up visit in 6  months but call sooner if needed

## 2022-08-24 NOTE — Progress Notes (Unsigned)
James Huynh, male    DOB: 07/11/1961   MRN: [id number]   Brief patient profile:  61  yowm  MM/Quit smoking (and inhaling) cigars  01/2021 p already stopped cigs in 1992 due to  dx of  asthma rx albuterol and more albuterol up to 4 inhalers  per month and much better after a week of prednisone referred to pulmonary clinic in [facility]  02/13/2021 by James Huynh   Born at [facility] with "need for trach before age [age]" but "parents refused" then subsequenly  no problem age 22 then episodic  facial swelling  and difficulty breathing eval by "[facility]"  allergist in [place] > allergic to everything"  took shots until age 54 still problems in spring mostly rhinitis not asthma until 1992.       History of Present Illness  02/13/2021  Pulmonary/ 1st office eval/ James Huynh / [place] [facility]  Chief Complaint  Patient presents with   Pulmonary Consult    Referred by James Huynh. Pt c/o SOB for the past 2 years worse over the past 2 wks. Pt states that he always feels like he is unable to take a deep enough breath, feels better when he yawns. He is using his albuterol several times per day.   Dyspnea: limited by R radicular back pain > sob  Cough: sporadic /no rhinitis at initial eval or noct cough  Sleep: no problem noct lying flat SABA use: way too much  rec Plan A = Automatic = Always=    Symbicort 160 Take 2 puffs first thing in am and then another 2 puffs about 12 hours later.  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 Patient states he has gained 20 pounds. Had a heart attack in April 2023.   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   Plan B = Backup (to supplement plan A, not to replace it) Only use your albuterol inhaler as a rescue medication Plan C = Crisis (instead of Plan B but only if Plan B stops working) - only use your albuterol nebulizer if you first try Plan B   To get the most out of exercise, you need to be continuously  aware that you are short of breath, but never out of breath, for at least 30 minutes daily.  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 pace on a flat grade s sob but does fine slow and flat at [employer]  Cough: sensation of something stuck in throat / min mucus no am flares  Sleeping: flat bed/ one pillow/ some wheeze sleeping with dog and has been told he is allergic s/p remote allergy shots helped  SABA use: last used 6 h prior to OV   02: none  Covid status:   never  Lung cancer screening : in program  Rec Ok to try albuterol 15 min before an activity (on alternating days with neb vs inhaler vs nothing )  that you know would usually make you short of breath   To get the most out of exercise, you need to be continuously aware that you are short of breath, but never out of breath, for at least 20 minutes daily      08/24/2022  f/u ov/[place] office/James Huynh re: GOLD 2 COPD  maint on breztri  / still over using saba  Chief Complaint  Patient presents with   Follow-up    Having bad back pain  Breathing is about the same since last ov   Dyspnea:  Not limited by breathing from desired activities   Cough: in am clear mostly  Sleeping: flat bed/ dog in bedroom  no wheeze  SABA use: down to 1.5 inhalers per month/ rarely neb  02: none  Covid status: none  infected x one  Lung cancer screening: in program    No obvious day to day or daytime variability or assoc excess/ purulent sputum or mucus plugs or hemoptysis or cp or chest tightness, subjective wheeze or overt sinus or hb symptoms.   Sleeping as above without nocturnal  or early am exacerbation  of respiratory  c/o's or need for noct saba. Also denies any obvious fluctuation of symptoms with weather or environmental changes or other aggravating or alleviating factors except as outlined above   No unusual exposure hx or h/o childhood pna/ asthma or knowledge of premature birth.  Current Allergies, Complete Past Medical History, Past Surgical History, Family History, and Social History were reviewed in [employer] record.  ROS  The following are not active complaints unless bolded Hoarseness, sore throat, dysphagia, dental problems, itching, sneezing,  nasal congestion or discharge of excess mucus or purulent secretions, ear ache,   fever, chills, sweats, unintended wt loss or wt gain, classically pleuritic or exertional cp,  orthopnea pnd or arm/hand swelling  or leg swelling, presyncope, palpitations, abdominal pain, anorexia, nausea, vomiting, diarrhea  or change in bowel habits or change in bladder habits, change in stools or change in urine, dysuria, hematuria,  rash,  arthralgias/ severe back pain, visual complaints, headache, numbness, weakness or ataxia or problems with walking or coordination,  change in mood or  memory.        Current Meds  Medication Sig   albuterol (PROVENTIL) (2.5 MG/3ML) 0.083% nebulizer solution Take 2.5 mg by nebulization every 6 (six) hours as needed for wheezing or  shortness of breath.   albuterol (VENTOLIN HFA) 108 (90 Base) MCG/ACT inhaler Inhale 2 puffs into the lungs every 6 (six) hours as needed for wheezing or shortness of breath.   aspirin EC 81 MG tablet Take 81 mg by mouth every evening.   Budeson-Glycopyrrol-Formoterol (BREZTRI AEROSPHERE) 160-9-4.8 MCG/ACT AERO Inhale 2 puffs into the lungs 2 (two) times daily.   Cholecalciferol (VITAMIN D3 PO) Take 1 tablet by mouth in the morning.   ezetimibe (ZETIA) 10 MG tablet Take 1 tablet by mouth once daily   furosemide (LASIX) 40 MG tablet Take 1 tablet (40 mg total) by mouth daily.   levothyroxine (SYNTHROID) 150 MCG tablet Take 150 mcg by mouth daily.   metoprolol succinate (TOPROL-XL) 25 MG 24 hr tablet Take 0.5 tablets (12.5 mg total) by mouth at bedtime.   nitroGLYCERIN (NITROSTAT) 0.4 MG SL tablet PLACE 1 TABLET UNDER THE TONGUE EVERY 5 MIN UP TO 3 TIMES AS NEEDED FOR CHEST PAIN   omeprazole (PRILOSEC) 40 MG capsule TAKE ONE CAPSULE BY MOUTH ONCE DAILY   rosuvastatin (CRESTOR) 40 MG tablet Take 1 tablet (40 mg total) by mouth daily. (Patient taking differently: Take 40 mg by mouth at bedtime.)   ticagrelor (BRILINTA) 90 MG TABS tablet Take 1 tablet (90 mg total) by mouth 2 (two) times daily.               Past Medical History:  Diagnosis Date   Asthma    CAD (coronary artery disease)    cath 09/09/2015 20% prox RCA, 80% prox to mid LCx treated with DES (3.518 mm Xience Alpine DES), 80% mid LAD lesion treated with 3.518 mm Xience Alpine DES stent postdilated to 4.2.   Chest pain    a. negative stress echo August 2011   GERD (gastroesophageal reflux disease)     High cholesterol    History of pneumonia    a. 2014.   Hypothyroidism    Morbid obesity (HCC)    Tobacco abuse       Objective:     Wts  08/24/2022     251  07/19/2022       250  02/19/2022         248  08/14/2021     230   03/17/21 234 lb 9.6 oz (106.4 kg)  02/13/21 234 lb (106.1 kg)  01/17/21 236 lb 3.2 oz (107.1 kg)    Vital signs reviewed  08/24/2022  - Note at rest 02 sats  98% on RA   General appearance:    amb obese wm slt gruff voice / grimacing in pain with movement  (back)   HEENT : Oropharynx  clear   Nasal turbinates nl    NECK :  without  apparent JVD/ palpable Nodes/TM    LUNGS: no acc muscle use,  Mild barrel  contour chest wall with bilateral  Distant bs s audible wheeze and  without cough on insp or exp maneuvers  and mild  Hyperresonant  to  percussion bilaterally     CV:  RRR  no s3 or murmur or increase in P2, and no significant  edema   ABD:  soft and nontender with pos end  insp Hoover's  in the supine position.  No bruits or organomegaly appreciated   MS:   ext warm without deformities Or obvious joint restrictions  calf tenderness, cyanosis or clubbing     SKIN: warm and dry without lesions    NEURO:  alert, approp, nl sensorium  with  no motor or cerebellar deficits apparent.                Assessment

## 2022-08-25 ENCOUNTER — Encounter: Payer: Self-pay | Admitting: Internal Medicine

## 2022-08-25 NOTE — Assessment & Plan Note (Signed)
Quit smoking 01/2021 -  H/o childhood allergies "to everything" on allergy shots age 61 -31  - PFT's 05/08/18  FEV1 2.44 (65 % ) ratio 0.58  p 7 % improvement from saba p ? saba hfa and neb prior to study with DLCO  21.84 (67%) corrects to 3.55 (76%)  for alv volume and FV curve classic mild/mod concavity plus  ERV 35% at wt 231  - 02/13/2021  After extensive coaching inhaler device,  effectiveness =    75% (short ti) > try symbicort 160 2bid and if fails > change to breztri  - 03/17/2021  After extensive coaching inhaler device,  effectiveness =  75%> try   Breztri  2 bid and if not happy with benefits vs cost then back to stiolto 2 pffs each am  -  03/17/2021   Walked RA  approx   300 ft  @ moderate pace  stopped due end of study with sats 98%    - Labs ordered 08/14/2021  :  allergy profile  Eos 0.1/ IgE 103   alpha one AT phenotype  MM level 141  - 08/14/2021   Walked on RA x  3  lap(s) =  approx 458ft @ mod to fast pace, stopped due to end of study, mild sob  with lowest 02 sats 95% at lowest   - 02/19/2022  After extensive coaching inhaler device,  effectiveness =    90% > continue breztri and more approp saba - 07/19/2022   Walked on RA  x  3  lap(s) =  approx 450  ft  @ mod pace, stopped due to end of study  with lowest 02 sats 95% s sob  - 08/24/2022  After extensive coaching inhaler device,  effectiveness =    90%    Group D (now reclassified as E) in terms of symptom/risk and laba/lama/ICS  therefore appropriate rx at this point >>>  breztri but needs to use saba more appropriately   Re SABA :  I spent extra time with pt today reviewing appropriate use of albuterol for prn use on exertion with the following points: 1) saba is for relief of sob that does not improve by walking a slower pace or resting but rather if the pt does not improve after trying this first. 2) If the pt is convinced, as many are, that saba helps recover from activity faster then it's easy to tell if this is the case by  re-challenging : ie stop, take the inhaler, then p 5 minutes try the exact same activity (intensity of workload) that just caused the symptoms and see if they are substantially diminished or not after saba 3) if there is an activity that reproducibly causes the symptoms, try the saba 15 min before the activity on alternate days   If in fact the saba really does help, then fine to continue to use it prn but advised may need to look closer at the maintenance regimen being used to achieve better control of airways disease with exertion.

## 2022-08-25 NOTE — Assessment & Plan Note (Addendum)
Quit 01/2021 so eligible for annual screening thru 2037   >>>   Advised to keep up with annual screening as above     Each maintenance medication was reviewed in detail including emphasizing most importantly the difference between maintenance and prns and under what circumstances the prns are to be triggered using an action plan format where appropriate.  Total time for H and P, chart review, counseling, reviewing hfa/neb  device(s) and generating customized AVS unique to this office visit / same day charting = 24 min

## 2022-08-31 ENCOUNTER — Encounter: Payer: Self-pay | Admitting: Internal Medicine

## 2022-08-31 ENCOUNTER — Encounter: Payer: Self-pay | Admitting: Cardiology

## 2022-08-31 NOTE — Telephone Encounter (Signed)
This is very common in men with severe cough and can result in serious injury so rec re-eval and unfortunately I'm here all day - would rec UC or ER but can't call in anything for it at this point.

## 2022-08-31 NOTE — Telephone Encounter (Signed)
After leaving your facilities a week ago today last Friday I started getting sick I know it was from the wind outside cuz I didn't have the proper jacket on it went to my lungs and my throat Saturday and Sunday I had to sit up sleeping every time I try to lay down I start coughing and my head off no color in my phlegm is all clear Monday I went to my PCP and he gave me a shot of prednisone and that night I was able to sleep normally antibiotic and Thursday I called my PCP let him know that that antibiotic is not working that good and I'm coughing so bad I can't sleep I am so tired he prescribed me hydrocodone in a cough medicine form and doxycycline I started that on Thursday yesterday Thursday I had a coughing spell that I passed out for a minute or so luckily enough I fell against the side of my desk but I have an abrasion on my shoulder and I broke the bottom part of my desk my wife said I started seizing. This is the second time that's happened it happened one time last year from coughing so hard that I passed out I don't remember passing out yesterday I just know I was straightening my legs out so she can sweep under my feet and I started coughing and I could feel it through my whole body. This scares me and because of the sickness with my lungs and the tightness and not being able to breathe good this week I've kind of abused my breeze tree inhaler a little bit I've used more than I should have used but in order to breathe. I have no way to get to the doctor todaY    Please advise Dr. [name]

## 2022-09-05 MED ORDER — OMEPRAZOLE 40 MG PO CPDR: 40.0000 mg | DELAYED_RELEASE_CAPSULE | Freq: Every day | ORAL | 2 refills | Status: DC

## 2022-09-05 NOTE — Addendum Note (Signed)
Addended by: Carleene Mains D on: 09/05/2022 10:29 AM   Modules accepted: Orders

## 2022-09-07 ENCOUNTER — Emergency Department (HOSPITAL_COMMUNITY): Payer: Medicaid Other

## 2022-09-07 ENCOUNTER — Other Ambulatory Visit: Payer: Self-pay

## 2022-09-07 ENCOUNTER — Emergency Department (HOSPITAL_COMMUNITY)
Admission: EM | Admit: 2022-09-07 | Discharge: 2022-09-07 | Disposition: A | Discharge status: Home / Self Care | Payer: Medicaid Other | Attending: Emergency Medicine | Admitting: Emergency Medicine

## 2022-09-07 DIAGNOSIS — Z7982 Long term (current) use of aspirin: Secondary | ICD-10-CM | POA: Insufficient documentation

## 2022-09-07 DIAGNOSIS — R600 Localized edema: Secondary | ICD-10-CM | POA: Insufficient documentation

## 2022-09-07 DIAGNOSIS — R0602 Shortness of breath: Secondary | ICD-10-CM | POA: Diagnosis present

## 2022-09-07 DIAGNOSIS — J449 Chronic obstructive pulmonary disease, unspecified: Secondary | ICD-10-CM | POA: Diagnosis not present

## 2022-09-07 DIAGNOSIS — Z7951 Long term (current) use of inhaled steroids: Secondary | ICD-10-CM | POA: Insufficient documentation

## 2022-09-07 DIAGNOSIS — J209 Acute bronchitis, unspecified: Secondary | ICD-10-CM | POA: Insufficient documentation

## 2022-09-07 MED ORDER — BENZONATATE 100 MG PO CAPS
200.0000 mg | ORAL_CAPSULE | Freq: Once | ORAL | Status: AC
Start: 2022-09-07 — End: 2022-09-07
  Administered 2022-09-07: 200 mg via ORAL
  Filled 2022-09-07: qty 2

## 2022-09-07 MED ORDER — KETOROLAC TROMETHAMINE 60 MG/2ML IM SOLN
60.0000 mg | Freq: Once | INTRAMUSCULAR | Status: AC
  Administered 2022-09-07: 60 mg via INTRAMUSCULAR
  Filled 2022-09-07: qty 2

## 2022-09-07 MED ORDER — ALBUTEROL SULFATE HFA 108 (90 BASE) MCG/ACT IN AERS: 2.0000 | INHALATION_SPRAY | RESPIRATORY_TRACT | Status: DC | PRN

## 2022-09-07 MED ORDER — MELOXICAM 7.5 MG PO TABS: 7.5000 mg | ORAL_TABLET | Freq: Two times a day (BID) | ORAL | 0 refills | Status: AC | PRN

## 2022-09-07 MED ORDER — BENZONATATE 100 MG PO CAPS: 100.0000 mg | ORAL_CAPSULE | Freq: Three times a day (TID) | ORAL | 0 refills | Status: DC

## 2022-09-07 NOTE — ED Provider Notes (Signed)
[facility] EMERGENCY DEPARTMENT Provider Note   CSN: [id number] Arrival date & time: 09/07/22  1521     History  Chief Complaint  Patient presents with   Shortness of Breath   Nasal Congestion    James Huynh is a 61 y.o. male.   Shortness of Breath This patient is a 61 year old male with a history of COPD on multiple breathing treatments in the past, he presents to the hospital with a complaint of chest pain on the right side which occurred with excessive amounts of coughing over the last couple of weeks.  He has been seen by his doctor multiple times and placed on 2 different antibiotics, currently finishing up doxycycline which has not really helped.  He continues to cough, clear phlegm, he has associated discomfort in the right chest when he breathes or coughs, it seems to get a lot worse when he has a bad coughing fit.  There is some swelling in the legs associated, he is already taking Lasix which does not seem to help primarily because he is sitting up to sleep at night because when he lays down the pain is worse.     Home Medications Prior to Admission medications   Medication Sig Start Date End Date Taking? Authorizing Provider  benzonatate (TESSALON) 100 MG capsule Take 1 capsule (100 mg total) by mouth every 8 (eight) hours. 09/07/22  Yes Eber Hong, MD  meloxicam (MOBIC) 7.5 MG tablet Take 1 tablet (7.5 mg total) by mouth 2 (two) times daily as needed for up to 14 days for pain. 09/07/22 09/21/22 Yes Eber Hong, MD  albuterol (PROVENTIL) (2.5 MG/3ML) 0.083% nebulizer solution Take 2.5 mg by nebulization every 6 (six) hours as needed for wheezing or shortness of breath.    [provider]  albuterol (VENTOLIN HFA) 108 (90 Base) MCG/ACT inhaler Inhale 2 puffs into the lungs every 6 (six) hours as needed for wheezing or shortness of breath.    [provider]  aspirin EC 81 MG tablet Take 81 mg by mouth every evening.    [provider]   Budeson-Glycopyrrol-Formoterol (BREZTRI AEROSPHERE) 160-9-4.8 MCG/ACT AERO Inhale 2 puffs into the lungs 2 (two) times daily. 03/17/21   Nyoka Cowden, MD  Cholecalciferol (VITAMIN D3 PO) Take 1 tablet by mouth in the morning.    [provider]  ezetimibe (ZETIA) 10 MG tablet Take 1 tablet by mouth once daily 04/25/22   Rollene Rotunda, MD  furosemide (LASIX) 40 MG tablet Take 1 tablet (40 mg total) by mouth daily. 05/23/22   Rollene Rotunda, MD  levothyroxine (SYNTHROID) 150 MCG tablet Take 150 mcg by mouth daily. 01/02/22   [provider]  metoprolol succinate (TOPROL-XL) 25 MG 24 hr tablet Take 0.5 tablets (12.5 mg total) by mouth at bedtime. 06/09/22   Terald Sleeper, MD  nitroGLYCERIN (NITROSTAT) 0.4 MG SL tablet PLACE 1 TABLET UNDER THE TONGUE EVERY 5 MIN UP TO 3 TIMES AS NEEDED FOR CHEST PAIN 01/18/22   Rollene Rotunda, MD  omeprazole (PRILOSEC) 40 MG capsule Take 1 capsule (40 mg total) by mouth daily. 09/05/22   Nyoka Cowden, MD  potassium chloride (KLOR-CON) 10 MEQ tablet Take 1 tablet (10 mEq total) by mouth daily for 30 doses. 06/09/22 07/09/22  Terald Sleeper, MD  rosuvastatin (CRESTOR) 40 MG tablet Take 1 tablet (40 mg total) by mouth daily. Patient taking differently: Take 40 mg by mouth at bedtime. 12/13/21   Rollene Rotunda, MD  ticagrelor Marden Noble)  90 MG TABS tablet Take 1 tablet (90 mg total) by mouth 2 (two) times daily. 01/18/22   Jonita Albee, PA-C      Allergies    Penicillins    Review of Systems   Review of Systems  Respiratory:  Positive for shortness of breath.   All other systems reviewed and are negative.   Physical Exam Updated Vital Signs BP 116/73 (BP Location: Left Arm)   Pulse 79   Temp 98.3 F (36.8 C) (Oral)   Resp 16   Ht 1.829 m (6')   Wt 113.9 kg   SpO2 97%   BMI 34.04 kg/m  Physical Exam Vitals and nursing note reviewed.  Constitutional:      General: He is not in acute distress.    Appearance: He is  well-developed.  HENT:     Head: Normocephalic and atraumatic.     Mouth/Throat:     Pharynx: No oropharyngeal exudate.  Eyes:     General: No scleral icterus.       Right eye: No discharge.        Left eye: No discharge.     Conjunctiva/sclera: Conjunctivae normal.     Pupils: Pupils are equal, round, and reactive to light.  Neck:     Thyroid: No thyromegaly.     Vascular: No JVD.  Cardiovascular:     Rate and Rhythm: Normal rate and regular rhythm.     Heart sounds: Normal heart sounds. No murmur heard.    No friction rub. No gallop.  Pulmonary:     Effort: Pulmonary effort is normal. No respiratory distress.     Breath sounds: Normal breath sounds. No wheezing or rales.  Chest:     Chest wall: Tenderness present.  Abdominal:     General: Bowel sounds are normal. There is no distension.     Palpations: Abdomen is soft. There is no mass.     Tenderness: There is no abdominal tenderness.  Musculoskeletal:        General: No tenderness. Normal range of motion.     Cervical back: Normal range of motion and neck supple.     Right lower leg: Edema present.     Left lower leg: Edema present.  Lymphadenopathy:     Cervical: No cervical adenopathy.  Skin:    General: Skin is warm and dry.     Findings: No erythema or rash.  Neurological:     Mental Status: He is alert.     Coordination: Coordination normal.  Psychiatric:        Behavior: Behavior normal.     ED Results / Procedures / Treatments   Labs (all labs ordered are listed, but only abnormal results are displayed) Labs Reviewed - No data to display  EKG EKG Interpretation  Date/Time:  Friday September 07 2022 16:11:52 EST Ventricular Rate:  75 PR Interval:  142 QRS Duration: 78 QT Interval:  386 QTC Calculation: 431 R Axis:   21 Text Interpretation: Normal sinus rhythm Normal ECG When compared with ECG of 09-Jun-2022 19:41, PREVIOUS ECG IS PRESENT Confirmed by Eber Hong ([id number]) on 09/07/2022 5:34:22  PM  Radiology DG Chest 2 View  Result Date: 09/07/2022 CLINICAL DATA:  Shortness of breath and cough EXAM: CHEST - 2 VIEW COMPARISON:  Chest x-ray dated June 09, 2022 FINDINGS: The heart size and mediastinal contours are within normal limits. Both lungs are clear. The visualized skeletal structures are unremarkable. IMPRESSION: No active cardiopulmonary disease. Electronically Signed  By: Allegra Lai M.D.   On: 09/07/2022 16:29    Procedures Procedures    Medications Ordered in ED Medications  albuterol (VENTOLIN HFA) 108 (90 Base) MCG/ACT inhaler 2 puff (has no administration in time range)  benzonatate (TESSALON) capsule 200 mg (has no administration in time range)  ketorolac (TORADOL) injection 60 mg (has no administration in time range)    ED Course/ Medical Decision Making/ A&P                           Medical Decision Making Amount and/or Complexity of Data Reviewed Radiology: ordered.  Risk Prescription drug management.   There is reproducible tenderness over the right lateral and posterior chest wall consistent with either fracture or muscular strain.  Thankfully the imaging shows that there is no signs of fractures pneumothorax or pneumonia.  His vital signs are totally normal.  He does have mild edema bilaterally which I expect is somewhat related to his positioning since he is in an upright position all day and all night.  He will continue to take Lasix, I will add a dose of intramuscular NSAID as well as benzonatate Perles.  He has already been given Hycodan by his family doctor, I do not feel comfortable prescribing more opiate type medication for this patient given his symptoms.  He has chronic COPD but has totally clear lungs and normal oxygenation.  I suspect he has a postinfectious bronchitis.  He has plenty of albuterol treatments at home to continue treatment and to follow-up with his family doctor and understands indications for return        Final  Clinical Impression(s) / ED Diagnoses Final diagnoses:  Acute bronchitis, unspecified organism    Rx / DC Orders ED Discharge Orders          Ordered    meloxicam (MOBIC) 7.5 MG tablet  2 times daily PRN        09/07/22 1804    benzonatate (TESSALON) 100 MG capsule  Every 8 hours        09/07/22 1804              Eber Hong, MD 09/07/22 [phone]

## 2022-09-07 NOTE — ED Triage Notes (Signed)
Pt c/o cough, congestion, and SOB x2 weeks.  Back pain 10/10 w/ coughing.  Productive cough x2 days.  Denies chest pain.  Hx of emphysema.  Pt has been seen by PCP and prescribed prednisone, doxycycline, and cough syrup.  No relief w/ medications.

## 2022-09-07 NOTE — Discharge Instructions (Addendum)
Continue to use your albuterol 2 puffs every 4 hours as needed for shortness of breath or coughing.  I have prescribed a medication called benzonatate which is a cough medication that can be used every 8 hours as needed for coughing.  You should follow-up with your doctor on Monday for a recheck, ER for severe worsening symptoms.  Thank you for allowing [place] to treat you in the emergency department today.  After reviewing your examination and potential testing that was done it appears that you are safe to go home.  I would like for you to follow-up with your doctor within the next several days, have them obtain your results and follow-up with them to review all of these tests.  If you should develop severe or worsening symptoms return to the emergency department immediately

## 2022-09-12 ENCOUNTER — Emergency Department (HOSPITAL_COMMUNITY)
Admission: EM | Admit: 2022-09-12 | Discharge: 2022-09-12 | Disposition: A | Discharge status: Home / Self Care | Payer: Medicaid Other | Attending: Emergency Medicine | Admitting: Emergency Medicine

## 2022-09-12 ENCOUNTER — Encounter (HOSPITAL_COMMUNITY): Payer: Self-pay | Admitting: Emergency Medicine

## 2022-09-12 ENCOUNTER — Other Ambulatory Visit: Payer: Self-pay

## 2022-09-12 DIAGNOSIS — Z7982 Long term (current) use of aspirin: Secondary | ICD-10-CM | POA: Diagnosis not present

## 2022-09-12 DIAGNOSIS — J45909 Unspecified asthma, uncomplicated: Secondary | ICD-10-CM | POA: Insufficient documentation

## 2022-09-12 DIAGNOSIS — W5501XA Bitten by cat, initial encounter: Secondary | ICD-10-CM | POA: Diagnosis not present

## 2022-09-12 DIAGNOSIS — J449 Chronic obstructive pulmonary disease, unspecified: Secondary | ICD-10-CM | POA: Diagnosis not present

## 2022-09-12 DIAGNOSIS — Z2914 Encounter for prophylactic rabies immune globin: Secondary | ICD-10-CM | POA: Insufficient documentation

## 2022-09-12 DIAGNOSIS — S81852A Open bite, left lower leg, initial encounter: Secondary | ICD-10-CM | POA: Insufficient documentation

## 2022-09-12 DIAGNOSIS — Z79899 Other long term (current) drug therapy: Secondary | ICD-10-CM | POA: Insufficient documentation

## 2022-09-12 DIAGNOSIS — I2510 Atherosclerotic heart disease of native coronary artery without angina pectoris: Secondary | ICD-10-CM | POA: Diagnosis not present

## 2022-09-12 DIAGNOSIS — Z23 Encounter for immunization: Secondary | ICD-10-CM | POA: Insufficient documentation

## 2022-09-12 DIAGNOSIS — Z7951 Long term (current) use of inhaled steroids: Secondary | ICD-10-CM | POA: Diagnosis not present

## 2022-09-12 DIAGNOSIS — Z203 Contact with and (suspected) exposure to rabies: Secondary | ICD-10-CM | POA: Diagnosis not present

## 2022-09-12 DIAGNOSIS — E039 Hypothyroidism, unspecified: Secondary | ICD-10-CM | POA: Insufficient documentation

## 2022-09-12 HISTORY — DX: Chronic obstructive pulmonary disease, unspecified: J44.9

## 2022-09-12 MED ORDER — RABIES IMMUNE GLOBULIN 150 UNIT/ML IM INJ
20.0000 [IU]/kg | INJECTION | Freq: Once | INTRAMUSCULAR | Status: AC
Start: 2022-09-12 — End: 2022-09-12
  Administered 2022-09-12: 2250 [IU]

## 2022-09-12 MED ORDER — DOXYCYCLINE HYCLATE 100 MG PO TABS
100.0000 mg | ORAL_TABLET | Freq: Once | ORAL | Status: AC
  Administered 2022-09-12: 100 mg via ORAL
  Filled 2022-09-12: qty 1

## 2022-09-12 MED ORDER — METRONIDAZOLE 500 MG PO TABS: 500.0000 mg | ORAL_TABLET | Freq: Two times a day (BID) | ORAL | 0 refills | Status: DC

## 2022-09-12 MED ORDER — RABIES VACCINE, PCEC IM SUSR
1.0000 mL | Freq: Once | INTRAMUSCULAR | Status: AC
  Administered 2022-09-12: 1 mL via INTRAMUSCULAR
  Filled 2022-09-12: qty 1

## 2022-09-12 MED ORDER — ONDANSETRON 8 MG PO TBDP
8.0000 mg | ORAL_TABLET | Freq: Once | ORAL | Status: AC
  Administered 2022-09-12: 8 mg via ORAL
  Filled 2022-09-12: qty 1

## 2022-09-12 MED ORDER — TETANUS-DIPHTH-ACELL PERTUSSIS 5-2.5-18.5 LF-MCG/0.5 IM SUSY
0.5000 mL | PREFILLED_SYRINGE | Freq: Once | INTRAMUSCULAR | Status: AC
Start: 2022-09-12 — End: 2022-09-12
  Administered 2022-09-12: 0.5 mL via INTRAMUSCULAR
  Filled 2022-09-12: qty 0.5

## 2022-09-12 MED ORDER — DOXYCYCLINE HYCLATE 100 MG PO CAPS: 100.0000 mg | ORAL_CAPSULE | Freq: Two times a day (BID) | ORAL | 0 refills | Status: AC

## 2022-09-12 MED ORDER — HYDROCODONE-ACETAMINOPHEN 5-325 MG PO TABS
2.0000 | ORAL_TABLET | Freq: Once | ORAL | Status: AC
  Administered 2022-09-12: 2 via ORAL
  Filled 2022-09-12: qty 2

## 2022-09-12 MED ORDER — METRONIDAZOLE 500 MG PO TABS
500.0000 mg | ORAL_TABLET | Freq: Once | ORAL | Status: AC
  Administered 2022-09-12: 500 mg via ORAL
  Filled 2022-09-12: qty 1

## 2022-09-12 MED ORDER — HYDROMORPHONE HCL 1 MG/ML IJ SOLN
0.5000 mg | Freq: Once | INTRAMUSCULAR | Status: AC
  Administered 2022-09-12: 0.5 mg via INTRAMUSCULAR
  Filled 2022-09-12: qty 0.5

## 2022-09-12 NOTE — ED Provider Notes (Signed)
[facility] EMERGENCY DEPARTMENT Provider Note   CSN: [id number] Arrival date & time: 09/12/22  1814     History  Chief Complaint  Patient presents with   Animal Bite    James Huynh is a 61 y.o. male.  Patient presents 90 minutes after being attacked by his cat.  Cat has been unvaccinated for the past couple years.  Cat is occasionally allowed to run outside unattended.  He states the cat got riled up after seeing something in the backyard.  He went to calm it down when the cat suddenly jumped on him.  He has multiple wounds to the left lower extremity.  Has had some bleeding since the time of the incident.  Unsure of his last tetanus shot.  Denies other complaints.  The history is provided by the patient. No language interpreter was used.       Home Medications Prior to Admission medications   Medication Sig Start Date End Date Taking? Authorizing Provider  albuterol (PROVENTIL) (2.5 MG/3ML) 0.083% nebulizer solution Take 2.5 mg by nebulization every 6 (six) hours as needed for wheezing or shortness of breath.    [provider]  albuterol (VENTOLIN HFA) 108 (90 Base) MCG/ACT inhaler Inhale 2 puffs into the lungs every 6 (six) hours as needed for wheezing or shortness of breath.    [provider]  aspirin EC 81 MG tablet Take 81 mg by mouth every evening.    [provider]  benzonatate (TESSALON) 100 MG capsule Take 1 capsule (100 mg total) by mouth every 8 (eight) hours. 09/07/22   Eber Hong, MD  Budeson-Glycopyrrol-Formoterol (BREZTRI AEROSPHERE) 160-9-4.8 MCG/ACT AERO Inhale 2 puffs into the lungs 2 (two) times daily. 03/17/21   Nyoka Cowden, MD  Cholecalciferol (VITAMIN D3 PO) Take 1 tablet by mouth in the morning.    [provider]  ezetimibe (ZETIA) 10 MG tablet Take 1 tablet by mouth once daily 04/25/22   Rollene Rotunda, MD  furosemide (LASIX) 40 MG tablet Take 1 tablet (40 mg total) by mouth daily. 05/23/22   Rollene Rotunda,  MD  levothyroxine (SYNTHROID) 150 MCG tablet Take 150 mcg by mouth daily. 01/02/22   [provider]  meloxicam (MOBIC) 7.5 MG tablet Take 1 tablet (7.5 mg total) by mouth 2 (two) times daily as needed for up to 14 days for pain. 09/07/22 09/21/22  Eber Hong, MD  metoprolol succinate (TOPROL-XL) 25 MG 24 hr tablet Take 0.5 tablets (12.5 mg total) by mouth at bedtime. 06/09/22   Terald Sleeper, MD  nitroGLYCERIN (NITROSTAT) 0.4 MG SL tablet PLACE 1 TABLET UNDER THE TONGUE EVERY 5 MIN UP TO 3 TIMES AS NEEDED FOR CHEST PAIN 01/18/22   Rollene Rotunda, MD  omeprazole (PRILOSEC) 40 MG capsule Take 1 capsule (40 mg total) by mouth daily. 09/05/22   Nyoka Cowden, MD  potassium chloride (KLOR-CON) 10 MEQ tablet Take 1 tablet (10 mEq total) by mouth daily for 30 doses. 06/09/22 07/09/22  Terald Sleeper, MD  rosuvastatin (CRESTOR) 40 MG tablet Take 1 tablet (40 mg total) by mouth daily. Patient taking differently: Take 40 mg by mouth at bedtime. 12/13/21   Rollene Rotunda, MD  ticagrelor (BRILINTA) 90 MG TABS tablet Take 1 tablet (90 mg total) by mouth 2 (two) times daily. 01/18/22   Jonita Albee, PA-C      Allergies    Penicillins    Review of Systems   Review of Systems  Constitutional:  Negative for  fever.  Musculoskeletal:  Negative for arthralgias.  Skin:  Positive for wound.  All other systems reviewed and are negative.   Physical Exam Updated Vital Signs BP 114/75 (BP Location: Right Arm)   Pulse 100   Temp 98.6 F (37 C) (Oral)   Resp (!) 25   Ht 5\' 10"  (1.778 m)   Wt 113.4 kg   SpO2 97%   BMI 35.87 kg/m  Physical Exam Vitals and nursing note reviewed.  Constitutional:      General: He is not in acute distress.    Appearance: Normal appearance. He is not ill-appearing.  HENT:     Head: Normocephalic and atraumatic.     Nose: Nose normal.  Eyes:     Conjunctiva/sclera: Conjunctivae normal.  Cardiovascular:     Rate and Rhythm: Normal rate and regular  rhythm.  Pulmonary:     Effort: Pulmonary effort is normal. No respiratory distress.  Musculoskeletal:        General: No deformity. Normal range of motion.     Cervical back: Normal range of motion.     Comments: Good range of motion in left knee, left ankle.  5/5 strength with extension flexible scopes of both lower extremities.  Plus DP pulse present.  Brisk cap refill.  Sensation intact.  Skin:    General: Skin is warm.     Findings: No rash.     Comments: Multiple wounds noted to left lower extremity including some puncture wounds.  No active bleeding at this time.  However significant amount of dried blood noted surrounding the wounds.  2+ DP pulse present bilaterally.  Sensation intact.  Brisk cap refill.  Neurological:     Mental Status: He is alert.     ED Results / Procedures / Treatments   Labs (all labs ordered are listed, but only abnormal results are displayed) Labs Reviewed - No data to display  EKG None  Radiology No results found.  Procedures Procedures    Medications Ordered in ED Medications  rabies immune globulin (HYPERRAB/KEDRAB) injection 2,250 Units (has no administration in time range)  rabies vaccine (RABAVERT) injection 1 mL (has no administration in time range)  Tdap (BOOSTRIX) injection 0.5 mL (has no administration in time range)  metroNIDAZOLE (FLAGYL) tablet 500 mg (has no administration in time range)  doxycycline (VIBRA-TABS) tablet 100 mg (has no administration in time range)  HYDROcodone-acetaminophen (NORCO/VICODIN) 5-325 MG per tablet 2 tablet (has no administration in time range)    ED Course/ Medical Decision Making/ A&P                           Medical Decision Making Risk Prescription drug management.   Medical Decision Making / ED Course   This patient presents to the ED for concern of cat bite/scratch, this involves an extensive number of treatment options, and is a complaint that carries with it a high risk of  complications and morbidity.  The differential diagnosis includes laceration, wound, rabies, wound infection  MDM: Patient presents after being attacked by his cat.  Cat has isolated the past couple years.  Multiple wounds noted to left lower extremity.  This was extensively cleansed in the emergency room.  Patient started on antibiotics.  Given rabies prophylaxis after extensive discussion regarding conservative watch and wait approach with animal control involved.  He prefers to receive rabies prophylaxis.  Allergies include anaphylaxis to penicillins.  Given Flagyl, and doxycycline.  Tdap updated. Was  extensively cleansed.  Pictures attached under media tab of the wounds.  She does of antibiotics given.  Rabies vaccinations given.  Patient is appropriate for discharge.  Nonadherent dressing applied.  Patient is appropriate for discharge.  Discharged stable condition.  Return precautions discussed.  Discussed follow-up with PCP.   Lab Tests: -I ordered, reviewed, and interpreted labs.   The pertinent results include:   Labs Reviewed - No data to display    EKG  EKG Interpretation  Date/Time:    Ventricular Rate:    PR Interval:    QRS Duration:   QT Interval:    QTC Calculation:   R Axis:     Text Interpretation:           Medicines ordered and prescription drug management: Meds ordered this encounter  Medications   rabies immune globulin (HYPERRAB/KEDRAB) injection 2,250 Units   rabies vaccine (RABAVERT) injection 1 mL   Tdap (BOOSTRIX) injection 0.5 mL   metroNIDAZOLE (FLAGYL) tablet 500 mg   doxycycline (VIBRA-TABS) tablet 100 mg   HYDROcodone-acetaminophen (NORCO/VICODIN) 5-325 MG per tablet 2 tablet    -I have reviewed the patients home medicines and have made adjustments as needed  Reevaluation: After the interventions noted above, I reevaluated the patient and found that they have :improved  Co morbidities that complicate the patient evaluation  Past Medical  History:  Diagnosis Date   Asthma    CAD (coronary artery disease)    cath 09/09/2015 20% prox RCA, 80% prox to mid LCx treated with DES (3.518 mm Xience Alpine DES), 80% mid LAD lesion treated with 3.518 mm Xience Alpine DES stent postdilated to 4.2.   Chest pain    a. negative stress echo August 2011   COPD (chronic obstructive pulmonary disease) (HCC)    GERD (gastroesophageal reflux disease)    High cholesterol    History of pneumonia    a. 2014.   Hypothyroidism    Morbid obesity (HCC)    Tobacco abuse      Dispostion: Patient is appropriate for discharge.  Discharged in stable condition.  Final Clinical Impression(s) / ED Diagnoses Final diagnoses:  Cat bite, initial encounter  Need for post exposure prophylaxis for rabies    Rx / DC Orders ED Discharge Orders          Ordered    doxycycline (VIBRAMYCIN) 100 MG capsule  2 times daily        09/12/22 2249    metroNIDAZOLE (FLAGYL) 500 MG tablet  2 times daily        09/12/22 2249              Marita Kansas, PA-C 09/12/22 2249    Eber Hong, MD 09/14/22 1513

## 2022-09-12 NOTE — ED Notes (Signed)
Set patient up for series of rabies injections in LLE, gave 1st of seven injections to be given directly to injury area, patient did not tolerate injection well although he was medicated with oral pain med 20 minutes before. Patient refuses to allow me to finish series of injections unless he is given additional pain meds, will notify MD.

## 2022-09-12 NOTE — ED Triage Notes (Signed)
Pt was attacked by his cat that was unprovoked. Pt has cat bite to the left lower leg.

## 2022-09-12 NOTE — Discharge Instructions (Addendum)
You received prophylactic rabies vaccinations today.  These will need to be repeated on day 3, day 7, and day 14.  You could have this done at your PCP office, or go to urgent care.  If you notice signs of infection including redness, swelling wound, drainage from your wounds please return for evaluation.  If you develop a fever without any other source please return for reevaluation.  I did send antibiotics into the pharmacy.  Please take these as directed.  Otherwise I do want you to follow-up with your primary care provider.

## 2022-09-14 ENCOUNTER — Other Ambulatory Visit: Payer: Self-pay

## 2022-09-14 ENCOUNTER — Emergency Department (HOSPITAL_COMMUNITY)
Admission: EM | Admit: 2022-09-14 | Discharge: 2022-09-14 | Disposition: A | Discharge status: Home / Self Care | Payer: Medicaid Other | Attending: Emergency Medicine | Admitting: Emergency Medicine

## 2022-09-14 ENCOUNTER — Encounter (HOSPITAL_COMMUNITY): Payer: Self-pay | Admitting: *Deleted

## 2022-09-14 DIAGNOSIS — Z7982 Long term (current) use of aspirin: Secondary | ICD-10-CM | POA: Diagnosis not present

## 2022-09-14 DIAGNOSIS — Z5189 Encounter for other specified aftercare: Secondary | ICD-10-CM

## 2022-09-14 DIAGNOSIS — S80812D Abrasion, left lower leg, subsequent encounter: Secondary | ICD-10-CM | POA: Diagnosis not present

## 2022-09-14 DIAGNOSIS — X58XXXD Exposure to other specified factors, subsequent encounter: Secondary | ICD-10-CM | POA: Diagnosis not present

## 2022-09-14 DIAGNOSIS — Z203 Contact with and (suspected) exposure to rabies: Secondary | ICD-10-CM | POA: Diagnosis not present

## 2022-09-14 HISTORY — DX: Bronchitis, not specified as acute or chronic: J40

## 2022-09-14 NOTE — ED Provider Notes (Signed)
[facility] EMERGENCY DEPARTMENT Provider Note   CSN: [id number] Arrival date & time: 09/14/22  1613     History Chief Complaint  Patient presents with   Rabies Injection    James Huynh is a 61 y.o. male presents the emergency department today for his second rabies shot.  Patient had started his rabies vaccinations on 09-12-2022.  He reports that he went to his primary care office and urgent care however they did not have the rabies vaccination.  He did not go to a [place] urgent care.  He reports has been cleaning the wound with peroxide and water and covering it with a bandage.  Denies any discharge, increased pain, or fevers.  HPI     Home Medications Prior to Admission medications   Medication Sig Start Date End Date Taking? Authorizing Provider  albuterol (PROVENTIL) (2.5 MG/3ML) 0.083% nebulizer solution Take 2.5 mg by nebulization every 6 (six) hours as needed for wheezing or shortness of breath.    [provider]  albuterol (VENTOLIN HFA) 108 (90 Base) MCG/ACT inhaler Inhale 2 puffs into the lungs every 6 (six) hours as needed for wheezing or shortness of breath.    [provider]  aspirin EC 81 MG tablet Take 81 mg by mouth every evening.    [provider]  benzonatate (TESSALON) 100 MG capsule Take 1 capsule (100 mg total) by mouth every 8 (eight) hours. 09/07/22   Eber Hong, MD  Budeson-Glycopyrrol-Formoterol (BREZTRI AEROSPHERE) 160-9-4.8 MCG/ACT AERO Inhale 2 puffs into the lungs 2 (two) times daily. 03/17/21   Nyoka Cowden, MD  Cholecalciferol (VITAMIN D3 PO) Take 1 tablet by mouth in the morning.    [provider]  doxycycline (VIBRAMYCIN) 100 MG capsule Take 1 capsule (100 mg total) by mouth 2 (two) times daily for 7 days. 09/12/22 09/19/22  Marita Kansas, PA-C  ezetimibe (ZETIA) 10 MG tablet Take 1 tablet by mouth once daily 04/25/22   Rollene Rotunda, MD  furosemide (LASIX) 40 MG tablet Take 1 tablet (40 mg total) by  mouth daily. 05/23/22   Rollene Rotunda, MD  levothyroxine (SYNTHROID) 150 MCG tablet Take 150 mcg by mouth daily. 01/02/22   [provider]  meloxicam (MOBIC) 7.5 MG tablet Take 1 tablet (7.5 mg total) by mouth 2 (two) times daily as needed for up to 14 days for pain. 09/07/22 09/21/22  Eber Hong, MD  metoprolol succinate (TOPROL-XL) 25 MG 24 hr tablet Take 0.5 tablets (12.5 mg total) by mouth at bedtime. 06/09/22   Terald Sleeper, MD  metroNIDAZOLE (FLAGYL) 500 MG tablet Take 1 tablet (500 mg total) by mouth 2 (two) times daily. 09/12/22   Karie Mainland, Amjad, PA-C  nitroGLYCERIN (NITROSTAT) 0.4 MG SL tablet PLACE 1 TABLET UNDER THE TONGUE EVERY 5 MIN UP TO 3 TIMES AS NEEDED FOR CHEST PAIN 01/18/22   Rollene Rotunda, MD  omeprazole (PRILOSEC) 40 MG capsule Take 1 capsule (40 mg total) by mouth daily. 09/05/22   Nyoka Cowden, MD  potassium chloride (KLOR-CON) 10 MEQ tablet Take 1 tablet (10 mEq total) by mouth daily for 30 doses. 06/09/22 07/09/22  Terald Sleeper, MD  rosuvastatin (CRESTOR) 40 MG tablet Take 1 tablet (40 mg total) by mouth daily. Patient taking differently: Take 40 mg by mouth at bedtime. 12/13/21   Rollene Rotunda, MD  ticagrelor (BRILINTA) 90 MG TABS tablet Take 1 tablet (90 mg total) by mouth 2 (two) times daily. 01/18/22   Jonita Albee, PA-C  Allergies    Penicillins    Review of Systems   Review of Systems  Skin:  Positive for wound.    Physical Exam Updated Vital Signs BP 109/69 (BP Location: Right Arm)   Pulse 85   Temp 97.8 F (36.6 C) (Oral)   Resp (!) 24   SpO2 97%  Physical Exam Vitals and nursing note reviewed.  Constitutional:      General: He is not in acute distress.    Appearance: Normal appearance. He is not ill-appearing or toxic-appearing.  Eyes:     General: No scleral icterus. Pulmonary:     Effort: Pulmonary effort is normal. No respiratory distress.  Skin:    General: Skin is dry.     Findings: No rash.     Comments: Dried  blood/scabs with several superficial abrasions noted to the left lower extremity.  No red streaking noted.  No active discharge or bleeding.  Neurological:     General: No focal deficit present.     Mental Status: He is alert. Mental status is at baseline.  Psychiatric:        Mood and Affect: Mood normal.     ED Results / Procedures / Treatments   Labs (all labs ordered are listed, but only abnormal results are displayed) Labs Reviewed - No data to display  EKG None  Radiology No results found.  Procedures Procedures   Medications Ordered in ED Medications - No data to display  ED Course/ Medical Decision Making/ A&P                           Medical Decision Making  61 year old male presents emerged from today for evaluation of second rabies encounter and also would like his wound reevaluated while he is here.  Differential diagnose includes was limited to cellulitis, worsening infection, lymphadenitis.  Vital signs are unremarkable other than slightly increased respiratory count although he is speaking in full sentences without any increased work of breath with me.  Physical exam as noted above.  Patient had his initial rabies vaccination on 11-29.  It is too early for him to receive his second vaccination.  I alerted him of this.  Gave him information for [place] urgent care with [facility] to go to tomorrow to get his vaccination.  We discussed adhering to his medications and taking ibuprofen or Tylenol as needed for pain.  We discussed return precautions and red flag symptoms.  Discussed wound cleaning measures.  Patient verbalizes understanding and agrees with plan.  Patient is stable being discharged home in good condition.   Final Clinical Impression(s) / ED Diagnoses Final diagnoses:  Visit for wound check    Rx / DC Orders ED Discharge Orders     None         Achille Rich, PA-C 09/16/22 1317    Eber Hong, MD 09/17/22 [phone]

## 2022-09-14 NOTE — Discharge Instructions (Addendum)
Please clean the wound daily with Dial soap and water.  I would avoid using hydrogen peroxide as it does not promote good wound healing.  Please make sure you go to the [place] urgent care tomorrow for your second rabies vaccination as I will be on day 3.  I included information for this in the discharge paperwork.  If you have any concerns, new or worsening symptoms, please return to the nearest emergency department for evaluation.

## 2022-09-14 NOTE — ED Triage Notes (Addendum)
Pt here for second rabies shots, states PCP and UC do not have the injection per pt. Pt requesting wound check as well.

## 2022-09-15 ENCOUNTER — Ambulatory Visit
Admission: EM | Admit: 2022-09-15 | Discharge: 2022-09-15 | Disposition: A | Discharge status: Home / Self Care | Payer: Medicaid Other | Attending: Family Medicine | Admitting: Family Medicine

## 2022-09-15 DIAGNOSIS — Z203 Contact with and (suspected) exposure to rabies: Secondary | ICD-10-CM

## 2022-09-15 DIAGNOSIS — Z2914 Encounter for prophylactic rabies immune globin: Secondary | ICD-10-CM | POA: Diagnosis not present

## 2022-09-15 MED ORDER — RABIES VACCINE, PCEC IM SUSR
1.0000 mL | Freq: Once | INTRAMUSCULAR | Status: AC
  Administered 2022-09-15: 1 mL via INTRAMUSCULAR

## 2022-09-15 NOTE — ED Triage Notes (Addendum)
Pt presents for 2nd rabies vaccine. Denies any changes to bite site, fever, nausea, or pain.

## 2022-09-19 ENCOUNTER — Ambulatory Visit
Admission: EM | Admit: 2022-09-19 | Discharge: 2022-09-19 | Disposition: A | Discharge status: Home / Self Care | Payer: Medicaid Other | Attending: Family Medicine | Admitting: Family Medicine

## 2022-09-19 ENCOUNTER — Encounter: Payer: Self-pay | Admitting: Emergency Medicine

## 2022-09-19 DIAGNOSIS — Z23 Encounter for immunization: Secondary | ICD-10-CM | POA: Diagnosis not present

## 2022-09-19 DIAGNOSIS — Z203 Contact with and (suspected) exposure to rabies: Secondary | ICD-10-CM | POA: Diagnosis not present

## 2022-09-19 MED ORDER — RABIES VACCINE, PCEC IM SUSR
1.0000 mL | Freq: Once | INTRAMUSCULAR | Status: AC
  Administered 2022-09-19: 1 mL via INTRAMUSCULAR

## 2022-09-26 ENCOUNTER — Ambulatory Visit
Admission: EM | Admit: 2022-09-26 | Discharge: 2022-09-26 | Disposition: A | Discharge status: Home / Self Care | Payer: Medicaid Other | Attending: Nurse Practitioner | Admitting: Nurse Practitioner

## 2022-09-26 DIAGNOSIS — Z23 Encounter for immunization: Secondary | ICD-10-CM

## 2022-09-26 MED ORDER — RABIES VACCINE, PCEC IM SUSR
1.0000 mL | Freq: Once | INTRAMUSCULAR | Status: AC
  Administered 2022-09-26: 1 mL via INTRAMUSCULAR

## 2022-09-26 NOTE — ED Triage Notes (Signed)
Pt presents for last Rabies shot.  

## 2022-09-28 NOTE — ED Triage Notes (Signed)
Patient here for 3rd rabies vaccine.

## 2022-10-06 ENCOUNTER — Ambulatory Visit (INDEPENDENT_AMBULATORY_CARE_PROVIDER_SITE_OTHER): Payer: Medicaid Other

## 2022-10-06 ENCOUNTER — Ambulatory Visit
Admission: EM | Admit: 2022-10-06 | Discharge: 2022-10-06 | Disposition: A | Discharge status: Home / Self Care | Payer: Medicaid Other

## 2022-10-06 DIAGNOSIS — R059 Cough, unspecified: Secondary | ICD-10-CM

## 2022-10-06 DIAGNOSIS — J439 Emphysema, unspecified: Secondary | ICD-10-CM | POA: Diagnosis not present

## 2022-10-06 MED ORDER — PREDNISONE 50 MG PO TABS: ORAL_TABLET | ORAL | 0 refills | Status: DC

## 2022-10-06 MED ORDER — METHYLPREDNISOLONE SODIUM SUCC 125 MG IJ SOLR
125.0000 mg | Freq: Once | INTRAMUSCULAR | Status: AC
  Administered 2022-10-06: 125 mg via INTRAMUSCULAR

## 2022-10-06 MED ORDER — PROMETHAZINE-DM 6.25-15 MG/5ML PO SYRP: 5.0000 mL | ORAL_SOLUTION | Freq: Four times a day (QID) | ORAL | 0 refills | Status: DC | PRN

## 2022-10-06 NOTE — Discharge Instructions (Addendum)
Take medication as prescribed. Increase fluids and allow for plenty of rest. Recommend using a humidifier in the bedroom at nighttime during sleep and sleeping elevated on pillows while cough symptoms persist. If you develop worsening cough, difficulty breathing, or become unable to speak in a complete sentence, please follow-up in the emergency department for further evaluation. As discussed, because symptoms have been persistent, and appear to be mildly resistant to treatment, please follow-up with your pulmonologist for further evaluation. Follow-up as needed.

## 2022-10-06 NOTE — ED Provider Notes (Signed)
[facility]    CSN: [id number] Arrival date & time: 10/06/22  4098      History   Chief Complaint Chief Complaint  Patient presents with   Cough         HPI James Huynh is a 61 y.o. male.   The history is provided by the patient.   Patient presents for complaints of cough and congestion that been present over the past several weeks.  Patient reports he has a history of emphysema.  Patient states that he did reach out to his PCP and was prescribed an antibiotic along with prednisone when his symptoms initially started.  He states that he completed that prescription.  Patient reports increased coughing, wheezing, with some shortness of breath.  Patient states cough is so bad that it makes his head hurt.  He states that he did go to the emergency department approximately 1 week ago and was told that he "separated the ribs" because of the coughing.  Patient denies fever, chills, chest pain, abdominal pain, nausea, vomiting, or diarrhea.  Patient reports that he did have an old prescription from a dental procedure for clindamycin and states he has been taking that.  Patient states that he also has been using his albuterol nebulizer treatment and albuterol inhaler for his symptoms.   Past Medical History:  Diagnosis Date   Asthma    Bronchitis    CAD (coronary artery disease)    cath 09/09/2015 20% prox RCA, 80% prox to mid LCx treated with DES (3.518 mm Xience Alpine DES), 80% mid LAD lesion treated with 3.518 mm Xience Alpine DES stent postdilated to 4.2.   Chest pain    a. negative stress echo August 2011   COPD (chronic obstructive pulmonary disease) (HCC)    GERD (gastroesophageal reflux disease)    High cholesterol    History of pneumonia    a. 2014.   Hypothyroidism    Morbid obesity (HCC)    Tobacco abuse     Patient Active Problem List   Diagnosis Date Noted   DOE (dyspnea on exertion) 07/19/2022   Former cigarette smoker 02/19/2022   COPD GOLD  II vs ACOS  12/29/2020   Dyslipidemia 12/29/2020   Coronary artery disease involving native coronary artery of native heart without angina pectoris 12/29/2020   Hypothyroidism    High cholesterol    Morbid obesity (HCC)    NSTEMI (non-ST elevated myocardial infarction) (HCC) 09/08/2015    Past Surgical History:  Procedure Laterality Date   CARDIAC CATHETERIZATION N/A 09/09/2015   Procedure: Left Heart Cath and Coronary Angiography;  Surgeon: Lennette Bihari, MD;  Location: [facility];  Service: Cardiovascular;  Laterality: N/A;   CARDIAC CATHETERIZATION  09/09/2015   Procedure: Coronary Stent Intervention;  Surgeon: Lennette Bihari, MD;  Location: [facility];  Service: Cardiovascular;;   CORONARY STENT INTERVENTION N/A 01/17/2022   Procedure: CORONARY STENT INTERVENTION;  Surgeon: Iran Ouch, MD;  Location: [facility];  Service: Cardiovascular;  Laterality: N/A;   LEFT HEART CATH AND CORONARY ANGIOGRAPHY N/A 01/17/2022   Procedure: LEFT HEART CATH AND CORONARY ANGIOGRAPHY;  Surgeon: Iran Ouch, MD;  Location: [facility];  Service: Cardiovascular;  Laterality: N/A;   lymph gland removal     MANDIBLE SURGERY     broken jaw       Home Medications    Prior to Admission medications   Medication Sig Start Date End Date Taking?  Authorizing Provider  clindamycin (CLEOCIN) 150 MG capsule Take 150 mg by mouth 4 (four) times daily. 09/28/22  Yes [provider]  predniSONE (DELTASONE) 50 MG tablet Take 1 tablet daily with breakfast for the next 5 days. 10/06/22  Yes Christionna Poland-Warren, Sadie Haber, NP  promethazine-dextromethorphan (PROMETHAZINE-DM) 6.25-15 MG/5ML syrup Take 5 mLs by mouth 4 (four) times daily as needed for cough. 10/06/22  Yes Reshaun Briseno-Warren, Sadie Haber, NP  albuterol (PROVENTIL) (2.5 MG/3ML) 0.083% nebulizer solution Take 2.5 mg by nebulization every 6 (six) hours as needed for wheezing or shortness of breath.    [provider]  albuterol (VENTOLIN HFA) 108 (90 Base) MCG/ACT inhaler Inhale 2 puffs into the lungs every 6 (six) hours as needed for wheezing or shortness of breath.    [provider]  aspirin EC 81 MG tablet Take 81 mg by mouth every evening.    [provider]  benzonatate (TESSALON) 100 MG capsule Take 1 capsule (100 mg total) by mouth every 8 (eight) hours. 09/07/22   Eber Hong, MD  Budeson-Glycopyrrol-Formoterol (BREZTRI AEROSPHERE) 160-9-4.8 MCG/ACT AERO Inhale 2 puffs into the lungs 2 (two) times daily. 03/17/21   Nyoka Cowden, MD  Cholecalciferol (VITAMIN D3 PO) Take 1 tablet by mouth in the morning.    [provider]  ezetimibe (ZETIA) 10 MG tablet Take 1 tablet by mouth once daily 04/25/22   Rollene Rotunda, MD  furosemide (LASIX) 40 MG tablet Take 1 tablet (40 mg total) by mouth daily. 05/23/22   Rollene Rotunda, MD  levothyroxine (SYNTHROID) 150 MCG tablet Take 150 mcg by mouth daily. 01/02/22   [provider]  metoprolol succinate (TOPROL-XL) 25 MG 24 hr tablet Take 0.5 tablets (12.5 mg total) by mouth at bedtime. 06/09/22   Terald Sleeper, MD  metroNIDAZOLE (FLAGYL) 500 MG tablet Take 1 tablet (500 mg total) by mouth 2 (two) times daily. 09/12/22   Karie Mainland, Amjad, PA-C  nitroGLYCERIN (NITROSTAT) 0.4 MG SL tablet PLACE 1 TABLET UNDER THE TONGUE EVERY 5 MIN UP TO 3 TIMES AS NEEDED FOR CHEST PAIN 01/18/22   Rollene Rotunda, MD  omeprazole (PRILOSEC) 40 MG capsule Take 1 capsule (40 mg total) by mouth daily. 09/05/22   Nyoka Cowden, MD  potassium chloride (KLOR-CON) 10 MEQ tablet Take 1 tablet (10 mEq total) by mouth daily for 30 doses. 06/09/22 07/09/22  Terald Sleeper, MD  rosuvastatin (CRESTOR) 40 MG tablet Take 1 tablet (40 mg total) by mouth daily. Patient taking differently: Take 40 mg by mouth at bedtime. 12/13/21   Rollene Rotunda, MD  ticagrelor (BRILINTA) 90 MG TABS tablet Take 1 tablet (90 mg total) by mouth 2 (two) times daily. 01/18/22   Jonita Albee, PA-C    Family History Family History  Problem Relation Age of Onset   Heart attack Father        history of MI in his 74's with bypass surgery - now alive @ 59.   Lung cancer Mother        died in the 74's.    Social History Social History   Tobacco Use   Smoking status: Every Day    Packs/day: 1.00    Years: 32.00    Total pack years: 32.00    Types: Cigarettes    Start date: 08/06/1975    Last attempt to quit: 01/30/2021    Years since quitting: 1.6    Passive exposure: Current   Smokeless tobacco: Never  Vaping Use  Vaping Use: Never used  Substance Use Topics   Alcohol use: No    Alcohol/week: 0.0 standard drinks of alcohol   Drug use: Yes    Frequency: 1.0 times per week    Comment: occasionally smokes marijuana.-Denies 01/15/22     Allergies   Penicillins   Review of Systems Review of Systems Per HPI  Physical Exam Triage Vital Signs ED Triage Vitals  Enc Vitals Group     BP 10/06/22 1051 108/73     Pulse Rate 10/06/22 1051 86     Resp 10/06/22 1051 (!) 22     Temp 10/06/22 1051 (!) 97.5 F (36.4 C)     Temp Source 10/06/22 1051 Oral     SpO2 10/06/22 1051 97 %     Weight --      Height --      Head Circumference --      Peak Flow --      Pain Score 10/06/22 1054 0     Pain Loc --      Pain Edu? --      Excl. in GC? --    No data found.  Updated Vital Signs BP 108/73 (BP Location: Right Arm)   Pulse 86   Temp (!) 97.5 F (36.4 C) (Oral)   Resp (!) 22   SpO2 97%   Visual Acuity Right Eye Distance:   Left Eye Distance:   Bilateral Distance:    Right Eye Near:   Left Eye Near:    Bilateral Near:     Physical Exam Vitals and nursing note reviewed.  Constitutional:      General: He is not in acute distress.    Appearance: Normal appearance.  HENT:     Head: Normocephalic.     Right Ear: Tympanic membrane, ear canal and external ear normal.     Left Ear: Tympanic membrane, ear canal and external ear normal.      Mouth/Throat:     Mouth: Mucous membranes are moist.     Pharynx: No posterior oropharyngeal erythema.  Eyes:     Extraocular Movements: Extraocular movements intact.     Pupils: Pupils are equal, round, and reactive to light.  Cardiovascular:     Rate and Rhythm: Normal rate and regular rhythm.     Pulses: Normal pulses.     Heart sounds: Normal heart sounds.  Pulmonary:     Effort: Pulmonary effort is normal.     Breath sounds: Wheezing (Expiratory wheezing noted in the posterior lung fields.) present.  Abdominal:     General: Bowel sounds are normal.     Palpations: Abdomen is soft.  Musculoskeletal:     Cervical back: Normal range of motion.  Lymphadenopathy:     Cervical: No cervical adenopathy.  Skin:    General: Skin is warm and dry.  Neurological:     General: No focal deficit present.     Mental Status: He is alert and oriented to person, place, and time.  Psychiatric:        Mood and Affect: Mood normal.        Behavior: Behavior normal.      UC Treatments / Results  Labs (all labs ordered are listed, but only abnormal results are displayed) Labs Reviewed - No data to display  EKG   Radiology DG Chest 2 View  Result Date: 10/06/2022 CLINICAL DATA:  cough x 1 month, hx of emphysema EXAM: CHEST - 2 VIEW COMPARISON:  Radiograph 09/07/2022 FINDINGS:  Unchanged cardiomediastinal silhouette. There is no focal airspace consolidation. Emphysema. Left basilar opacity corresponds to prominent pericardial fat. No acute osseous abnormality. Mild degenerative changes of the spine. IMPRESSION: No evidence of acute cardiopulmonary disease. Electronically Signed   By: Caprice Renshaw M.D.   On: 10/06/2022 11:28    Procedures Procedures (including critical care time)  Medications Ordered in UC Medications  methylPREDNISolone sodium succinate (SOLU-MEDROL) 125 mg/2 mL injection 125 mg (has no administration in time range)    Initial Impression / Assessment and Plan / UC  Course  I have reviewed the triage vital signs and the nursing notes.  Pertinent labs & imaging results that were available during my care of the patient were reviewed by me and considered in my medical decision making (see chart for details).  The patient is well-appearing, he is in no acute distress, although he is mildly tachypneic, vital signs are otherwise stable.  Symptoms appear to be consistent with acute emphysema.  Patient has been on previous medications with resistance to treatment including prednisone.  Patient declined albuterol nebulizer treatment in the office today.  Chest x-ray was negative for cardiopulmonary disease.  Patient was given Solu-Medrol 120 mg IM in the office today.  Will start patient on prednisone 50 mg and prescribed Promethazine DM for cough.  Patient was given supportive care recommendations, along with strict indications of when to go to the emergency department.  Based on patient's continued symptoms, recommend that he follow-up with pulmonology for further evaluation and treatment.  Patient verbalizes understanding.  All questions were answered.  Patient stable for discharge.   Final Clinical Impressions(s) / UC Diagnoses   Final diagnoses:  Pulmonary emphysema, unspecified emphysema type [facility])     Discharge Instructions      Take medication as prescribed. Increase fluids and allow for plenty of rest. Recommend using a humidifier in the bedroom at nighttime during sleep and sleeping elevated on pillows while cough symptoms persist. If you develop worsening cough, difficulty breathing, or become unable to speak in a complete sentence, please follow-up in the emergency department for further evaluation. As discussed, because symptoms have been persistent, and appear to be mildly resistant to treatment, please follow-up with your pulmonologist for further evaluation. Follow-up as needed.     ED Prescriptions     Medication Sig Dispense Auth.  Provider   predniSONE (DELTASONE) 50 MG tablet Take 1 tablet daily with breakfast for the next 5 days. 5 tablet Opie Fanton-Warren, Sadie Haber, NP   promethazine-dextromethorphan (PROMETHAZINE-DM) 6.25-15 MG/5ML syrup Take 5 mLs by mouth 4 (four) times daily as needed for cough. 118 mL Reyansh Kushnir-Warren, Sadie Haber, NP      PDMP not reviewed this encounter.   Abran Cantor, NP 10/06/22 1136

## 2022-10-06 NOTE — ED Triage Notes (Signed)
Pt reports cough and congestion since 08/24/22. Reports he has emphysema. Reports he was seeing at the ED 1 week ago and was told "he separated the ribs" due to cough. Pt taking clindamycin.

## 2022-10-10 ENCOUNTER — Ambulatory Visit: Payer: Medicaid Other | Admitting: Internal Medicine

## 2022-10-10 NOTE — Progress Notes (Deleted)
James Huynh, male    DOB: 07/11/1961   MRN: [id number]   Brief patient profile:  61  yowm  MM/Quit smoking (and inhaling) cigars  01/2021 p already stopped cigs in 1992 due to  dx of  asthma rx albuterol and more albuterol up to 4 inhalers  per month and much better after a week of prednisone referred to pulmonary [facility]yn[facility] in [facility]  02/13/2021 by [name], PA   Born at [facility] with "need for trach before age [age]" but "parents refused" then subsequenly  no problem age 22 then episodic  facial swelling  and difficulty breathing eval by "[facility]"  allergist in [place] > allergic to everything"  took shots until age 54 still problems in spring mostly rhinitis not asthma until 1992.       History of Present Illness  02/13/2021  Pulmonary/ 1st office eval/ James Huynh / [place] [facility]  Chief Complaint  Patient presents with   Pulmonary Consult    Referred by [name], PA. Pt c/o SOB for the past 2 years worse over the past 2 wks. Pt states that he always feels like he is unable to take a deep enough breath, feels better when he yawns. He is using his albuterol several times per day.   Dyspnea: limited by R radicular back pain > sob  Cough: sporadic /no rhinitis at initial eval or noct cough  Sleep: no problem noct lying flat SABA use: way too much  rec Plan A = Automatic = Always=    Symbicort 160 Take 2 puffs first thing in am and then another 2 puffs about 12 hours later.  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 Patient states he has gained 20 pounds. Had a heart attack in April 2023.   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   Plan B = Backup (to supplement plan A, not to replace it) Only use your albuterol inhaler as a rescue medication Plan C = Crisis (instead of Plan B but only if Plan B stops working) - only use your albuterol nebulizer if you first try Plan B   To get the most out of exercise, you need to be continuously  aware that you are short of breath, but never out of breath, for at least 30 minutes daily.  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 pace on a flat grade s sob but does fine slow and flat at [employer]  Cough: sensation of something stuck in throat / min mucus no am flares  Sleeping: flat bed/ one pillow/ some wheeze sleeping with dog and has been told he is allergic s/p remote allergy shots helped  SABA use: last used 6 h prior to OV   02: none  Covid status:   never  Lung cancer screening : in program  Rec Ok to try albuterol 15 min before an activity (on alternating days with neb vs inhaler vs nothing )  that you know would usually make you short of breath   To get the most out of exercise, you need to be continuously aware that you are short of breath, but never out of breath, for at least 20 minutes daily      08/24/2022  f/u ov/[place] office/James Huynh re: GOLD 2 COPD  maint on breztri  / still over using saba  Chief Complaint  Patient presents with   Follow-up    Having bad back pain  Breathing is about the same since last ov   Dyspnea:  Not limited by breathing from desired activities   Cough: in am clear mostly  Sleeping: flat bed/ dog in bedroom  no wheeze  SABA use: down to 1.5 inhalers per month/ rarely neb  02: none  Covid status: none  infected x one  Lung cancer screening: in program  Rec No change in recommendations  Only use your albuterol as a rescue medication Also  Ok to try albuterol 15 min before an activity (on alternating days)  that you know would usually make you short of breath    10/10/2022  ACUTE  ov/[place] office/James Huynh re: *** maint on ***  No chief complaint on file.   Dyspnea:  *** Cough: *** Sleeping: *** SABA use: *** 02: *** Covid status: *** Lung cancer screening: ***   No obvious day to day or daytime variability or assoc excess/ purulent sputum or mucus plugs or hemoptysis or cp or chest tightness, subjective wheeze or overt sinus or hb symptoms.   *** without nocturnal  or early am exacerbation  of respiratory  c/o's or need for noct saba. Also denies any obvious fluctuation of symptoms with weather or environmental changes or other aggravating or alleviating factors except as outlined above   No unusual exposure hx or h/o childhood pna/ asthma or knowledge of premature birth.  Current Allergies, Complete Past Medical History, Past Surgical History, Family History, and Social History were reviewed in [employer] record.  ROS  The following are not active complaints unless bolded Hoarseness, sore throat, dysphagia, dental problems, itching, sneezing,  nasal  congestion or discharge of excess mucus or purulent secretions, ear ache,   fever, chills, sweats, unintended wt loss or wt gain, classically pleuritic or exertional cp,  orthopnea pnd or arm/hand swelling  or leg swelling, presyncope, palpitations, abdominal pain, anorexia, nausea, vomiting, diarrhea  or change  in bowel habits or change in bladder habits, change in stools or change in urine, dysuria, hematuria,  rash, arthralgias, visual complaints, headache, numbness, weakness or ataxia or problems with walking or coordination,  change in mood or  memory.        No outpatient medications have been marked as taking for the 10/10/22 encounter (Appointment) with Nyoka Cowden, MD.                  Past Medical History:  Diagnosis Date   Asthma    CAD (coronary artery disease)    cath 09/09/2015 20% prox RCA, 80% prox to mid LCx treated with DES (3.518 mm Xience Alpine DES), 80% mid LAD lesion treated with 3.518 mm Xience Alpine DES stent postdilated to 4.2.   Chest pain    a. negative stress echo August 2011   GERD (gastroesophageal reflux disease)    High cholesterol    History of pneumonia    a. 2014.   Hypothyroidism    Morbid obesity (HCC)    Tobacco abuse       Objective:     Wts  10/10/2022     ***  08/24/2022     251  07/19/2022       250  02/19/2022         248  08/14/2021     230   03/17/21 234 lb 9.6 oz (106.4 kg)  02/13/21 234 lb (106.1 kg)  01/17/21 236 lb 3.2 oz (107.1 kg)    Vital signs reviewed  10/10/2022  - Note at rest 02 sats  ***% on ***   General appearance:    ***      Mild bar***          Assessment

## 2022-10-16 ENCOUNTER — Other Ambulatory Visit: Payer: Self-pay

## 2022-10-16 MED ORDER — TICAGRELOR 90 MG PO TABS: 90.0000 mg | ORAL_TABLET | Freq: Two times a day (BID) | ORAL | 2 refills | Status: DC

## 2022-10-16 NOTE — Telephone Encounter (Signed)
This is a [place] pt °

## 2022-10-26 ENCOUNTER — Other Ambulatory Visit: Payer: Self-pay | Admitting: Internal Medicine

## 2022-10-26 ENCOUNTER — Encounter: Payer: Self-pay | Admitting: Internal Medicine

## 2022-10-26 ENCOUNTER — Other Ambulatory Visit: Payer: Self-pay

## 2022-10-26 MED ORDER — BREZTRI AEROSPHERE 160-9-4.8 MCG/ACT IN AERO: 2.0000 | INHALATION_SPRAY | Freq: Two times a day (BID) | RESPIRATORY_TRACT | 11 refills | Status: DC

## 2022-11-12 ENCOUNTER — Encounter: Payer: Self-pay | Admitting: Cardiology

## 2022-12-04 ENCOUNTER — Ambulatory Visit (HOSPITAL_COMMUNITY)
Admission: RE | Admit: 2022-12-04 | Discharge: 2022-12-04 | Disposition: A | Discharge status: Home / Self Care | Payer: Medicaid Other | Source: Ambulatory Visit | Attending: Acute Care | Admitting: Acute Care

## 2022-12-04 DIAGNOSIS — Z87891 Personal history of nicotine dependence: Secondary | ICD-10-CM | POA: Insufficient documentation

## 2022-12-04 DIAGNOSIS — Z955 Presence of coronary angioplasty implant and graft: Secondary | ICD-10-CM | POA: Diagnosis not present

## 2022-12-04 DIAGNOSIS — Z122 Encounter for screening for malignant neoplasm of respiratory organs: Secondary | ICD-10-CM | POA: Diagnosis not present

## 2022-12-04 DIAGNOSIS — I700 Atherosclerosis of aorta: Secondary | ICD-10-CM | POA: Diagnosis not present

## 2022-12-04 DIAGNOSIS — J439 Emphysema, unspecified: Secondary | ICD-10-CM | POA: Diagnosis not present

## 2022-12-06 ENCOUNTER — Other Ambulatory Visit: Payer: Self-pay | Admitting: Internal Medicine

## 2022-12-06 ENCOUNTER — Other Ambulatory Visit: Payer: Self-pay

## 2022-12-06 DIAGNOSIS — Z87891 Personal history of nicotine dependence: Secondary | ICD-10-CM

## 2022-12-07 ENCOUNTER — Other Ambulatory Visit: Payer: Self-pay

## 2022-12-07 MED ORDER — OMEPRAZOLE 40 MG PO CPDR: 40.0000 mg | DELAYED_RELEASE_CAPSULE | Freq: Every day | ORAL | 2 refills | Status: DC

## 2022-12-13 ENCOUNTER — Encounter: Payer: Self-pay | Admitting: Cardiology

## 2022-12-13 ENCOUNTER — Other Ambulatory Visit: Payer: Self-pay

## 2022-12-13 MED ORDER — ROSUVASTATIN CALCIUM 40 MG PO TABS: 40.0000 mg | ORAL_TABLET | Freq: Every day | ORAL | 1 refills | Status: DC

## 2022-12-13 NOTE — Telephone Encounter (Signed)
Called pt. Appt made for April 16th. Prescription sent to pharmacy.

## 2022-12-13 NOTE — Telephone Encounter (Signed)
Prescription sent to pharmacy.

## 2022-12-14 ENCOUNTER — Other Ambulatory Visit: Payer: Self-pay | Admitting: *Deleted

## 2023-01-04 ENCOUNTER — Other Ambulatory Visit: Payer: Self-pay

## 2023-01-04 MED ORDER — BREZTRI AEROSPHERE 160-9-4.8 MCG/ACT IN AERO: 2.0000 | INHALATION_SPRAY | Freq: Two times a day (BID) | RESPIRATORY_TRACT | 0 refills | Status: DC

## 2023-01-29 ENCOUNTER — Ambulatory Visit: Payer: Medicaid Other | Admitting: Cardiology

## 2023-02-06 ENCOUNTER — Other Ambulatory Visit: Payer: Self-pay | Admitting: Internal Medicine

## 2023-02-20 ENCOUNTER — Other Ambulatory Visit: Payer: Self-pay | Admitting: Cardiology

## 2023-02-20 NOTE — Telephone Encounter (Signed)
MUST KEEP APPT FOR FUTURE REFILLS 

## 2023-02-21 NOTE — Progress Notes (Unsigned)
James Huynh, male    DOB: 07/11/1961   MRN: [id number]   Brief patient profile:  62  yowm  MM/Quit smoking (and inhaling) cigars  01/2021 p already stopped cigs in 1992 due to  dx of  asthma rx albuterol and more albuterol up to 4 inhalers  per month and much better after a week of prednisone referred to pulmonary clinic in [facility]  02/13/2021 by James Harding, PA   Born at [facility] with "need for trach before age [age]" but "parents refused" then subsequenly  no problem age 62 then episodic  facial swelling  and difficulty breathing eval by "[facility]"  allergist in [place] > allergic to everything"  took shots until age 62 still problems in spring mostly rhinitis not asthma until 1992.       History of Present Illness  02/13/2021  Pulmonary/ 1st office eval/ James Huynh / [place] [facility]  Chief Complaint  Patient presents with   Pulmonary Consult    Referred by James Harding, PA. Pt c/o SOB for the past 2 years worse over the past 2 wks. Pt states that he always feels like he is unable to take a deep enough breath, feels better when he yawns. He is using his albuterol several times per day.   Dyspnea: limited by R radicular back pain > sob  Cough: sporadic /no rhinitis at initial eval or noct cough  Sleep: no problem noct lying flat SABA use: way too much  rec Plan A = Automatic = Always=    Symbicort 160 Take 2 puffs first thing in am and then another 2 puffs about 12 hours later.  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 Patient states he has gained 20 pounds. Had a heart attack in April 2023.   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   Plan B = Backup (to supplement plan A, not to replace it) Only use your albuterol inhaler as a rescue medication Plan C = Crisis (instead of Plan B but only if Plan B stops working) - only use your albuterol nebulizer if you first try Plan B   To get the most out of exercise, you need to be continuously  aware that you are short of breath, but never out of breath, for at least 30 minutes daily.  Make sure you check your oxygen saturations at highest level of activity -  goal s above 90%     08/24/2022  f/u ov/[place] office/James Huynh re: GOLD 2 COPD  maint on breztri  / still over using saba  Chief Complaint  Patient presents with   Follow-up    Having bad back pain  Breathing is about the same since last ov   Dyspnea:  Not limited by breathing from desired activities   Cough: in am clear mostly  Sleeping: flat bed/ dog in bedroom  no wheeze  SABA use: down to 1.5 inhalers per month/ rarely neb  02: none  Covid status: none  infected x one  Lung cancer screening: in program  Rec No change in recommendations  Only use your albuterol as a rescue medication Also  Ok to try albuterol 15 min before an activity (on alternating days)  that you know would usually make you short of breath    02/22/2023  f/u  ov/[place] office/James Huynh re: GOLD 2 COPD /AB maint on Breztri last pred end of Jan 2024 for cough esp hs   Chief Complaint  Patient presents with   Follow-up  Dyspnea:   back stops him before his breathing Cough: clear mucus esp in am and hs  Sleeping: bed is flat/ one pillow  SABA use: saba 3-4 x per day much less on  prednisone  02: none    No obvious day to day or daytime variability or assoc excess/ purulent sputum or mucus plugs or hemoptysis or cp or chest tightness, subjective wheeze or overt sinus or hb symptoms.     Also denies any obvious fluctuation of symptoms with weather or environmental changes or other aggravating or alleviating factors except as outlined above   No unusual exposure hx or h/o childhood pna/ asthma or knowledge of premature birth.  Current Allergies, Complete Past Medical History, Past Surgical History, Family History, and Social History were reviewed in [employer] record.  ROS  The following are not active complaints unless bolded Hoarseness, sore throat, dysphagia, dental problems, itching, sneezing,  nasal congestion or discharge of excess mucus or purulent secretions, ear ache,   fever, chills, sweats, unintended wt loss or wt gain, classically pleuritic or exertional cp,  orthopnea pnd or arm/hand swelling  or leg swelling, presyncope, palpitations, abdominal pain, anorexia, nausea, vomiting, diarrhea  or change in bowel habits or change in bladder habits, change in stools or change in urine, dysuria, hematuria,  rash, arthralgias/chronic back pain , visual complaints, headache, numbness, weakness or ataxia or problems with walking or coordination,  change in mood or  memory.        Current Meds  Medication Sig   albuterol (PROVENTIL) (2.5 MG/3ML) 0.083% nebulizer solution Take 2.5 mg by nebulization every 6 (six) hours as needed for wheezing or shortness of breath.   albuterol (VENTOLIN HFA) 108 (90 Base) MCG/ACT inhaler Inhale 2  puffs into the lungs every 6 (six) hours as needed for wheezing or shortness of breath.   aspirin EC 81 MG tablet Take 81 mg by mouth every evening.   benzonatate (TESSALON) 100 MG capsule Take 1 capsule (100 mg total) by mouth every 8 (eight) hours.   Budeson-Glycopyrrol-Formoterol (BREZTRI AEROSPHERE) 160-9-4.8 MCG/ACT AERO Inhale 2 puffs into the lungs 2 (two) times daily.   Cholecalciferol (VITAMIN D3 PO) Take 1 tablet by  mouth in the morning.   clindamycin (CLEOCIN) 150 MG capsule Take 150 mg by mouth 4 (four) times daily.   ezetimibe (ZETIA) 10 MG tablet TAKE 1 TABLET BY MOUTH DAILY   furosemide (LASIX) 40 MG tablet Take 1 tablet (40 mg total) by mouth daily.   levothyroxine (SYNTHROID) 150 MCG tablet Take 150 mcg by mouth daily.   metoprolol succinate (TOPROL-XL) 25 MG 24 hr tablet Take 0.5 tablets (12.5 mg total) by mouth at bedtime.   metroNIDAZOLE (FLAGYL) 500 MG tablet Take 1 tablet (500 mg total) by mouth 2 (two) times daily.   nitroGLYCERIN (NITROSTAT) 0.4 MG SL tablet PLACE 1 TABLET UNDER THE TONGUE EVERY 5 MIN UP TO 3 TIMES AS NEEDED FOR CHEST PAIN   omeprazole (PRILOSEC) 40 MG capsule TAKE ONE CAPSULE BY MOUTH ONCE DAILY   promethazine-dextromethorphan (PROMETHAZINE-DM) 6.25-15 MG/5ML syrup Take 5 mLs by mouth 4 (four) times daily as needed for cough.   rosuvastatin (CRESTOR) 40 MG tablet Take 1 tablet (40 mg total) by mouth at bedtime.   ticagrelor (BRILINTA) 90 MG TABS tablet Take 1 tablet (90 mg total) by mouth 2 (two) times daily.   [DISCONTINUED] predniSONE (DELTASONE) 50 MG tablet Take 1 tablet daily with breakfast for the next 5 days.             Past Medical History:  Diagnosis Date   Asthma    CAD (coronary artery disease)    cath 09/09/2015 20% prox RCA, 80% prox to mid LCx treated with DES (3.518 mm Xience Alpine DES), 80% mid LAD lesion treated with 3.518 mm Xience Alpine DES stent postdilated to 4.2.   Chest pain    a. negative stress echo August 2011    GERD (gastroesophageal reflux disease)    High cholesterol    History of pneumonia    a. 2014.   Hypothyroidism    Morbid obesity (HCC)    Tobacco abuse       Objective:    Wts  02/22/2023       245 08/24/2022     251  07/19/2022       250  02/19/2022         248  08/14/2021     230   03/17/21 234 lb 9.6 oz (106.4 kg)  02/13/21 234 lb (106.1 kg)  01/17/21 236 lb 3.2 oz (107.1 kg)    Vital signs reviewed  02/22/2023  - Note at rest 02 sats  93% on RA   General appearance:    amb wm nad   HEENT : Oropharynx  clear   NECK :  without  apparent JVD/ palpable Nodes/TM    LUNGS: no acc muscle use,  Mild barrel  contour chest wall with bilateral  Distant bs s audible wheeze and  without cough on insp or exp maneuvers  and mild  Hyperresonant  to  percussion bilaterally     CV:  RRR  no s3 or murmur or increase in P2, and no edema   ABD:  soft and nontender with pos end  insp Hoover's  in the supine position.  No bruits or organomegaly appreciated   MS:  Nl gait/ ext warm without deformities Or obvious joint restrictions  calf tenderness, cyanosis or clubbing     SKIN: warm and dry without lesions    NEURO:  alert, approp, nl sensorium with  no motor or cerebellar deficits apparent.        I personally reviewed images and agree with  radiology impression as follows:   Chest LDSCT     12/04/22 1. Lung-RADS 2, benign appearance or behavior. Continue annual screening with low-dose chest CT without contrast in 12 months. 2. There is aortic atherosclerosis, in addition to left anterior descending, left circumflex and right coronary artery stents. 3. Mild diffuse bronchial wall thickening with mild centrilobular and paraseptal emphysema; imaging findings suggestive of underlying COPD. Aortic Atherosclerosis (ICD10-I70.0) and Emphysema (ICD10-J43.9).        Assessment

## 2023-02-22 ENCOUNTER — Ambulatory Visit: Payer: Medicaid Other | Admitting: Internal Medicine

## 2023-02-22 ENCOUNTER — Encounter: Payer: Self-pay | Admitting: Internal Medicine

## 2023-02-22 VITALS — BP 93/62 | HR 73 | Temp 97.9°F | Ht 70.0 in | Wt 245.0 lb

## 2023-02-22 DIAGNOSIS — R058 Other specified cough: Secondary | ICD-10-CM

## 2023-02-22 DIAGNOSIS — Z87891 Personal history of nicotine dependence: Secondary | ICD-10-CM | POA: Diagnosis not present

## 2023-02-22 DIAGNOSIS — J449 Chronic obstructive pulmonary disease, unspecified: Secondary | ICD-10-CM

## 2023-02-22 MED ORDER — FAMOTIDINE 20 MG PO TABS: ORAL_TABLET | ORAL | 11 refills | Status: DC

## 2023-02-22 NOTE — Patient Instructions (Addendum)
Plan A = Automatic = Always=    Breztri Take 2 puffs first thing in am and then another 2 puffs about 12 hours later.   Work on Copy with your inhalers like we worked on today    Plan B = Backup (to supplement plan A, not to replace it) Only use your albuterol inhaler as a rescue medication to be used if you can't catch your breath by resting or doing a relaxed purse lip breathing pattern.  - The less you use it, the better it will work when you need it. - Ok to use the inhaler up to 2 puffs  every 4 hours if you must but call for appointment if use goes up over your usual need - Don't leave home without it !!  (think of it like the spare tire for your car)   Plan C = Crisis (instead of Plan B but only if Plan B stops working) - only use your albuterol nebulizer if you first try Plan B and it fails to help > ok to use the nebulizer up to every 4 hours but if start needing it regularly call for immediate appointment    Add pepcid 20 mg after supper   GERD (REFLUX)  is an extremely common cause of respiratory symptoms just like yours , many times with no obvious heartburn at all.    It can be treated with medication, but also with lifestyle changes including elevation of the head of your bed (ideally with 6 -8inch blocks under the headboard of your bed),  Smoking cessation, avoidance of late meals, excessive alcohol, and avoid fatty foods, chocolate, peppermint, colas, red wine, and acidic juices such as orange juice.  NO MINT OR MENTHOL PRODUCTS SO NO COUGH DROPS  USE SUGARLESS CANDY INSTEAD (Jolley ranchers or Stover's or Life Savers) or even ice chips will also do - the key is to swallow to prevent all throat clearing. NO OIL BASED VITAMINS - use powdered substitutes.  Avoid fish oil when coughing.   For cough mucinex  DM 1200 mg every 12 hours   My office will be contacting you by phone for referral to sinus CT   - if you don't hear back from my office within one week please  call us back or notify us thru MyChart and we'll address it right away.   Please schedule a follow up office visit in 4 weeks, sooner if needed  with all medications /inhalers/ solutions in hand so we can verify exactly what you are taking. This includes all medications from all doctors and over the counters

## 2023-02-23 NOTE — Assessment & Plan Note (Signed)
Quit 01/2021 so eligible for annual screening thru 2037  - done feb yearly starting 2024   RADS 2 / findings reviewed with pt and in program for automatic recall

## 2023-02-23 NOTE — Assessment & Plan Note (Signed)
Quit smoking 01/2021 -  H/o childhood allergies "to everything" on allergy shots age 62 -37  - PFT's 05/08/18  FEV1 2.44 (65 % ) ratio 0.58  p 7 % improvement from saba p ? saba hfa and neb prior to study with DLCO  21.84 (67%) corrects to 3.55 (76%)  for alv volume and FV curve classic mild/mod concavity plus  ERV 35% at wt 231  - 02/13/2021  After extensive coaching inhaler device,  effectiveness =    75% (short ti) > try symbicort 160 2bid and if fails > change to breztri  - 03/17/2021  After extensive coaching inhaler device,  effectiveness =  75%> try   Breztri  2 bid and if not happy with benefits vs cost then back to stiolto 2 pffs each am  -  03/17/2021   Walked RA  approx   300 ft  @ moderate pace  stopped due end of study with sats 98%    - Labs ordered 08/14/2021  :  allergy profile  Eos 0.1/ IgE 103   alpha one AT phenotype  MM level 141  - 08/14/2021   Walked on RA x  3  lap(s) =  approx 439ft @ mod to fast pace, stopped due to end of study, mild sob  with lowest 02 sats 95% at lowest   - 02/19/2022  After extensive coaching inhaler device,  effectiveness =    90% > continue breztri and more approp saba - 07/19/2022   Walked on RA  x  3  lap(s) =  approx 450  ft  @ mod pace, stopped due to end of study  with lowest 02 sats 95% s sob  - 02/22/2023  After extensive coaching inhaler device,  effectiveness =    75% from a baseline of < 50%    Group D (now reclassified as E) in terms of symptom/risk and laba/lama/ICS  therefore appropriate rx at this point >>>  breztri and more approp saba   Re SABA :  I spent extra time with pt today reviewing appropriate use of albuterol for prn use on exertion with the following points: 1) saba is for relief of sob that does not improve by walking a slower pace or resting but rather if the pt does not improve after trying this first. 2) If the pt is convinced, as many are, that saba helps recover from activity faster then it's easy to tell if this is the case by  re-challenging : ie stop, take the inhaler, then p 5 minutes try the exact same activity (intensity of workload) that just caused the symptoms and see if they are substantially diminished or not after saba 3) if there is an activity that reproducibly causes the symptoms, try the saba 15 min before the activity on alternate days   If in fact the saba really does help, then fine to continue to use it prn but advised may need to look closer at the maintenance regimen being used to achieve better control of airways disease with exertion.

## 2023-02-23 NOTE — Assessment & Plan Note (Signed)
08/14/2021 Allergy profile  Eos 0.1/ IgE 103  -  added pecpid 20 mg q pm 02/22/2023  - Sinus CT 02/23/2023 >>>   Cough is actually his main complaint at this point as he's so limited by his back he doesn't really have doe   Likely  this is Upper airway cough syndrome (previously labeled PNDS),  is so named because it's frequently impossible to sort out how much is  CR/sinusitis with freq throat clearing (which can be related to primary GERD)   vs  causing  secondary (" extra esophageal")  GERD from wide swings in gastric pressure that occur with throat clearing, often  promoting self use of mint and menthol lozenges that reduce the lower esophageal sphincter tone and exacerbate the problem further in a cyclical fashion.   These are the same pts (now being labeled as having "irritable larynx syndrome" by some cough centers) who not infrequently have a history of having failed to tolerate ace inhibitors,  dry powder inhalers or biphosphonates or report having atypical/extraesophageal reflux symptoms that don't respond to standard doses of PPI  and are easily confused as having aecopd or asthma flares by even experienced allergists/ pulmonologists (myself included).   Rec Max gerd rx empirically Sinus CT or ENT eval  next (depending on insurance coverage) Depomedrol 120 mg IM for any allergic component related to pollen and consider formal allergy eval   F/u in 4 weeks to regroup with all meds in hand using a trust but verify approach to confirm accurate Medication  Reconciliation The principal here is that until we are certain that the  patients are doing what we've asked, it makes no sense to ask them to do more.          Each maintenance medication was reviewed in detail including emphasizing most importantly the difference between maintenance and prns and under what circumstances the prns are to be triggered using an action plan format where appropriate.  Total time for H and P, chart review,  counseling, reviewing hfa device(s) and generating customized AVS unique to this office visit / same day charting = > 30 min for  refractory respiratory  symptoms of uncertain etiology

## 2023-02-25 ENCOUNTER — Telehealth: Payer: Self-pay | Admitting: Cardiology

## 2023-02-25 ENCOUNTER — Encounter: Payer: Self-pay | Admitting: Cardiology

## 2023-02-25 MED ORDER — METOPROLOL SUCCINATE ER 25 MG PO TB24: 12.5000 mg | ORAL_TABLET | Freq: Every day | ORAL | 0 refills | Status: DC

## 2023-02-25 NOTE — Telephone Encounter (Signed)
Pt c/o medication issue:  1. Name of Medication:   metoprolol succinate (TOPROL-XL) 25 MG 24 hr tablet    2. How are you currently taking this medication (dosage and times per day)?   3. Are you having a reaction (difficulty breathing--STAT)? no  4. What is your medication issue? Pharmacy called in stating they received two refills today from Dr. Antoine Poche  and PCP and they need to confirm correct dosage. Please advise.

## 2023-02-25 NOTE — Telephone Encounter (Signed)
Called the pharmacy, previous medications stated 0.5 tablet after review. I checked with patient who via mychart stated he was taking 0.5 tablet daily.   Pharmacy updated.  Thanks!

## 2023-02-25 NOTE — Telephone Encounter (Signed)
Checking with patient on the dosage, last dosage in office was 25 mg, recent RX sent to pharmacy was 12.5 mg-

## 2023-03-07 ENCOUNTER — Encounter: Payer: Self-pay | Admitting: Internal Medicine

## 2023-03-07 ENCOUNTER — Other Ambulatory Visit: Payer: Self-pay

## 2023-03-07 MED ORDER — OMEPRAZOLE 40 MG PO CPDR: 40.0000 mg | DELAYED_RELEASE_CAPSULE | Freq: Every day | ORAL | 2 refills | Status: DC

## 2023-03-08 ENCOUNTER — Ambulatory Visit (HOSPITAL_COMMUNITY)
Admission: RE | Admit: 2023-03-08 | Discharge: 2023-03-08 | Disposition: A | Discharge status: Home / Self Care | Payer: Medicaid Other | Source: Ambulatory Visit | Attending: Internal Medicine | Admitting: Internal Medicine

## 2023-03-08 DIAGNOSIS — Z87891 Personal history of nicotine dependence: Secondary | ICD-10-CM | POA: Diagnosis present

## 2023-03-17 NOTE — Progress Notes (Unsigned)
  Cardiology Office Note:   Date:  03/20/2023  ID:  James Huynh, DOB [birth year], MRN [id number] PCP: Lovey Newcomer, [place]  [facility] Providers Cardiologist:  Rollene Rotunda, MD     History of Present Illness:   James Huynh is a 62 y.o. male who presents for follow up of CAD.  Cardiac cath 2016 20% proximal RCA, 80% proximal to mid circumflex with DES, 80% mid LAD with DES.  He was hospitalized from 01/15/2022-01/18/2022 in the setting of NSTEMI. Echocardiogram showed EF 60 to 65%, no RWMA, no significant valvular abnormalities. Cardiac catheterization on 01/17/2022 showed mLCX 60% (ISR), pLAD 30% (ISR), pRCA 99-0% s/p aspiration thrombectomy, DES, and p-mRCA 20%.   Had no new cardiovascular complaints other than taking nitroglycerin about 6 weeks ago x 1 for an episode of chest discomfort.  He is not able to do as much because he has some chronic lung disease and chronic back pain.  But with his usual activities he is denying any chest pressure, neck or arm discomfort.  He has had none of the discomfort that he had when he presented in April of last year.  He has had no new PND or orthopnea.  Is not having any palpitations, presyncope or syncope.  He said no edema.  He is trying to lose a little weight.  ROS: As stated in the HPI and negative for all other systems.  Studies Reviewed:    EKG: Sinus rhythm, rate 75, axis within normal limits, intervals within normal limits, no acute ST-T wave changes.    Risk Assessment/Calculations:           Physical Exam:   VS:  BP 104/80   Pulse 80   Ht 5\' 10"  (1.778 m)   Wt 246 lb (111.6 kg)   BMI 35.30 kg/m    Wt Readings from Last 3 Encounters:  03/20/23 246 lb (111.6 kg)  02/22/23 245 lb (111.1 kg)  09/12/22 243 lb (110.2 kg)     GEN: Well nourished, well developed in no acute distress NECK: No JVD; No carotid bruits CARDIAC: RRR, no murmurs, rubs, gallops RESPIRATORY:  Clear to auscultation without rales, wheezing or  rhonchi  ABDOMEN: Soft, non-tender, non-distended EXTREMITIES:  No edema; No deformity   ASSESSMENT AND PLAN:   CAD in native artery :   The patient has no new sypmtoms.  No further cardiovascular testing is indicated.  We will continue with aggressive risk reduction and meds as listed.  He can stop his Brilinta.  He will continue aspirin.  Hyperlipidemia : LDL 33 but I do not have the most recent ones.  He understands the goal is less than 50.   Chronic obstructive pulmonary disease, unspecified COPD type (HCC): No change in therapy.  He can be followed by his primary doctor.    Morbid obesity (HCC):   Unfortunately he is not able to exercise but we talked about diet.  It would be interesting if he has an A1c above 6.5 (previously 6.1) to see if we could get him GLP-1 receptor agonist.       Signed, Rollene Rotunda, MD

## 2023-03-20 ENCOUNTER — Encounter: Payer: Self-pay | Admitting: Cardiology

## 2023-03-20 ENCOUNTER — Ambulatory Visit (INDEPENDENT_AMBULATORY_CARE_PROVIDER_SITE_OTHER): Payer: Medicaid Other | Admitting: Cardiology

## 2023-03-20 VITALS — BP 104/80 | HR 80 | Ht 70.0 in | Wt 246.0 lb

## 2023-03-20 DIAGNOSIS — E785 Hyperlipidemia, unspecified: Secondary | ICD-10-CM | POA: Diagnosis not present

## 2023-03-20 DIAGNOSIS — J449 Chronic obstructive pulmonary disease, unspecified: Secondary | ICD-10-CM

## 2023-03-20 DIAGNOSIS — I2510 Atherosclerotic heart disease of native coronary artery without angina pectoris: Secondary | ICD-10-CM | POA: Diagnosis not present

## 2023-03-20 NOTE — Patient Instructions (Signed)
Medication Instructions:  Please discontinue your Brilinta. Continue all other medications as listed.  *If you need a refill on your cardiac medications before your next appointment, please call your pharmacy*  Follow-Up: At  [facility], you and your health needs are our priority.  As part of our continuing mission to provide you with exceptional heart care, we have created designated Provider Care Teams.  These Care Teams include your primary Cardiologist (physician) and Advanced Practice Providers (APPs -  Physician Assistants and Nurse Practitioners) who all work together to provide you with the care you need, when you need it.  We recommend signing up for the patient portal called "MyChart".  Sign up information is provided on this After Visit Summary.  MyChart is used to connect with patients for Virtual Visits (Telemedicine).  Patients are able to view lab/test results, encounter notes, upcoming appointments, etc.  Non-urgent messages can be sent to your provider as well.   To learn more about what you can do with MyChart, go to [email or web address].    Your next appointment:   1 year(s)  Provider:   Rollene Rotunda, MD

## 2023-04-01 IMAGING — DX DG CHEST 2V
2 series · 2 of 2 positions shown · non-contrast
Comparison: Comparison made with December 20, 2020.

CLINICAL DATA: A 60-year-old male.

EXAM:
CHEST - 2 VIEW

[chest pa]
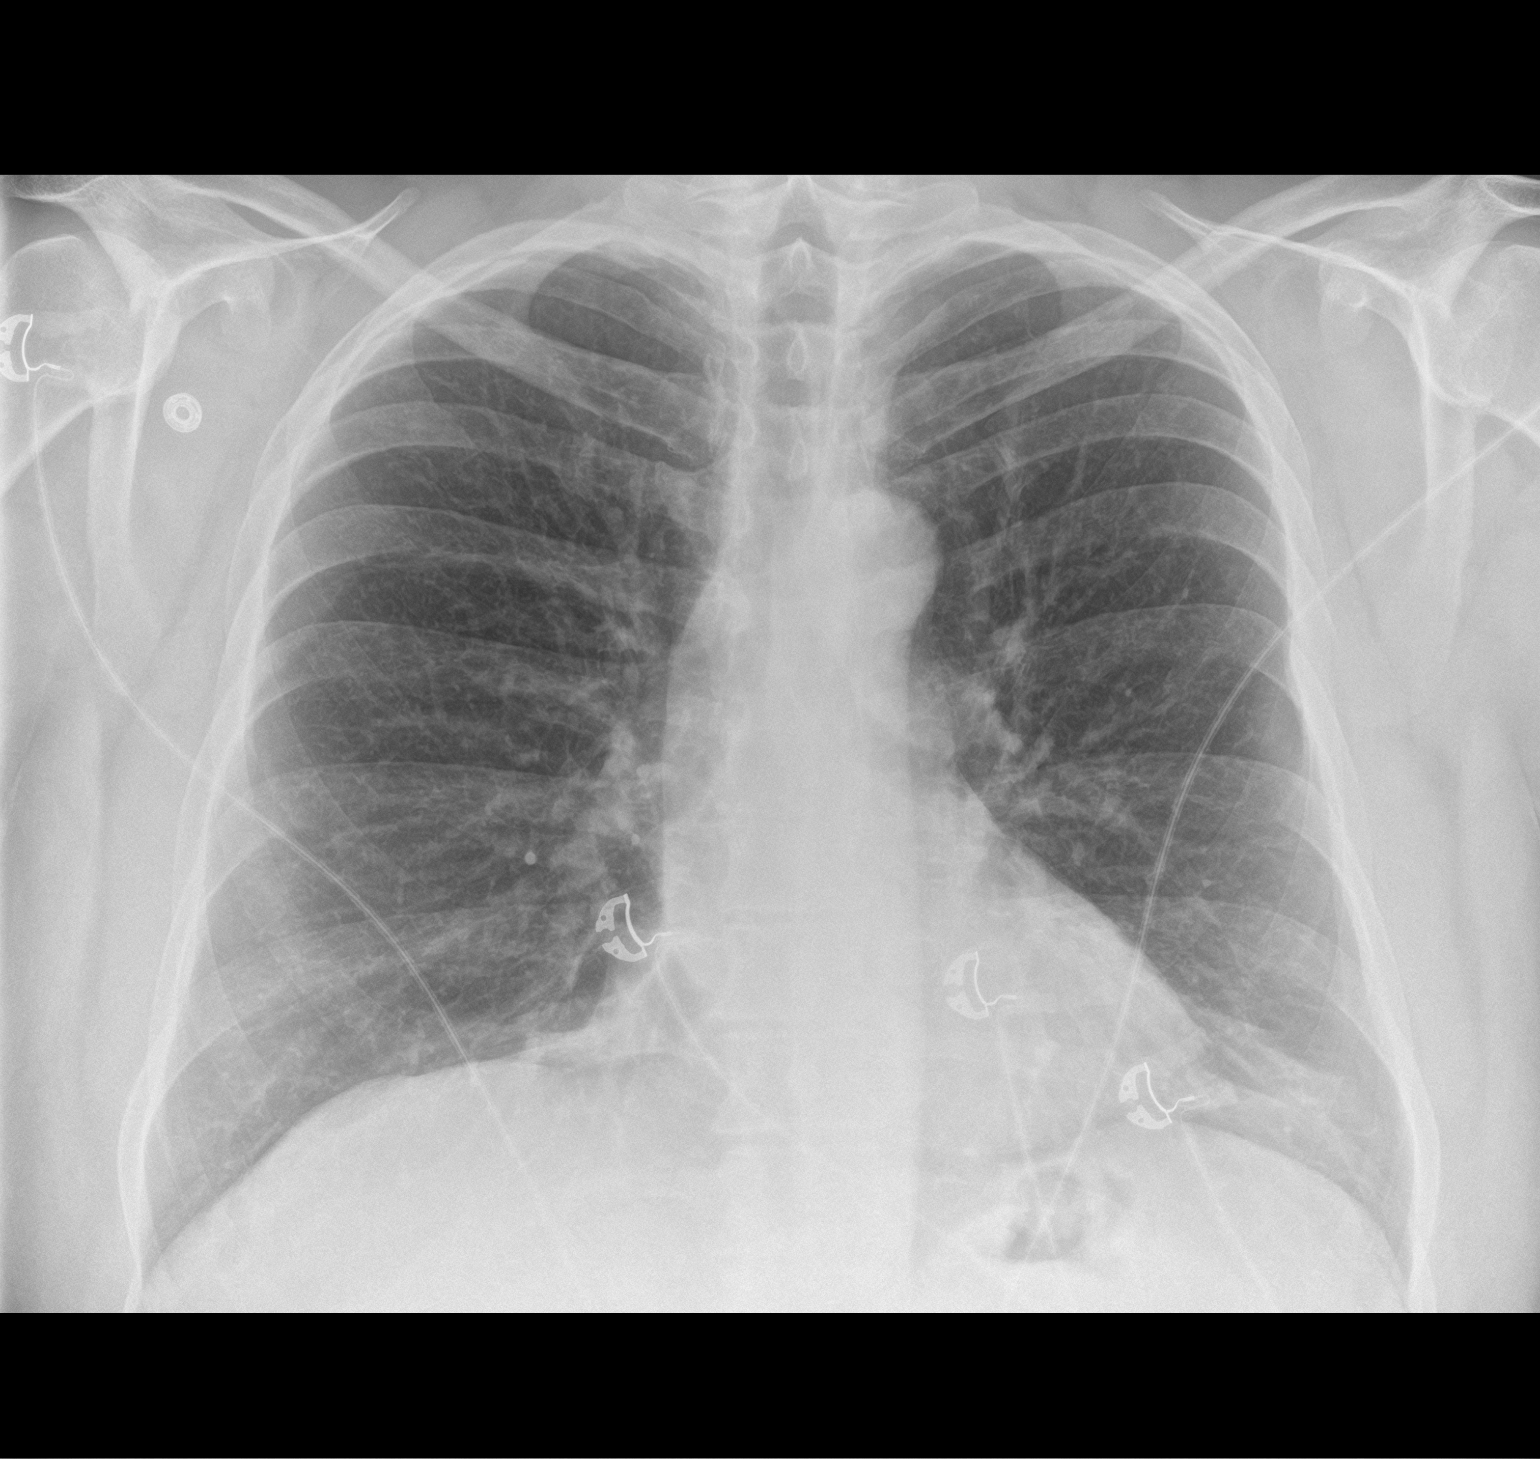

[chest lat]
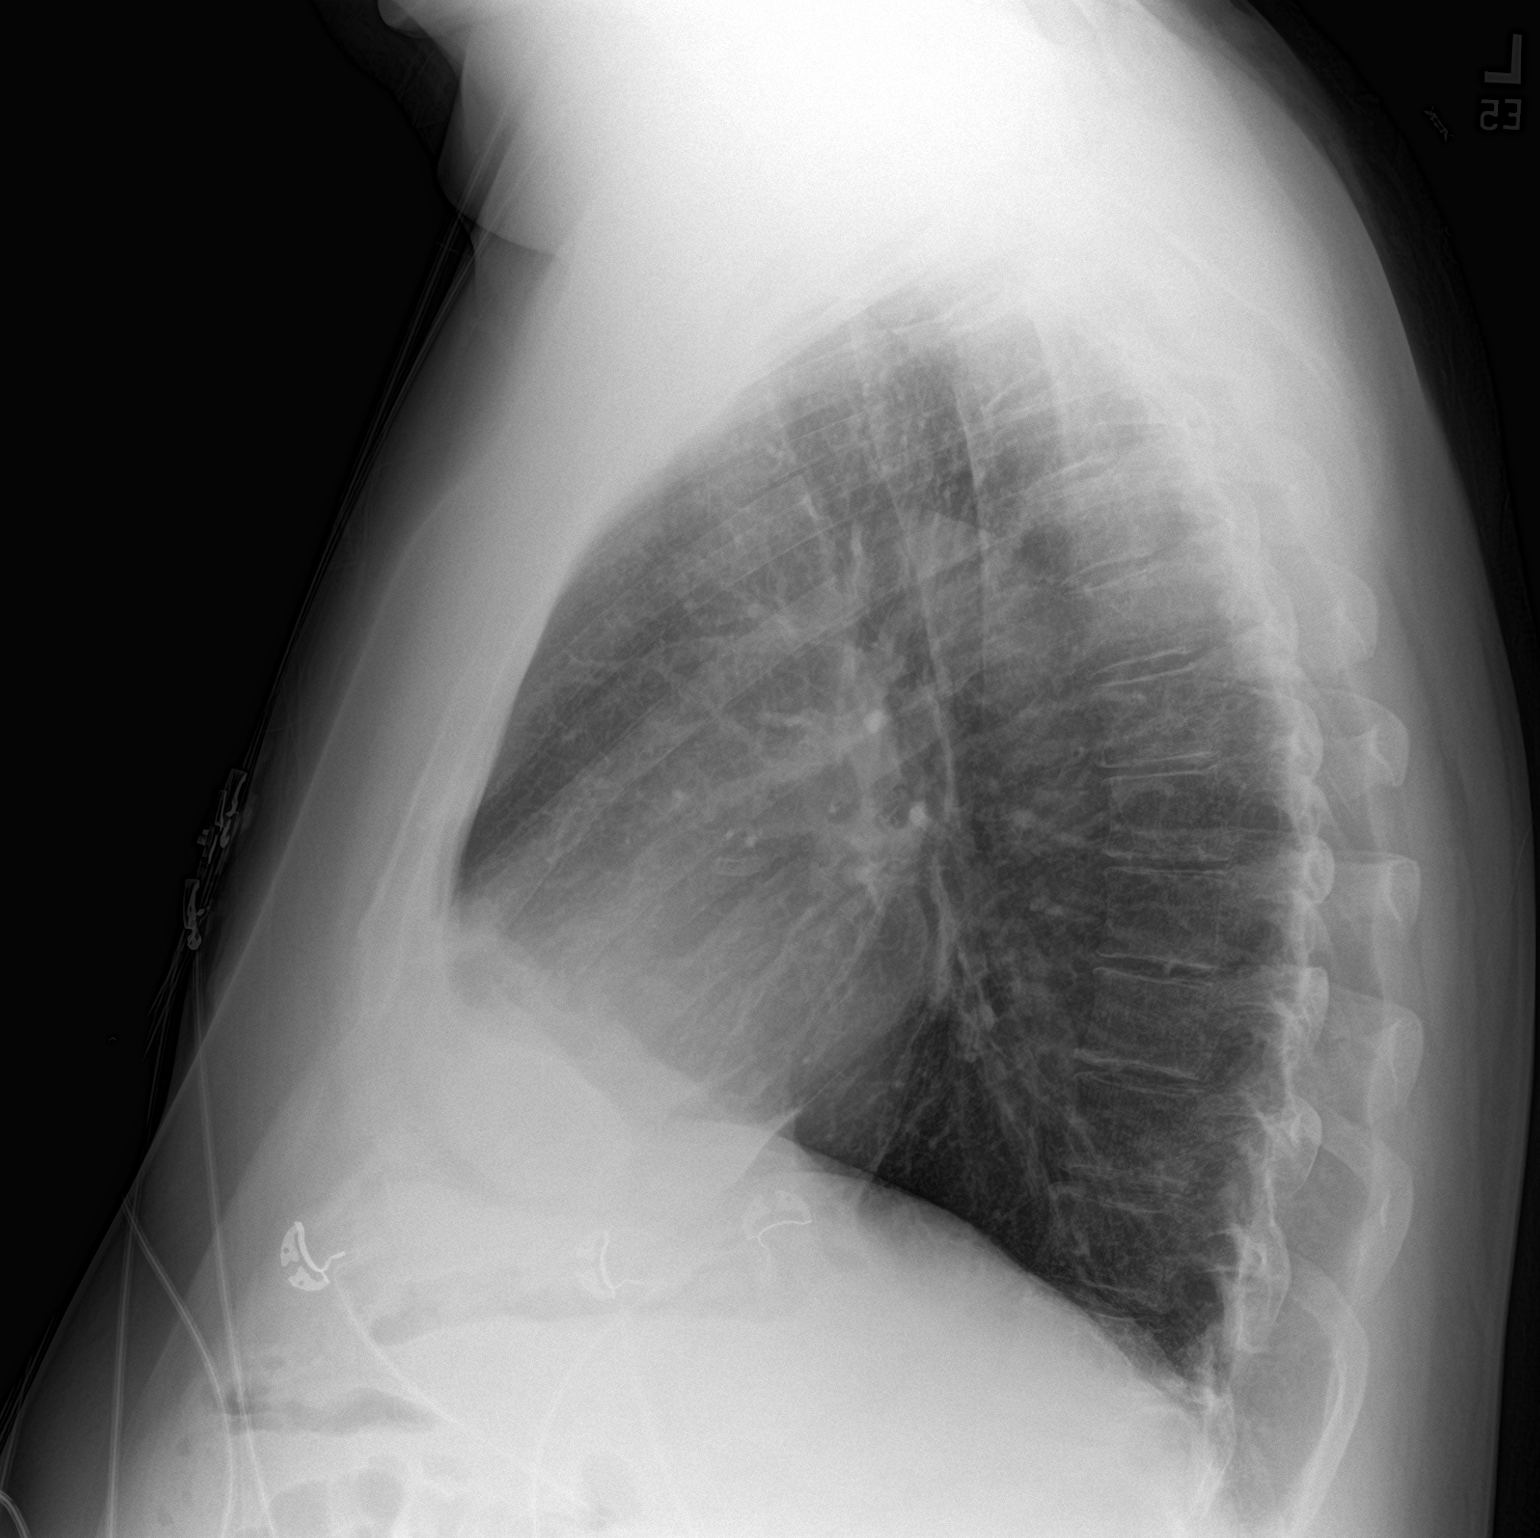

[2 of 2 positions shown; findings below may reference images not displayed]

FINDINGS: Trachea midline.

Cardiomediastinal contours and hilar structures are normal.

No lobar consolidation. No visible pneumothorax. No sign of pleural
effusion.

Slight added density along the LEFT heart border may been present
previously, suspect this represents prominence of epicardial fat.
Mild lingular scarring is also present based on previous CT of the
chest.

On limited assessment there is no acute skeletal process. EKG leads
project over the chest.
IMPRESSION: No active cardiopulmonary disease.

## 2023-04-07 NOTE — Progress Notes (Unsigned)
James Huynh, male    DOB: 07/11/1961   MRN: [id number]   Brief patient profile:  62  yowm  MM/Quit smoking (and inhaling) cigars  01/2021 p already stopped cigs in 1992 due to  dx of  asthma rx albuterol and more albuterol up to 4 inhalers  per month and much better after a week of prednisone referred to pulmonary clinic in [facility]  02/13/2021 by [name], PA   Born at [facility] with "need for trach before age [age]" but "parents refused" then subsequenly  no problem age 22 then episodic  facial swelling  and difficulty breathing eval by "[facility]"  allergist in [place] > allergic to everything"  took shots until age 54 still problems in spring mostly rhinitis not asthma until 1992.       History of Present Illness  02/13/2021  Pulmonary/ 1st office eval/ [facility] / [place] Office  Chief Complaint  Patient presents with   Pulmonary Consult    Referred by [name], PA. Pt c/o SOB for the past 2 years worse over the past 2 wks. Pt states that he always feels like he is unable to take a deep enough breath, feels better when he yawns. He is using his albuterol several times per day.   Dyspnea: limited by R radicular back pain > sob  Cough: sporadic /no rhinitis at initial eval or noct cough  Sleep: no problem noct lying flat SABA use: way too much  rec Plan A = Automatic = Always=    Symbicort 160 Take 2 puffs first thing in am and then another 2 puffs about 12 hours later.  Work on inhaler technique:    Plan B = Backup (to supplement plan A, not to replace it) Only use your albuterol inhaler as a rescue medication Plan C = Crisis (instead of Plan B but only if Plan B stops working) - only use your albuterol nebulizer if you first try Plan B  Omeprazole should be 40 mg  Take 30-60 min before first meal of the day  GERD diet/ lifestyle rx    08/14/2021  Labs: Allergy profile  Eos 0.1/ IgE 103   alpha one AT phenotype  MM level 141    02/19/2022  f/u ov/[name]  office/Jissell Trafton re: GOLD 2 vs ACOS maint on breztri  Chief Complaint  Patient presents with   Follow-up    Cough has improved but SOB is still present. Patient states he has gained 20 pounds. Had a heart attack in April 2023.   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   Plan B = Backup (to supplement plan A, not to replace it) Only use your albuterol inhaler as a rescue medication Plan C = Crisis (instead of Plan B but only if Plan B stops working) - only use your albuterol nebulizer if you first try Plan B   To get the most out of exercise, you need to be continuously  aware that you are short of breath, but never out of breath, for at least 30 minutes daily.  Make sure you check your oxygen saturations at highest level of activity -  goal s above 90%     08/24/2022  f/u ov/[place] office/Rehema Muffley re: GOLD 2 COPD  maint on breztri  / still over using saba  Chief Complaint  Patient presents with   Follow-up    Having bad back pain  Breathing is about the same since last ov   Dyspnea:  Not limited by breathing from desired activities   Cough: in am clear mostly  Sleeping: flat bed/ dog in bedroom  no wheeze  SABA use: down to 1.5 inhalers per month/ rarely neb  02: none  Covid status: none  infected x one  Lung cancer screening: in program  Rec No change in recommendations  Only use your albuterol as a rescue medication Also  Ok to try albuterol 15 min before an activity (on alternating days)  that you know would usually make you short of breath    02/22/2023  f/u  ov/[place] office/Prosperity Darrough re: GOLD 2 COPD /AB maint on Breztri last pred end of Jan 2024 for cough esp hs   Chief Complaint  Patient presents with   Follow-up  Dyspnea:   back stops him before his breathing Cough: clear mucus esp in am and hs  Sleeping: bed is flat/ one pillow  SABA use: saba 3-4 x per day much less on  prednisone  02: none  Rec Plan A = Automatic = Always=    Breztri  Work on perfect technique with your inhalers like we worked on today   Plan B = Backup (to supplement plan A, not to replace it) Only use your albuterol inhaler as a rescue medication  Plan C = Crisis (instead of Plan B but only if Plan B stops working) - only use your albuterol nebulizer if you first try Plan B  Add pepcid 20 mg after supper  GERD diet reviewed, bed blocks rec   For cough mucinex  DM 1200 mg every 12 hours  referral to sinus CT  >nl  Please schedule a follow up office visit in 4 weeks, sooner if needed  with all medications /inhalers/ solutions in hand     04/08/2023  f/u ov/[place] office/[place] re: *** maint on *** did *** bring meds  No chief complaint on file.   Dyspnea:  *** Cough: *** Sleeping: *** SABA use: *** 02: *** Covid status: *** Lung cancer screening: ***   No obvious day to day or daytime variability or assoc excess/ purulent sputum or mucus plugs or hemoptysis or cp or chest tightness, subjective wheeze or overt sinus or hb symptoms.   *** without nocturnal  or early am exacerbation  of respiratory  c/o's or need for noct saba. Also denies any obvious fluctuation of symptoms with weather or environmental changes or other aggravating or alleviating factors except as outlined above   No unusual exposure hx or h/o childhood pna/ asthma or knowledge of premature birth.  Current Allergies, Complete Past Medical History, Past Surgical History, Family History, and Social History were reviewed in [employer] record.  ROS  The following are  not active complaints unless bolded Hoarseness, sore throat, dysphagia, dental problems, itching, sneezing,  nasal congestion or discharge of excess mucus or purulent secretions, ear ache,   fever, chills, sweats, unintended wt loss or wt gain, classically pleuritic or exertional cp,  orthopnea pnd or arm/hand swelling  or leg swelling, presyncope, palpitations, abdominal pain, anorexia, nausea, vomiting, diarrhea  or change in bowel habits or change in bladder habits, change in stools or change in urine, dysuria, hematuria,  rash, arthralgias, visual complaints, headache, numbness, weakness or ataxia or problems with walking or coordination,  change in mood or  memory.        No outpatient medications have been marked as taking for the 04/08/23 encounter (Appointment) with Nyoka Cowden, MD.              Past Medical History:  Diagnosis Date   Asthma    CAD (coronary artery disease)    cath 09/09/2015 20% prox RCA, 80% prox to mid LCx treated with DES (3.518 mm Xience Alpine DES), 80% mid LAD lesion treated with 3.518 mm Xience Alpine DES stent postdilated to 4.2.   Chest pain    a. negative stress echo August 2011   GERD (gastroesophageal reflux disease)    High cholesterol    History of pneumonia    a. 2014.   Hypothyroidism    Morbid obesity (HCC)    Tobacco abuse       Objective:    Wts  04/08/2023       ***  02/22/2023       245 08/24/2022     251  07/19/2022       250  02/19/2022         248  08/14/2021     230   03/17/21 234 lb 9.6 oz (106.4 kg)  02/13/21 234 lb (106.1 kg)  01/17/21 236 lb 3.2 oz (107.1 kg)    Vital signs reviewed  04/08/2023  - Note at rest 02 sats  ***% on ***   General appearance:    ***    Mild bar***    I personally reviewed images and agree with radiology impression as follows:   Chest LDSCT     12/04/22 1. Lung-RADS 2, benign appearance or behavior. Continue annual screening with low-dose chest CT without contrast in 12 months. 2. There  is aortic atherosclerosis, in addition to left anterior descending, left circumflex and right coronary artery stents. 3. Mild diffuse bronchial wall thickening with mild centrilobular and paraseptal emphysema; imaging findings suggestive of underlying COPD. Aortic Atherosclerosis (ICD10-I70.0) and Emphysema (ICD10-J43.9).        Assessment

## 2023-04-08 ENCOUNTER — Encounter: Payer: Self-pay | Admitting: Cardiology

## 2023-04-08 ENCOUNTER — Ambulatory Visit (INDEPENDENT_AMBULATORY_CARE_PROVIDER_SITE_OTHER): Payer: Medicaid Other | Admitting: Internal Medicine

## 2023-04-08 ENCOUNTER — Other Ambulatory Visit: Payer: Self-pay | Admitting: Cardiology

## 2023-04-08 ENCOUNTER — Encounter: Payer: Self-pay | Admitting: Internal Medicine

## 2023-04-08 VITALS — BP 90/60 | HR 66 | Ht 70.0 in | Wt 250.4 lb

## 2023-04-08 DIAGNOSIS — Z87891 Personal history of nicotine dependence: Secondary | ICD-10-CM | POA: Diagnosis not present

## 2023-04-08 DIAGNOSIS — R058 Other specified cough: Secondary | ICD-10-CM

## 2023-04-08 DIAGNOSIS — J449 Chronic obstructive pulmonary disease, unspecified: Secondary | ICD-10-CM

## 2023-04-08 MED ORDER — ROSUVASTATIN CALCIUM 40 MG PO TABS: 40.0000 mg | ORAL_TABLET | Freq: Every day | ORAL | 3 refills | Status: DC

## 2023-04-08 NOTE — Patient Instructions (Signed)
No change in medications   Also  Ok to try albuterol x 2 pffs x  15 min before an activity (on alternating days)  that you know would usually make you short of breath and see if it makes any difference and if makes none then don't take albuterol after activity unless you can't catch your breath as this means it's the resting that helps, not the albuterol.   Please schedule a follow up visit in 6 months but call sooner if needed

## 2023-04-08 NOTE — Assessment & Plan Note (Signed)
Onset of rhinitis/sinusitis as child/ no better on allergy shots - totally free of symptoms 2007 - 2020 then exp to indoor cat  - 08/14/2021 Allergy profile  Eos 0.1/ IgE 103  -  added pecpid 20 mg q pm 02/22/2023  - Sinus CT 03/08/2023 >>> nl   Adequate control on present rx, reviewed in detail with pt > no change in rx needed

## 2023-04-08 NOTE — Assessment & Plan Note (Addendum)
Quit 01/2021 so eligible for annual screening thru 2037  - done feb yearly starting 2024   Discussed in detail all the  indications, usual  risks and alternatives  relative to the benefits with patient who agrees to continue f/u as directed by lung cancer screening program         Each maintenance medication was reviewed in detail including emphasizing most importantly the difference between maintenance and prns and under what circumstances the prns are to be triggered using an action plan format where appropriate.  Total time for H and P, chart review, counseling,  and generating customized AVS unique to this office visit / same day charting  > 30 min

## 2023-04-08 NOTE — Assessment & Plan Note (Addendum)
Quit smoking 01/2021 -  H/o childhood allergies "to everything" on allergy shots age 62 -38  - PFT's 05/08/18  FEV1 2.44 (65 % ) ratio 0.58  p 7 % improvement from saba p ? saba hfa and neb prior to study with DLCO  21.84 (67%) corrects to 3.55 (76%)  for alv volume and FV curve classic mild/mod concavity plus  ERV 35% at wt 231  - 02/13/2021  After extensive coaching inhaler device,  effectiveness =    75% (short ti) > try symbicort 160 2bid and if fails > change to breztri  - 03/17/2021  After extensive coaching inhaler device,  effectiveness =  75%> try   Breztri  2 bid and if not happy with benefits vs cost then back to stiolto 2 pffs each am  -  03/17/2021   Walked RA  approx   300 ft  @ moderate pace  stopped due end of study with sats 98%    - Labs ordered 08/14/2021  :  allergy profile  Eos 0.1/ IgE 103   alpha one AT phenotype  MM level 141  - 08/14/2021   Walked on RA x  3  lap(s) =  approx 43ft @ mod to fast pace, stopped due to end of study, mild sob  with lowest 02 sats 95% at lowest   - 02/19/2022  After extensive coaching inhaler device,  effectiveness =    90% > continue breztri and more approp saba - 07/19/2022   Walked on RA  x  3  lap(s) =  approx 450  ft  @ mod pace, stopped due to end of study  with lowest 02 sats 95% s sob  - 04/08/2023  After extensive coaching inhaler device,  effectiveness =    90%     Group D (now reclassified as E) in terms of symptom/risk and laba/lama/ICS  therefore appropriate rx at this point >>>  breztri plus more approp saba:  Re SABA :  I spent extra time with pt today reviewing appropriate use of albuterol for prn use on exertion with the following points: 1) saba is for relief of sob that does not improve by walking a slower pace or resting but rather if the pt does not improve after trying this first. 2) If the pt is convinced, as many are, that saba helps recover from activity faster then it's easy to tell if this is the case by re-challenging : ie stop,  take the inhaler, then p 5 minutes try the exact same activity (intensity of workload) that just caused the symptoms and see if they are substantially diminished or not after saba 3) if there is an activity that reproducibly causes the symptoms, try the saba 15 min before the activity on alternate days   If in fact the saba really does help, then fine to continue to use it prn but advised may need to look closer at the maintenance regimen being used to achieve better control of airways disease with exertion.     F/u q 6 m, sooner if needed

## 2023-04-17 ENCOUNTER — Other Ambulatory Visit: Payer: Self-pay

## 2023-04-17 LAB — LAB REPORT - SCANNED
A1c: 6.6
EGFR: 64.6

## 2023-04-17 MED ORDER — BREZTRI AEROSPHERE 160-9-4.8 MCG/ACT IN AERO: 2.0000 | INHALATION_SPRAY | Freq: Two times a day (BID) | RESPIRATORY_TRACT | 0 refills | Status: DC

## 2023-04-24 ENCOUNTER — Other Ambulatory Visit (HOSPITAL_COMMUNITY): Payer: Self-pay | Admitting: Physician Assistant

## 2023-04-24 ENCOUNTER — Telehealth: Payer: Self-pay | Admitting: *Deleted

## 2023-04-24 DIAGNOSIS — E876 Hypokalemia: Secondary | ICD-10-CM

## 2023-04-24 DIAGNOSIS — R1033 Periumbilical pain: Secondary | ICD-10-CM

## 2023-04-24 DIAGNOSIS — K429 Umbilical hernia without obstruction or gangrene: Secondary | ICD-10-CM

## 2023-04-24 MED ORDER — POTASSIUM CHLORIDE ER 10 MEQ PO TBCR: 20.0000 meq | EXTENDED_RELEASE_TABLET | Freq: Every day | ORAL | 3 refills | Status: DC

## 2023-04-24 NOTE — Telephone Encounter (Signed)
-----   Message from Rollene Rotunda, MD sent at 04/24/2023  2:10 PM EDT ----- I would like to start Kdur 20 meq daily and check a BMET in one 10 days.  Call Mr. Grove with the results and send results to Lovey Newcomer, PA

## 2023-04-24 NOTE — Telephone Encounter (Signed)
Spoke with pt, aware of results. New script sent to the pharmacy. He requested the 10 meq size tablet as he has trouble swallowing pills.Lab orders mailed to the pt. He said dr hochrein talked to him about a medication if he were considered diabetic. His hgbA1c on the labs was 6.6 but that was non-fasting. He did go back today and have fasting labs and the result was 6.4. he wanted to know if dr hochrein wanted him to start the new medication for diabetes and the heart. Aware will forward to dr hochrein.

## 2023-04-25 ENCOUNTER — Other Ambulatory Visit (HOSPITAL_COMMUNITY): Payer: Self-pay

## 2023-04-25 NOTE — Telephone Encounter (Signed)
Please call to schedule PharmD appt at [facility] or [facility]

## 2023-04-26 ENCOUNTER — Encounter: Payer: Self-pay | Admitting: Cardiology

## 2023-04-26 ENCOUNTER — Telehealth: Payer: Self-pay | Admitting: *Deleted

## 2023-04-26 DIAGNOSIS — E876 Hypokalemia: Secondary | ICD-10-CM

## 2023-04-26 MED ORDER — POTASSIUM CHLORIDE ER 10 MEQ PO TBCR: 10.0000 meq | EXTENDED_RELEASE_TABLET | Freq: Every day | ORAL | Status: DC

## 2023-04-26 NOTE — Telephone Encounter (Signed)
Patient is aware 

## 2023-04-26 NOTE — Telephone Encounter (Signed)
-----   Message from Rollene Rotunda sent at 04/26/2023  1:09 PM EDT ----- I would suggest 10 meq daily ----- Message ----- From: Freddi Starr, RN Sent: 04/26/2023  11:41 AM EDT To: Rollene Rotunda, MD  This patients labs were faxed to [place] and his potassium was low, he had lab work repeated 2 days later fasting and it is 3.5. there is a screen shot under media of the results. The patient wants to know if he still needs to start potassium.

## 2023-04-26 NOTE — Telephone Encounter (Signed)
Spoke with pt, Follow up scheduled  

## 2023-05-06 ENCOUNTER — Encounter: Payer: Self-pay | Admitting: Cardiology

## 2023-05-06 MED ORDER — EZETIMIBE 10 MG PO TABS: 10.0000 mg | ORAL_TABLET | Freq: Every day | ORAL | 3 refills | Status: DC

## 2023-05-08 ENCOUNTER — Ambulatory Visit (HOSPITAL_COMMUNITY): Payer: Medicaid Other

## 2023-05-08 ENCOUNTER — Encounter (HOSPITAL_COMMUNITY): Payer: Self-pay

## 2023-05-14 ENCOUNTER — Encounter: Payer: Self-pay | Admitting: *Deleted

## 2023-05-21 ENCOUNTER — Encounter: Payer: Self-pay | Admitting: Pharmacist

## 2023-05-21 ENCOUNTER — Telehealth: Payer: Self-pay | Admitting: Pharmacist

## 2023-05-21 ENCOUNTER — Ambulatory Visit: Payer: Medicaid Other | Attending: Internal Medicine | Admitting: Pharmacist

## 2023-05-21 VITALS — Ht 70.0 in | Wt 251.4 lb

## 2023-05-21 DIAGNOSIS — E1169 Type 2 diabetes mellitus with other specified complication: Secondary | ICD-10-CM

## 2023-05-21 DIAGNOSIS — R0609 Other forms of dyspnea: Secondary | ICD-10-CM | POA: Diagnosis not present

## 2023-05-21 DIAGNOSIS — I2510 Atherosclerotic heart disease of native coronary artery without angina pectoris: Secondary | ICD-10-CM | POA: Diagnosis not present

## 2023-05-21 DIAGNOSIS — E119 Type 2 diabetes mellitus without complications: Secondary | ICD-10-CM | POA: Insufficient documentation

## 2023-05-21 MED ORDER — NITROGLYCERIN 0.4 MG SL SUBL: SUBLINGUAL_TABLET | SUBLINGUAL | 1 refills | Status: DC

## 2023-05-21 NOTE — Progress Notes (Signed)
Patient ID: James Huynh                 DOB: [birth year]                    MRN: [id number]     HPI: James Huynh is a 62 y.o. male patient referred to pharmacy clinic by Dr Antoine Poche to initiate GLP1-RA therapy. PMH is significant for NSTEMI, CAD, COPD, T2DM, obesity, smoking, and HLD.  Most recent BMI 36.07.  Patient presents today with wife. Has complex medical history. Unable to exercise due to SOB. Was not able to do dishes yesterday without dyspnea.  Reports he has gained weight recently. IS no longer following a heart healthy diet. Eats ost meals out, frequently at fast food restaurants. Drinks 5 bottles of Sun Drop a day (not diet). Smokes 1/2 ppd/  A1c 6.6 on 04/22/23.  Labs: Lab Results  Component Value Date   HGBA1C 6.1 (H) 01/16/2022    Wt Readings from Last 1 Encounters:  04/08/23 250 lb 6.4 oz (113.6 kg)    BP Readings from Last 1 Encounters:  04/08/23 90/60   Pulse Readings from Last 1 Encounters:  04/08/23 66       Component Value Date/Time   CHOL 90 01/16/2022 0413   TRIG 134 01/16/2022 0413   HDL 30 (L) 01/16/2022 0413   CHOLHDL 3.0 01/16/2022 0413   VLDL 27 01/16/2022 0413   LDLCALC 33 01/16/2022 0413    Past Medical History:  Diagnosis Date   Asthma    Bronchitis    CAD (coronary artery disease)    cath 09/09/2015 20% prox RCA, 80% prox to mid LCx treated with DES (3.518 mm Xience Alpine DES), 80% mid LAD lesion treated with 3.518 mm Xience Alpine DES stent postdilated to 4.2.   Chest pain    a. negative stress echo August 2011   COPD (chronic obstructive pulmonary disease) (HCC)    GERD (gastroesophageal reflux disease)    High cholesterol    History of pneumonia    a. 2014.   Hypothyroidism    Morbid obesity (HCC)    Tobacco abuse     Current Outpatient Medications on File Prior to Visit  Medication Sig Dispense Refill   albuterol (PROVENTIL) (2.5 MG/3ML) 0.083% nebulizer solution Take 2.5 mg by nebulization every 6 (six)  hours as needed for wheezing or shortness of breath.     albuterol (VENTOLIN HFA) 108 (90 Base) MCG/ACT inhaler Inhale 2 puffs into the lungs every 6 (six) hours as needed for wheezing or shortness of breath.     aspirin EC 81 MG tablet Take 81 mg by mouth every evening.     Budeson-Glycopyrrol-Formoterol (BREZTRI AEROSPHERE) 160-9-4.8 MCG/ACT AERO Inhale 2 puffs into the lungs 2 (two) times daily. 10.7 g 0   Cholecalciferol (VITAMIN D3 PO) Take 1 tablet by mouth in the morning.     ezetimibe (ZETIA) 10 MG tablet Take 1 tablet (10 mg total) by mouth daily. 90 tablet 3   famotidine (PEPCID) 20 MG tablet One after supper 30 tablet 11   furosemide (LASIX) 40 MG tablet Take 1 tablet (40 mg total) by mouth daily. 90 tablet 3   levothyroxine (SYNTHROID) 150 MCG tablet Take 150 mcg by mouth daily.     metoprolol succinate (TOPROL-XL) 25 MG 24 hr tablet Take 0.5 tablets (12.5 mg total) by mouth at bedtime. Please make sure to come to your next appt. 90 tablet 0   nitroGLYCERIN (  NITROSTAT) 0.4 MG SL tablet PLACE 1 TABLET UNDER THE TONGUE EVERY 5 MIN UP TO 3 TIMES AS NEEDED FOR CHEST PAIN 25 tablet 1   omeprazole (PRILOSEC) 40 MG capsule Take 1 capsule (40 mg total) by mouth daily. 30 capsule 2   potassium chloride (KLOR-CON) 10 MEQ tablet Take 1 tablet (10 mEq total) by mouth daily.     rosuvastatin (CRESTOR) 40 MG tablet Take 1 tablet (40 mg total) by mouth at bedtime. 90 tablet 3   No current facility-administered medications on file prior to visit.    Allergies  Allergen Reactions   Penicillins Anaphylaxis     Assessment/Plan:  1. Weight loss/CAD/T2DM - Patient with extensive cardiac and PMH and weight gain likely complicating his SOB and DM. Recommend starting GLP1a for CAD, weight loss and blood sugar benefit. Will start Ozempic. Confirmed patient has no personal or family history of medullary thyroid carcinoma (MTC) or Multiple Endocrine Neoplasia syndrome type 2 (MEN 2). Injection technique  reviewed at today's visit.  Using [employer], educated on mechanism of action, storage, site selection, and possible adverse effects. Advised patient on common side effects including nausea, diarrhea, dyspepsia, decreased appetite, and fatigue. Counseled patient on reducing meal size and how to titrate medication to minimize side effects. Counseled patient to call if intolerable side effects or if experiencing dehydration, abdominal pain, or dizziness.   Recommend discontinuing sugary drinks and fast food.  Will complete PA and contact patient when approved  Laural Golden, PharmD, BCACP, CDCES, CPP [address] [place], [place], [address] Phone: [phone], Fax: [phone]

## 2023-05-21 NOTE — Telephone Encounter (Signed)
Please complete PA for Ozempic. Recommend using DM and NSTEMI as diagnosis codes

## 2023-05-21 NOTE — Patient Instructions (Addendum)
It was nice meeting you today  The medication we discussed today is called Ozempic.  Your dose will be 0.25mg  once a week for 4 weeks. On week 5 you will increase to 0.5mg  once a week  I will complete the prior authorization for you and contact you when it is approved  Please message with any questions  Laural Golden, PharmD, BCACP, CDCES, CPP [address], [address] [place], [place], [address] Phone: [phone] Fax: [phone]

## 2023-05-22 ENCOUNTER — Other Ambulatory Visit (HOSPITAL_COMMUNITY): Payer: Self-pay

## 2023-05-22 NOTE — Telephone Encounter (Signed)
   P/a for wegovy has been submitted and is pending   Key: [id number]

## 2023-05-23 ENCOUNTER — Telehealth: Payer: Self-pay

## 2023-05-23 ENCOUNTER — Other Ambulatory Visit (HOSPITAL_COMMUNITY): Payer: Self-pay

## 2023-05-23 DIAGNOSIS — E1169 Type 2 diabetes mellitus with other specified complication: Secondary | ICD-10-CM

## 2023-05-23 NOTE — Telephone Encounter (Signed)
*  please disregard previous msg about ''wegovy""    Pharmacy Patient Advocate Encounter   Received notification from Physician's Office/PharmD [place] that prior authorization for [facility] is required/requested.   Insurance verification completed.   The patient is insured through [facility] .   P/A is Approved from 05/23/23-05/22/24

## 2023-05-23 NOTE — Telephone Encounter (Addendum)
Pharmacy Patient Advocate Encounter   Received notification from Physician's Office/PharmD [place] that prior authorization for [facility] is required/requested.   Insurance verification completed.   The patient is insured through [facility] .   P/A is Approved from 05/23/23-05/22/24

## 2023-05-24 MED ORDER — LANCET DEVICE MISC: 1.0000 | Freq: Two times a day (BID) | 0 refills | Status: AC

## 2023-05-24 MED ORDER — BLOOD GLUCOSE MONITORING SUPPL DEVI: 1.0000 | Freq: Two times a day (BID) | 0 refills | Status: AC

## 2023-05-24 MED ORDER — BLOOD GLUCOSE TEST VI STRP: 1.0000 | ORAL_STRIP | Freq: Two times a day (BID) | 5 refills | Status: AC

## 2023-05-24 MED ORDER — OZEMPIC (0.25 OR 0.5 MG/DOSE) 2 MG/3ML ~~LOC~~ SOPN: PEN_INJECTOR | SUBCUTANEOUS | 1 refills | Status: DC

## 2023-05-24 MED ORDER — LANCETS MISC. MISC: 1.0000 | Freq: Two times a day (BID) | 0 refills | Status: DC

## 2023-05-24 NOTE — Telephone Encounter (Signed)
Called patient and discussed his concerns regarding Ozempic. Advised that GI issues are real especially if he eats portions that are too large. Advised that vision changes were possible if blood sugar levels fluctuate rapidly. Patient does not have a glucometer at home. Will send in meter and supplies. Patient appreciative.

## 2023-05-24 NOTE — Addendum Note (Signed)
Addended by: Cheree Ditto on: 05/24/2023 03:29 PM   Modules accepted: Orders

## 2023-05-28 ENCOUNTER — Encounter: Payer: Self-pay | Admitting: Internal Medicine

## 2023-05-29 ENCOUNTER — Other Ambulatory Visit: Payer: Self-pay | Admitting: Cardiology

## 2023-05-29 LAB — LAB REPORT - SCANNED
A1c: 6.3
EGFR: 77

## 2023-06-02 ENCOUNTER — Encounter: Payer: Self-pay | Admitting: Pharmacist

## 2023-06-03 MED ORDER — LANCETS MISC. MISC: 0 refills | Status: DC

## 2023-06-03 NOTE — Addendum Note (Signed)
Addended by: Cheree Ditto on: 06/03/2023 10:42 AM   Modules accepted: Orders

## 2023-06-24 ENCOUNTER — Encounter: Payer: Self-pay | Admitting: Pharmacist

## 2023-06-27 ENCOUNTER — Telehealth: Payer: Self-pay | Admitting: Cardiology

## 2023-06-27 NOTE — Telephone Encounter (Signed)
Spoke with James Huynh and she is aware of ICD code 42.9

## 2023-06-27 NOTE — Telephone Encounter (Signed)
Medication is being used for diabetes. ICD code 34.9

## 2023-06-27 NOTE — Telephone Encounter (Signed)
Pt c/o medication issue:  1. Name of Medication: Semaglutide,0.25 or 0.5MG /DOS, (OZEMPIC, 0.25 OR 0.5 MG/DOSE,) 2 MG/3ML SOPN   2. How are you currently taking this medication (dosage and times per day)?   As prescribed  3. Are you having a reaction (difficulty breathing--STAT)?   4. What is your medication issue?    Caller Dairl Ponder) wants to get diagnosis code for this medication.

## 2023-07-17 ENCOUNTER — Other Ambulatory Visit: Payer: Self-pay | Admitting: Cardiology

## 2023-07-17 DIAGNOSIS — E1169 Type 2 diabetes mellitus with other specified complication: Secondary | ICD-10-CM

## 2023-07-23 ENCOUNTER — Telehealth: Payer: Self-pay | Admitting: Cardiology

## 2023-07-23 NOTE — Telephone Encounter (Signed)
Patient states no chest pain or any other discomfort at this time.  He states "little pings and weird feelings" in his chest.   He states his arm  goes numb and tingles.  He states day before yesterday had discomfort in neck but not today. No jaw pain at this time.  He states comes and goes mostly at night. He has nitroglycerin but has not taken any. He refuses appt for today due to transportation issues. He is set to see DOD for tomorrow.  He is advised on ED precautions and states understanding.

## 2023-07-23 NOTE — Telephone Encounter (Signed)
  Per MyChart scheduling message:  Little pings in my chest, numbness in my hands to where I can feel the nerves vibrating like crazy and since Sunday night the 6th I have a weird feeling on the left side of my neck just to the left of my Adam's Apple going from my jaw down to my collarbone. And both my heart attacks were neck related I don't have a way to go anywhere today my wife has the car she's off tomorrow

## 2023-07-24 ENCOUNTER — Encounter: Payer: Self-pay | Admitting: Cardiovascular Disease

## 2023-07-24 ENCOUNTER — Ambulatory Visit: Payer: Medicaid Other | Attending: Cardiovascular Disease | Admitting: Cardiovascular Disease

## 2023-07-24 VITALS — BP 107/63 | HR 84 | Ht 70.0 in | Wt 235.6 lb

## 2023-07-24 DIAGNOSIS — R0789 Other chest pain: Secondary | ICD-10-CM

## 2023-07-24 DIAGNOSIS — I214 Non-ST elevation (NSTEMI) myocardial infarction: Secondary | ICD-10-CM | POA: Diagnosis not present

## 2023-07-24 NOTE — Patient Instructions (Signed)
Medication Instructions:  Continue current medications *If you need a refill on your cardiac medications before your next appointment, please call your pharmacy*   Lab Work: None If you have labs (blood work) drawn today and your tests are completely normal, you will receive your results only by: MyChart Message (if you have MyChart) OR A paper copy in the mail If you have any lab test that is abnormal or we need to change your treatment, we will call you to review the results.   Testing/Procedures: None   Follow-Up: At [facility], you and your health needs are our priority.  As part of our continuing mission to provide you with exceptional heart care, we have created designated Provider Care Teams.  These Care Teams include your primary Cardiologist (physician) and Advanced Practice Providers (APPs -  Physician Assistants and Nurse Practitioners) who all work together to provide you with the care you need, when you need it.  We recommend signing up for the patient portal called "MyChart".  Sign up information is provided on this After Visit Summary.  MyChart is used to connect with patients for Virtual Visits (Telemedicine).  Patients are able to view lab/test results, encounter notes, upcoming appointments, etc.  Non-urgent messages can be sent to your provider as well.   To learn more about what you can do with MyChart, go to [email or web address].    Your next appointment:   4 week(s)  Provider:   Edd Fabian, FNP, Juanda Crumble, PA-C, Joni Reining, DNP, ANP, or Bernadene Person, NP    Then, Rollene Rotunda, MD will plan to see you again in 2 month(s).

## 2023-07-24 NOTE — Progress Notes (Signed)
07/24/2023 [name]   13-Nov-1960  [id number]  Primary Physician Lovey Newcomer, PA Primary Cardiologist: Runell Gess MD [name], [name]  HPI:  James Huynh is a 62 y.o. moderately overweight divorced Caucasian male father of 1 daughter, grandfather of 1 grandchild who is currently disabled because of COPD.  He is a cardiology patient of Dr. Antoine Poche who I am seeing as an add-on for atypical neck pain.  His risk factors include discontinued tobacco abuse in 2018, treated hyperlipidemia, and diabetes.  He does have COPD.  He has ischemic heart disease status post LAD and circumflex stenting back in 2016 and RCA stenting in April 2023.  He did have 60% mid AV groove circumflex stenosis at that time treated medically.  He was recently started on a GLP-1 and has lost approximately 15 pounds.  He does complain of dyspnea probably from his COPD but denies chest pain.  He had some atypical symptoms in his left neck which is not painful as well as some occasional "palpitations" in his chest.  His symptoms are not reminiscent of his prior "anginal equivalents".   Current Meds  Medication Sig   Accu-Chek Softclix Lancets lancets 2 (two) times daily.   albuterol (VENTOLIN HFA) 108 (90 Base) MCG/ACT inhaler Inhale 2 puffs into the lungs every 6 (six) hours as needed for wheezing or shortness of breath.   aspirin EC 81 MG tablet Take 81 mg by mouth every evening.   Blood Glucose Monitoring Suppl (ACCU-CHEK GUIDE ME) w/Device KIT SMARTSIG:Via Meter Morning-Night   Budeson-Glycopyrrol-Formoterol (BREZTRI AEROSPHERE) 160-9-4.8 MCG/ACT AERO Inhale 2 puffs into the lungs 2 (two) times daily.   ezetimibe (ZETIA) 10 MG tablet Take 1 tablet (10 mg total) by mouth daily.   famotidine (PEPCID) 20 MG tablet One after supper   furosemide (LASIX) 40 MG tablet TAKE 1 TABLET BY MOUTH DAILY   levothyroxine (SYNTHROID) 150 MCG tablet Take 150 mcg by mouth daily.   metoprolol succinate  (TOPROL-XL) 25 MG 24 hr tablet Take 0.5 tablets (12.5 mg total) by mouth at bedtime. Please make sure to come to your next appt.   omeprazole (PRILOSEC) 40 MG capsule Take 1 capsule (40 mg total) by mouth daily.   potassium chloride (KLOR-CON) 10 MEQ tablet Take 1 tablet (10 mEq total) by mouth daily.   prednisoLONE acetate (PRED FORTE) 1 % ophthalmic suspension Place 1 drop into the left eye 3 (three) times daily.   rosuvastatin (CRESTOR) 40 MG tablet Take 1 tablet (40 mg total) by mouth at bedtime.   Semaglutide,0.25 or 0.5MG /DOS, (OZEMPIC, 0.25 OR 0.5 MG/DOSE,) 2 MG/3ML SOPN Inject 0.5mg  once a week     Allergies  Allergen Reactions   Penicillins Anaphylaxis    Social History   Socioeconomic History   Marital status: Divorced    Spouse name: Not on file   Number of children: Not on file   Years of education: Not on file   Highest education level: Not on file  Occupational History   Not on file  Tobacco Use   Smoking status: Former    Current packs/day: 0.00    Average packs/day: 1 pack/day for 45.5 years (45.5 ttl pk-yrs)    Types: Cigarettes    Start date: 08/06/1975    Quit date: 01/30/2021    Years since quitting: 2.4    Passive exposure: Current   Smokeless tobacco: Never  Vaping Use   Vaping status: Never Used  Substance and Sexual Activity  Alcohol use: No    Alcohol/week: 0.0 standard drinks of alcohol   Drug use: Yes    Frequency: 1.0 times per week    Comment: occasionally smokes marijuana.-Denies 01/15/22   Sexual activity: Yes  Other Topics Concern   Not on file  Social History Narrative   Lives in [place] with his girlfriend and 32 year old father.  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   Social Determinants of Health   Financial Resource Strain: Not on file  Food Insecurity: Not on file  Transportation Needs: Not on file  Physical Activity: Not on file  Stress: Not on file  Social Connections: Not on file   Intimate Partner Violence: Not on file     Review of Systems: General: negative for chills, fever, night sweats or weight changes.  Cardiovascular: negative for chest pain, dyspnea on exertion, edema, orthopnea, palpitations, paroxysmal nocturnal dyspnea or shortness of breath Dermatological: negative for rash Respiratory: negative for cough or wheezing Urologic: negative for hematuria Abdominal: negative for nausea, vomiting, diarrhea, bright red blood per rectum, melena, or hematemesis Neurologic: negative for visual changes, syncope, or dizziness All other systems reviewed and are otherwise negative except as noted above.    Blood pressure 107/63, pulse 84, height 5\' 10"  (1.778 m), weight 235 lb 9.6 oz (106.9 kg).  General appearance: alert and no distress Neck: no adenopathy, no carotid bruit, no JVD, supple, symmetrical, trachea midline, and thyroid not enlarged, symmetric, no tenderness/mass/nodules Lungs: clear to auscultation bilaterally Heart: regular rate and rhythm, S1, S2 normal, no murmur, click, rub or gallop Extremities: extremities normal, atraumatic, no cyanosis or edema Pulses: 2+ and symmetric Skin: Skin color, texture, turgor normal. No rashes or lesions Neurologic: Grossly normal  EKG EKG Interpretation Date/Time:  Wednesday July 24 2023 16:15:06 EDT Ventricular Rate:  84 PR Interval:  136 QRS Duration:  96 QT Interval:  378 QTC Calculation: 446 R Axis:   23  Text Interpretation: Normal sinus rhythm ST & T wave abnormality, consider anterior ischemia When compared with ECG of 07-Sep-2022 16:11, T wave inversion now evident in Anterior leads Confirmed by Nanetta Batty [phone]) on 07/24/2023 4:25:33 PM    ASSESSMENT AND PLAN:   NSTEMI (non-ST elevated myocardial infarction) (HCC) History of CAD status post cath in 2016 notable for circumflex and LAD stenting.  He underwent cardiac catheterization 01/15/2022 by Dr. Kirke Corin.  He had a patent LAD stent, 60% AV  groove circumflex and 99% proximal to mid dominant RCA stenosis that underwent stenting.  Over the last several weeks he has had some unusual sensation in his left neck but this not sound like pain as well as some occasional "flutters" in his chest.  He denies chest pain.  He is limited by dyspnea secondary to COPD.  His EKG shows anterior T wave inversion but this is minimally changed from prior EKGs.  This point I do not think his symptoms represent myocardial ischemia.  I am going to have him see an APP back in 4 weeks and arrange for him to see Dr. Antoine Poche back in December.     Runell Gess MD FACP,FACC,FAHA, [facility] 07/24/2023 4:43 PM

## 2023-07-24 NOTE — Assessment & Plan Note (Signed)
History of CAD status post cath in 2016 notable for circumflex and LAD stenting.  He underwent cardiac catheterization 01/15/2022 by Dr. Kirke Corin.  He had a patent LAD stent, 60% AV groove circumflex and 99% proximal to mid dominant RCA stenosis that underwent stenting.  Over the last several weeks he has had some unusual sensation in his left neck but this not sound like pain as well as some occasional "flutters" in his chest.  He denies chest pain.  He is limited by dyspnea secondary to COPD.  His EKG shows anterior T wave inversion but this is minimally changed from prior EKGs.  This point I do not think his symptoms represent myocardial ischemia.  I am going to have him see an APP back in 4 weeks and arrange for him to see Dr. Antoine Poche back in December.

## 2023-07-30 ENCOUNTER — Encounter: Payer: Self-pay | Admitting: Pharmacist

## 2023-07-30 DIAGNOSIS — E1169 Type 2 diabetes mellitus with other specified complication: Secondary | ICD-10-CM

## 2023-07-31 MED ORDER — OZEMPIC (1 MG/DOSE) 4 MG/3ML ~~LOC~~ SOPN: 1.0000 mg | PEN_INJECTOR | SUBCUTANEOUS | 0 refills | Status: DC

## 2023-07-31 MED ORDER — LANCETS MISC. MISC: 0 refills | Status: AC

## 2023-07-31 NOTE — Addendum Note (Signed)
Addended by: Cheree Ditto on: 07/31/2023 02:04 PM   Modules accepted: Orders

## 2023-08-08 ENCOUNTER — Encounter: Payer: Self-pay | Admitting: Internal Medicine

## 2023-08-16 ENCOUNTER — Other Ambulatory Visit: Payer: Self-pay | Admitting: Cardiology

## 2023-08-18 ENCOUNTER — Other Ambulatory Visit: Payer: Self-pay | Admitting: Cardiology

## 2023-08-18 ENCOUNTER — Other Ambulatory Visit: Payer: Self-pay | Admitting: Internal Medicine

## 2023-08-18 DIAGNOSIS — E1169 Type 2 diabetes mellitus with other specified complication: Secondary | ICD-10-CM

## 2023-08-19 NOTE — Telephone Encounter (Signed)
Mychart message sent to see if pt want to increase to 1mg 

## 2023-08-20 ENCOUNTER — Ambulatory Visit: Payer: Medicaid Other | Admitting: Adult Health

## 2023-08-26 LAB — LAB REPORT - SCANNED
A1c: 5.9
EGFR: 61

## 2023-08-27 ENCOUNTER — Encounter: Payer: Self-pay | Admitting: Cardiology

## 2023-08-27 DIAGNOSIS — E876 Hypokalemia: Secondary | ICD-10-CM

## 2023-08-30 NOTE — Telephone Encounter (Signed)
Patient identification verified by 2 forms. Marilynn Rail, RN    Called and spoke to patient  Patient states:   -has a prescription for Potassium , two tablets daily   -prescription was given on 04/24/23, by Dr. Antoine Poche  Advised patient to take medication as directed and present to lab in 1 week as planned  Patient agrees, no questions at this time

## 2023-09-02 ENCOUNTER — Other Ambulatory Visit: Payer: Self-pay

## 2023-09-02 ENCOUNTER — Encounter: Payer: Self-pay | Admitting: Internal Medicine

## 2023-09-02 MED ORDER — BREZTRI AEROSPHERE 160-9-4.8 MCG/ACT IN AERO: 2.0000 | INHALATION_SPRAY | Freq: Two times a day (BID) | RESPIRATORY_TRACT | 5 refills | Status: DC

## 2023-09-15 ENCOUNTER — Encounter: Payer: Self-pay | Admitting: Cardiology

## 2023-09-16 NOTE — Progress Notes (Unsigned)
Cardiology Office Note:   Date:  09/19/2023  ID:  James Huynh, DOB 02-20-1961, MRN [id number] PCP: Lovey Newcomer, [place]  [facility] Providers Cardiologist:  Rollene Rotunda, MD {  History of Present Illness:   James Huynh is a 62 y.o. male for follow up of CAD.  Cardiac cath 2016 20% proximal RCA, 80% proximal to mid circumflex with DES, 80% mid LAD with DES.  He was hospitalized from 01/15/2022-01/18/2022 in the setting of NSTEMI. Echocardiogram showed EF 60 to 65%, no RWMA, no significant valvular abnormalities. Cardiac catheterization on 01/17/2022 showed mLCX 60% (ISR), pLAD 30% (ISR), pRCA 99-0% s/p aspiration thrombectomy, DES, and p-mRCA 20%.   He called the echo today to the schedule because he was having some left lower leg discomfort.  He had some swelling and some pain.  He had some trauma to that a couple years of years ago and has been worried about developing blood clots.  He said the pain woke him up a few days ago at 4 AM.  It was a sharp pain in his leg.  He did get up and walk around and took Tylenol but did not notice any swelling or redness.  He did not really pay as much attention to it as he thought he should have.  He is otherwise had some pins-and-needles in his left hand.  He has had some fleeting chest discomfort.  He has other somatic complaints.  Apparently being treated with some antibiotics and steroids now for possible bronchitis.  ROS: As stated in the HPI and negative for all other systems.  Studies Reviewed:    EKG:     Normal sinus rhythm, rate 84, axis within normal limits, intervals within normal limits, RSR prime V1 and V2, nonspecific anterior T wave changes unchanged from EKG of November 2023.    Risk Assessment/Calculations:              Physical Exam:   VS:  BP 102/68 (BP Location: Left Arm, Patient Position: Sitting, Cuff Size: Large)   Pulse 89   Ht 5\' 10"  (1.778 m)   Wt 234 lb 6.4 oz (106.3 kg)   SpO2 96%   BMI 33.63 kg/m     Wt Readings from Last 3 Encounters:  09/19/23 234 lb 6.4 oz (106.3 kg)  07/24/23 235 lb 9.6 oz (106.9 kg)  05/21/23 251 lb 6.4 oz (114 kg)     GEN: Well nourished, well developed in no acute distress NECK: No JVD; No carotid bruits CARDIAC: RRR, no murmurs, rubs, gallops RESPIRATORY:  Clear to auscultation without rales, wheezing or rhonchi  ABDOMEN: Soft, non-tender, non-distended EXTREMITIES:  No edema; No deformity, possible mild left calf swelling  ASSESSMENT AND PLAN:   CAD in native artery :   He is not having any of the chest pain that was consistent with his previous angina.  He has some nonanginal sounding pins-and-needles in his left arm and fleeting chest discomfort occasionally.  We talked through this.  He will continue otherwise with the meds as listed.   Hyperlipidemia : LDL was 33 with an HDL of 32.   Chronic obstructive pulmonary disease, unspecified COPD type [facility]):   He is followed by his primary provider.  Morbid obesity (HCC):   He took himself off Ozempic recently because he had some abdominal complaints and he just started hearing on the bad press about it.  He does not want take this anymore.  He will try diet alone.  Leg pain: I have a low suspicion for DVT but he quite worried about this so I will order a lower extremity venous Doppler     Follow up with me in one year in [place].    Signed, Rollene Rotunda, MD

## 2023-09-19 ENCOUNTER — Encounter: Payer: Self-pay | Admitting: Cardiology

## 2023-09-19 ENCOUNTER — Ambulatory Visit: Payer: Medicaid Other | Attending: Cardiology | Admitting: Cardiology

## 2023-09-19 VITALS — BP 102/68 | HR 89 | Ht 70.0 in | Wt 234.4 lb

## 2023-09-19 DIAGNOSIS — Z86718 Personal history of other venous thrombosis and embolism: Secondary | ICD-10-CM

## 2023-09-19 DIAGNOSIS — I2510 Atherosclerotic heart disease of native coronary artery without angina pectoris: Secondary | ICD-10-CM | POA: Diagnosis not present

## 2023-09-19 DIAGNOSIS — E785 Hyperlipidemia, unspecified: Secondary | ICD-10-CM

## 2023-09-19 DIAGNOSIS — J449 Chronic obstructive pulmonary disease, unspecified: Secondary | ICD-10-CM

## 2023-09-19 NOTE — Patient Instructions (Signed)
Medication Instructions:  No changes.  *If you need a refill on your cardiac medications before your next appointment, please call your pharmacy.   Testing/Procedures: Your physician has requested that you have a lower extremity venous duplex. This test is an ultrasound of the veins in the legs or arms. It looks at venous blood flow that carries blood from the heart to the legs or arms. Allow one hour for a Lower Venous exam. Allow thirty minutes for an Upper Venous exam. There are no restrictions or special instructions.  Please note: We ask at that you not bring children with you during ultrasound (echo/ vascular) testing. Due to room size and safety concerns, children are not allowed in the ultrasound rooms during exams. Our front office staff cannot provide observation of children in our lobby area while testing is being conducted. An adult accompanying a patient to their appointment will only be allowed in the ultrasound room at the discretion of the ultrasound technician under special circumstances. We apologize for any inconvenience.    Follow-Up: At [facility], you and your health needs are our priority.  As part of our continuing mission to provide you with exceptional heart care, we have created designated Provider Care Teams.  These Care Teams include your primary Cardiologist (physician) and Advanced Practice Providers (APPs -  Physician Assistants and Nurse Practitioners) who all work together to provide you with the care you need, when you need it.  We recommend signing up for the patient portal called "MyChart".  Sign up information is provided on this After Visit Summary.  MyChart is used to connect with patients for Virtual Visits (Telemedicine).  Patients are able to view lab/test results, encounter notes, upcoming appointments, etc.  Non-urgent messages can be sent to your provider as well.   To learn more about what you can do with MyChart, go to [email or web address].     Your next appointment:   12 month(s)  Provider:   Rollene Rotunda MD in [place] office please.

## 2023-10-03 ENCOUNTER — Ambulatory Visit (HOSPITAL_COMMUNITY)
Admission: RE | Admit: 2023-10-03 | Discharge: 2023-10-03 | Disposition: A | Discharge status: Home / Self Care | Payer: Medicaid Other | Source: Ambulatory Visit | Attending: Internal Medicine | Admitting: Internal Medicine

## 2023-10-03 DIAGNOSIS — Z86718 Personal history of other venous thrombosis and embolism: Secondary | ICD-10-CM | POA: Diagnosis present

## 2023-10-13 NOTE — Progress Notes (Unsigned)
James Huynh, male    DOB: Mar 11, 1961   MRN: [id number]   Brief patient profile:  35  yowm  MM/Quit smoking (and inhaling) cigars  01/2021 p already stopped cigs in 1992 due to  dx of  asthma rx albuterol and more albuterol up to 4 inhalers  per month and much better after a week of prednisone referred to pulmonary clinic in [facility]  02/13/2021 by [name], PA   Born at [facility] with "need for trach before age [age]" but "parents refused" then subsequenly  no problem age 62 then episodic  facial swelling  and difficulty breathing eval by "[facility]"  allergist in [place] > allergic to everything"  took shots until age 60 still problems in spring mostly rhinitis not asthma until 1992.       History of Present Illness  02/13/2021  Pulmonary/ 1st office eval/ Nahima Ales / [place] [facility]  Chief Complaint  Patient presents with   Pulmonary Consult    Referred by [name], PA. Pt c/o SOB for the past 2 years worse over the past 2 wks. Pt states that he always feels like he is unable to take a deep enough breath, feels better when he yawns. He is using his albuterol several times per day.   Dyspnea: limited by R radicular back pain > sob  Cough: sporadic /no rhinitis at initial eval or noct cough  Sleep: no problem noct lying flat SABA use: way too much  rec Plan A = Automatic = Always=    Symbicort 160 Take 2 puffs first thing in am and then another 2 puffs about 12 hours later.  Work on inhaler technique:    Plan B = Backup (to supplement plan A, not to replace it) Only use your albuterol inhaler as a rescue medication Plan C = Crisis (instead of Plan B but only if Plan B stops working) - only use your albuterol nebulizer if you first try Plan B  Omeprazole should be 40 mg  Take 30-60 min before first meal of the day  GERD diet/ lifestyle rx    08/14/2021  Labs: Allergy profile  Eos 0.1/ IgE 103   alpha one AT phenotype  MM level 141    02/22/2023  f/u ov/[place]  office/[place] re: GOLD 2 COPD /AB maint on Breztri last pred end of Jan 2024 for cough esp hs   Chief Complaint  Patient presents with   Follow-up  Dyspnea:   back stops him before his breathing Cough: clear mucus esp in am and hs  Sleeping: bed is flat/ one pillow  SABA use: saba 3-4 x per day much less on  prednisone  02: none  Rec Plan A = Automatic = Always=    Breztri  Work on perfect technique with your inhalers like we worked on today   Plan B = Backup (to supplement plan A, not to replace it) Only use your albuterol inhaler as a rescue medication  Plan C = Crisis (instead of Plan B but only if Plan B stops working) - only use your albuterol nebulizer if you first try Plan B  Add pepcid 20 mg after supper  GERD diet reviewed, bed blocks rec   For cough mucinex  DM 1200 mg every 12 hours  referral to sinus CT  >nl  03/08/23     04/08/2023  f/u ov/[place] office/Depaul Arizpe re: GOLD 2 copd/AB vs ACOS  maint on breztri  did not  bring all meds / Chief  Complaint  Patient presents with   Follow-up    Cough is some better -still occ cough with clear sputum. Breathing has also improved. He is using his albuterol inhaler 5-6 x per day. Has not needed next since last seen here.   Dyspnea:  back stops before breathing most of the time  Cough: minimal > clear mostly in ams Sleeping: bed is flat/ one pillow no res cc  SABA use: hfa up to 6 x day ( down to 4 cannisters per month)  02: none  Rec No change in medications  Also  Ok to try albuterol x 2 pffs x  15 min before an activity (on alternating days)  that you know would usually make you short of breath      10/14/2023  f/u ov/[place] office/Aleric Froelich re: GOLD 2 copd/ AB  maint on breztri  worse x 6 weeks  Doxy / prednisone  helped 50%   Chief Complaint  Patient presents with   Follow-up    6 month follow up   Dyspnea:  back stops before breathing Cough: non-productive esp at hs  Sleeping: horizontal with one pillow  02: none   SABA :  maybe2-3 days per day   Lung cancer screening: due in 11/2023    No obvious day to day or daytime variability or assoc excess/ purulent sputum or mucus plugs or hemoptysis or cp or chest tightness, subjective wheeze or overt sinus or hb symptoms.    Also denies any obvious fluctuation of symptoms with weather or environmental changes or other aggravating or alleviating factors except as outlined above   No unusual exposure hx or h/o childhood pna/ asthma or knowledge of premature birth.  Current Allergies, Complete Past Medical History, Past Surgical History, Family History, and Social History were reviewed in [employer] record.  ROS  The following are not active complaints unless bolded Hoarseness, sore throat, dysphagia, dental problems, itching, sneezing,  nasal congestion or discharge of excess mucus or purulent secretions, ear ache,   fever, chills, sweats, unintended wt loss or wt gain, classically pleuritic or exertional cp,  orthopnea pnd or arm/hand swelling  or leg swelling, presyncope, palpitations, abdominal pain, anorexia, nausea, vomiting, diarrhea  or change in bowel habits or change in bladder habits, change in stools or change in urine, dysuria, hematuria,  rash, arthralgias, visual complaints, headache, numbness, weakness or ataxia or problems with walking or coordination,  change in mood or  memory.        Current Meds  Medication Sig   Accu-Chek Softclix Lancets lancets 2 (two) times daily.   albuterol (PROVENTIL) (2.5 MG/3ML) 0.083% nebulizer solution Take 2.5 mg by nebulization every 6 (six) hours as needed for wheezing or shortness of breath.   albuterol (VENTOLIN HFA) 108 (90 Base) MCG/ACT inhaler Inhale 2 puffs into the lungs every 6 (six) hours as needed for wheezing or shortness of breath.   aspirin EC 81 MG tablet Take 81 mg by mouth every evening.   Blood Glucose Monitoring Suppl (ACCU-CHEK GUIDE ME) w/Device KIT SMARTSIG:Via Meter  Morning-Night   Blood Glucose Monitoring Suppl DEVI 1 each by Does not apply route in the morning and at bedtime. May substitute to any manufacturer covered by patient's insurance.   Budeson-Glycopyrrol-Formoterol (BREZTRI AEROSPHERE) 160-9-4.8 MCG/ACT AERO Inhale 2 puffs into the lungs 2 (two) times daily.   ezetimibe (ZETIA) 10 MG tablet Take 1 tablet (10 mg total) by mouth daily.   famotidine (PEPCID) 20 MG tablet One after  supper   furosemide (LASIX) 40 MG tablet TAKE 1 TABLET BY MOUTH DAILY   Glucose Blood (BLOOD GLUCOSE TEST STRIPS) STRP 1 each by In Vitro route in the morning and at bedtime. May substitute to any manufacturer covered by patient's insurance.   Lancet Device MISC 1 each by Does not apply route 2 (two) times daily. May substitute to any manufacturer covered by patient's insurance.   Lancets Misc. MISC Use to test blood sugar twice daily. E11.9. #100. May substitute to any manufacturer covered by patient's insurance.   levothyroxine (SYNTHROID) 150 MCG tablet Take 150 mcg by mouth daily.   metoprolol succinate (TOPROL-XL) 25 MG 24 hr tablet TAKE 1/2 TABLET BY MOUTH AT BEDTIME (PLEASE MAKE SURE TO COME TO YOUR NEXT APPOINTMENT)   nitroGLYCERIN (NITROSTAT) 0.4 MG SL tablet PLACE 1 TABLET UNDER THE TONGUE EVERY 5 MIN UP TO 3 TIMES AS NEEDED FOR CHEST PAIN   omeprazole (PRILOSEC) 40 MG capsule TAKE ONE CAPSULE BY MOUTH DAILY   rosuvastatin (CRESTOR) 40 MG tablet Take 1 tablet (40 mg total) by mouth at bedtime.               Past Medical History:  Diagnosis Date   Asthma    CAD (coronary artery disease)    cath 09/09/2015 20% prox RCA, 80% prox to mid LCx treated with DES (3.518 mm Xience Alpine DES), 80% mid LAD lesion treated with 3.518 mm Xience Alpine DES stent postdilated to 4.2.   Chest pain    a. negative stress echo August 2011   GERD (gastroesophageal reflux disease)    High cholesterol    History of pneumonia    a. 2014.   Hypothyroidism    Morbid obesity  (HCC)    Tobacco abuse       Objective:    Wts  10/14/2023     231  04/08/2023       250  02/22/2023       245 08/24/2022     251  07/19/2022       250  02/19/2022         248  08/14/2021     230   03/17/21 234 lb 9.6 oz (106.4 kg)  02/13/21 234 lb (106.1 kg)  01/17/21 236 lb 3.2 oz (107.1 kg)    Vital signs reviewed  10/14/2023  - Note at rest 02 sats  94% on RA   General appearance:    amb wm nad    LUNGS: no acc muscle use,  Min barrel trace edema L>R***          I personally reviewed images and agree with radiology impression as follows:   Chest LDSCT     12/04/22 1. Lung-RADS 2, benign appearance or behavior. Continue annual screening with low-dose chest CT without contrast in 12 months. 2. There is aortic atherosclerosis, in addition to left anterior descending, left circumflex and right coronary artery stents. 3. Mild diffuse bronchial wall thickening with mild centrilobular and paraseptal emphysema; imaging findings suggestive of underlying COPD.      CXR PA and Lateral:   10/14/2023 :    I personally reviewed images and impression is as follows:     No acute dz     Assessment

## 2023-10-14 ENCOUNTER — Ambulatory Visit: Payer: Medicaid Other | Admitting: Internal Medicine

## 2023-10-14 ENCOUNTER — Ambulatory Visit (HOSPITAL_COMMUNITY)
Admission: RE | Admit: 2023-10-14 | Discharge: 2023-10-14 | Disposition: A | Discharge status: Home / Self Care | Payer: Medicaid Other | Source: Ambulatory Visit | Attending: Internal Medicine | Admitting: Internal Medicine

## 2023-10-14 ENCOUNTER — Encounter: Payer: Self-pay | Admitting: Internal Medicine

## 2023-10-14 VITALS — BP 105/69 | HR 83 | Ht 70.0 in | Wt 231.0 lb

## 2023-10-14 DIAGNOSIS — J449 Chronic obstructive pulmonary disease, unspecified: Secondary | ICD-10-CM | POA: Diagnosis present

## 2023-10-14 DIAGNOSIS — R058 Other specified cough: Secondary | ICD-10-CM | POA: Diagnosis not present

## 2023-10-14 MED ORDER — AZITHROMYCIN 250 MG PO TABS: ORAL_TABLET | ORAL | 0 refills | Status: DC

## 2023-10-14 MED ORDER — GABAPENTIN 300 MG PO CAPS: 300.0000 mg | ORAL_CAPSULE | Freq: Three times a day (TID) | ORAL | 2 refills | Status: DC

## 2023-10-14 MED ORDER — BUDESONIDE-FORMOTEROL FUMARATE 80-4.5 MCG/ACT IN AERO: INHALATION_SPRAY | RESPIRATORY_TRACT | 12 refills | Status: DC

## 2023-10-14 MED ORDER — PREDNISONE 10 MG PO TABS: ORAL_TABLET | ORAL | 0 refills | Status: DC

## 2023-10-14 NOTE — Patient Instructions (Addendum)
Start gabapentin 300 mg at bedtime  x one week and can add one dose a week up to 4 x daily is maximum    For cough/ congestion >  mucinex dm (over the counter)   up to maximum of  1200 mg every 12 hours and use the flutter valve as much as you can    Zpak  Prednisone 10 mg take  4 each am x 2 days,   2 each am x 2 days,  1 each am x 2 days and stop    Please remember to go to the  x-ray department  @  [facility] for your tests - we will call you with the results when they are available     GERD (REFLUX)  is an extremely common cause of respiratory symptoms just like yours , many times with no obvious heartburn at all.    It can be treated with medication, but also with lifestyle changes including elevation of the head of your bed (ideally with 6 -8inch blocks under the headboard of your bed),  Smoking cessation, avoidance of late meals, excessive alcohol, and avoid fatty foods, chocolate, peppermint, colas, red wine, and acidic juices such as orange juice.  NO MINT OR MENTHOL PRODUCTS SO NO COUGH DROPS  USE SUGARLESS CANDY INSTEAD (Jolley ranchers or Stover's or Life Savers) or even ice chips will also do - the key is to swallow to prevent all throat clearing. NO OIL BASED VITAMINS - use powdered substitutes.  Avoid fish oil when coughing.   Please schedule a follow up office visit in 6 weeks, call sooner if needed

## 2023-10-16 NOTE — Assessment & Plan Note (Addendum)
 Onset of rhinitis/sinusitis as child/ no better on allergy  shots - totally free of symptoms 2007 - 2020 then exp to indoor cat  - 08/14/2021 Allergy  profile  Eos 0.1/ IgE 103  -  added pecpid 20 mg q pm 02/22/2023  - Sinus CT 03/08/2023 >>> nl  - Gabapentin  trial 10/14/2023 >>>     Of the three most common causes of  Sub-acute / recurrent or chronic cough, only one (GERD)  can actually contribute to/ trigger  the other two (asthma and post nasal drip syndrome)  and perpetuate the cylce of cough.  While not intuitively obvious, many patients with chronic low grade reflux do not cough until there is a primary insult that disturbs the protective epithelial barrier and exposes sensitive nerve endings.   This is typically viral but can due to PNDS and  either may apply here.   The point is that once this occurs, it is difficult to eliminate the cycle  using anything but a maximally effective acid suppression regimen at least in the short run, accompanied by an appropriate diet to address non acid GERD and control / eliminate the cough itself with gabapentin  titrated to max dose of 300 mg qid see avs for instructions unique to this ov          Each maintenance medication was reviewed in detail including emphasizing most importantly the difference between maintenance and prns and under what circumstances the prns are to be triggered using an action plan format where appropriate.  Total time for H and P, chart review, counseling, reviewing hfa/neb  device(s) and generating customized AVS unique to this office visit / same day charting = 34 min for refractory resp symptoms

## 2023-10-16 NOTE — Assessment & Plan Note (Addendum)
 Quit smoking 01/2021/MM -  H/o childhood allergies to everything on allergy  shots age 63 -9  - PFT's 05/08/18  FEV1 2.44 (65 % ) ratio 0.58  p 7 % improvement from saba p ? saba hfa and neb prior to study with DLCO  21.84 (67%) corrects to 3.55 (76%)  for alv volume and FV curve classic mild/mod concavity plus  ERV 35% at wt 231  - 02/13/2021  After extensive coaching inhaler device,  effectiveness =    75% (short ti) > try symbicort  160 2bid and if fails > change to breztri   - 03/17/2021  After extensive coaching inhaler device,  effectiveness =  75%> try   Breztri   2 bid and if not happy with benefits vs cost then back to stiolto 2 pffs each am  -  03/17/2021   Walked RA  approx   300 ft  @ moderate pace  stopped due end of study with sats 98%    - Labs ordered 08/14/2021  :  allergy  profile  Eos 0.1/ IgE 103   alpha one AT phenotype  MM level 141  - 08/14/2021   Walked on RA x  3  lap(s) =  approx 472ft @ mod to fast pace, stopped due to end of study, mild sob  with lowest 02 sats 95% at lowest   - 02/19/2022  After extensive coaching inhaler device,  effectiveness =    90% > continue breztri  and more approp saba - 07/19/2022   Walked on RA  x  3  lap(s) =  approx 450  ft  @ mod pace, stopped due to end of study  with lowest 02 sats 95% s sob  LDSCT     12/04/22 mild centrilobularand paraseptal emphysema; imaging findings suggestive of underlying COPD. - 10/14/2023  After extensive coaching inhaler device,  effectiveness =    90% so try symbicort  80 2 bid and max rx for gerd/ add gabapentin  for chronic cough   DDX of  difficult airways management almost all start with A and  include Adherence, Ace Inhibitors, Acid Reflux, Active Sinus Disease, Alpha 1 Antitripsin deficiency, Anxiety masquerading as Airways dz,  ABPA,  Allergy (esp in young), Aspiration (esp in elderly), Adverse effects of meds,  Active smoking or vaping, A bunch of PE's (a small clot burden can't cause this syndrome unless there is already  severe underlying pulm or vascular dz with poor reserve) plus two Bs  = Bronchiectasis and Beta blocker use..and one C= CHF  Adherence is always the initial prime suspect and is a multilayered concern that requires a trust but verify approach in every patient - starting with knowing how to use medications, especially inhalers, correctly, keeping up with refills and understanding the fundamental difference between maintenance and prns vs those medications only taken for a very short course and then stopped and not refilled.  -see hfa teaching  - return with all meds in hand using a trust but verify approach to confirm accurate Medication  Reconciliation The principal here is that until we are certain that the  patients are doing what we've asked, it makes no sense to ask them to do more.   ? Acid (or non-acid) GERD > always difficult to exclude as up to 75% of pts in some series report no assoc GI/ Heartburn symptoms> rec max (24h)  acid suppression and diet restrictions/ reviewed and instructions given in writing.   ? Allergy  /asthma component > Prednisone  10 mg take  4 each  am x 2 days,   2 each am x 2 days,  1 each am x 2 days and stop and change breztri  to  trial of symbicort  80 2bid trial basis as the breztri  may be irritating his upper airway more than helping his lower airway component (note low baseline EOS)   ? Alpha one at def > already excluded   ? BB effects > not usually seen on this low a dose of toprol    ? Chf > nothing to suggest on cxr

## 2023-10-30 ENCOUNTER — Other Ambulatory Visit: Payer: Self-pay | Admitting: Cardiology

## 2023-10-30 DIAGNOSIS — E1169 Type 2 diabetes mellitus with other specified complication: Secondary | ICD-10-CM

## 2023-11-03 ENCOUNTER — Encounter: Payer: Self-pay | Admitting: Cardiology

## 2023-11-03 ENCOUNTER — Other Ambulatory Visit: Payer: Self-pay | Admitting: Cardiology

## 2023-11-03 DIAGNOSIS — E1169 Type 2 diabetes mellitus with other specified complication: Secondary | ICD-10-CM

## 2023-11-11 ENCOUNTER — Encounter: Payer: Self-pay | Admitting: Internal Medicine

## 2023-11-13 ENCOUNTER — Other Ambulatory Visit: Payer: Self-pay | Admitting: Physician Assistant

## 2023-11-13 DIAGNOSIS — R202 Paresthesia of skin: Secondary | ICD-10-CM

## 2023-11-13 DIAGNOSIS — G459 Transient cerebral ischemic attack, unspecified: Secondary | ICD-10-CM

## 2023-11-13 DIAGNOSIS — R42 Dizziness and giddiness: Secondary | ICD-10-CM

## 2023-11-14 ENCOUNTER — Ambulatory Visit
Admission: RE | Admit: 2023-11-14 | Discharge: 2023-11-14 | Disposition: A | Discharge status: Home / Self Care | Payer: Medicaid Other | Source: Ambulatory Visit | Attending: Physician Assistant | Admitting: Physician Assistant

## 2023-11-14 DIAGNOSIS — G459 Transient cerebral ischemic attack, unspecified: Secondary | ICD-10-CM

## 2023-11-14 DIAGNOSIS — R202 Paresthesia of skin: Secondary | ICD-10-CM

## 2023-11-14 DIAGNOSIS — R42 Dizziness and giddiness: Secondary | ICD-10-CM

## 2023-11-14 NOTE — Progress Notes (Signed)
Pt tried MR on 11/14/23 at [facility]. Pt could not breathe when laying flat due to his emphysema. Pt will reach out to ordering provider to determine plan of care

## 2023-11-17 ENCOUNTER — Other Ambulatory Visit: Payer: Self-pay | Admitting: Internal Medicine

## 2023-11-24 NOTE — Progress Notes (Deleted)
 James Huynh, male    DOB: Apr 06, 1961   MRN: [id number]   Brief patient profile:  63  yowm  MM/Quit smoking (and inhaling) cigars  01/2021 p already stopped cigs in 1992 due to  dx of  asthma rx albuterol and more albuterol up to 4 inhalers  per month and much better after a week of prednisone referred to pulmonary clinic in [facility]  02/13/2021 by [name], PA   Born at [facility] with "need for trach before age [age]" but "parents refused" then subsequenly  no problem age 40 then episodic  facial swelling  and difficulty breathing eval by "[facility]"  allergist in [place] > allergic to everything"  took shots until age 63 still problems in spring mostly rhinitis not asthma until 1992.       History of Present Illness  02/13/2021  Pulmonary/ 1st office eval/ Franchon Ketterman / [place] [facility]  Chief Complaint  Patient presents with   Pulmonary Consult    Referred by [name], PA. Pt c/o SOB for the past 2 years worse over the past 2 wks. Pt states that he always feels like he is unable to take a deep enough breath, feels better when he yawns. He is using his albuterol several times per day.   Dyspnea: limited by R radicular back pain > sob  Cough: sporadic /no rhinitis at initial eval or noct cough  Sleep: no problem noct lying flat SABA use: way too much  rec Plan A = Automatic = Always=    Symbicort 160 Take 2 puffs first thing in am and then another 2 puffs about 12 hours later.  Work on inhaler technique:    Plan B = Backup (to supplement plan A, not to replace it) Only use your albuterol inhaler as a rescue medication Plan C = Crisis (instead of Plan B but only if Plan B stops working) - only use your albuterol nebulizer if you first try Plan B  Omeprazole should be 40 mg  Take 30-60 min before first meal of the day  GERD diet/ lifestyle rx    08/14/2021  Labs: Allergy profile  Eos 0.1/ IgE 103   alpha one AT phenotype  MM level 141    02/22/2023  f/u ov/[place]  office/Addison Whidbee re: GOLD 2 COPD /AB maint on Breztri last pred end of Jan 2024 for cough esp hs   Chief Complaint  Patient presents with   Follow-up  Dyspnea:   back stops him before his breathing Cough: clear mucus esp in am and hs  Sleeping: bed is flat/ one pillow  SABA use: saba 3-4 x per day much less on  prednisone  02: none  Rec Plan A = Automatic = Always=    Breztri  Work on perfect technique with your inhalers like we worked on today   Plan B = Backup (to supplement plan A, not to replace it) Only use your albuterol inhaler as a rescue medication  Plan C = Crisis (instead of Plan B but only if Plan B stops working) - only use your albuterol nebulizer if you first try Plan B  Add pepcid 20 mg after supper  GERD diet reviewed, bed blocks rec   For cough mucinex  DM 1200 mg every 12 hours  referral to sinus CT  >nl  03/08/23     04/08/2023  f/u ov/[place] office/Laverne Klugh re: GOLD 2 copd/AB vs ACOS  maint on breztri  did not  bring all meds / Chief  Complaint  Patient presents with   Follow-up    Cough is some better -still occ cough with clear sputum. Breathing has also improved. He is using his albuterol inhaler 5-6 x per day. Has not needed next since last seen here.   Dyspnea:  back stops before breathing most of the time  Cough: minimal > clear mostly in ams Sleeping: bed is flat/ one pillow no res cc  SABA use: hfa up to 6 x day ( down to 4 cannisters per month)  02: none  Rec No change in medications  Also  Ok to try albuterol x 2 pffs x  15 min before an activity (on alternating days)  that you know would usually make you short of breath      10/14/2023  f/u ov/[place] office/Cordarious Zeek re: GOLD 2 copd/ AB  maint on breztri  worse x 6 weeks  Already rx Doxy / prednisone  helped 50%   Chief Complaint  Patient presents with   Follow-up    6 month follow up   Dyspnea:  back stops before breathing Cough: non-productive esp at hs  Sleeping: horizontal with one pillow  02:  none  SABA :  maybe2-3 days per day  Lung cancer screening: due in 11/2023  Rec Start gabapentin 300 mg at bedtime  x one week and can add one dose a week up to 4 x daily is maximum   For cough/ congestion >  mucinex dm (over the counter)   up to maximum of  1200 mg every 12 hours and use the flutter valve as much as you can   Zpak Prednisone 10 mg take  4 each am x 2 days,   2 each am x 2 days,  1 each am x 2 days and stop  Please remember to go to the  x-ray department  @  [facility] for your tests - we will call you with the results when they are available    GERD diet reviewed, bed blocks rec    Please schedule a follow up office visit in 6 weeks, call sooner if needed      11/25/2023  f/u ov/[place] office/Donie Moulton re: *** maint on ***  No chief complaint on file.   Dyspnea:  *** Cough: *** Sleeping: ***   resp cc  SABA use: *** 02: ***  Lung cancer screening: ***   No obvious day to day or daytime variability or assoc excess/ purulent sputum or mucus plugs or hemoptysis or cp or chest tightness, subjective wheeze or overt sinus or hb symptoms.    Also denies any obvious fluctuation of symptoms with weather or environmental changes or other aggravating or alleviating factors except as outlined above   No unusual exposure hx or h/o childhood pna/ asthma or knowledge of premature birth.  Current Allergies, Complete Past Medical History, Past Surgical History, Family History, and Social History were reviewed in [employer] record.  ROS  The following are not active complaints unless bolded Hoarseness, sore throat, dysphagia, dental problems, itching, sneezing,  nasal congestion or discharge of excess mucus or purulent secretions, ear ache,   fever, chills, sweats, unintended wt loss or wt gain, classically pleuritic or exertional cp,  orthopnea pnd or arm/hand swelling  or leg swelling, presyncope, palpitations, abdominal pain, anorexia, nausea,  vomiting, diarrhea  or change in bowel habits or change in bladder habits, change in stools or change in urine, dysuria, hematuria,  rash, arthralgias, visual complaints, headache,  numbness, weakness or ataxia or problems with walking or coordination,  change in mood or  memory.        No outpatient medications have been marked as taking for the 11/25/23 encounter (Appointment) with Nyoka Cowden, MD.            Past Medical History:  Diagnosis Date   Asthma    CAD (coronary artery disease)    cath 09/09/2015 20% prox RCA, 80% prox to mid LCx treated with DES (3.518 mm Xience Alpine DES), 80% mid LAD lesion treated with 3.518 mm Xience Alpine DES stent postdilated to 4.2.   Chest pain    a. negative stress echo August 2011   GERD (gastroesophageal reflux disease)    High cholesterol    History of pneumonia    a. 2014.   Hypothyroidism    Morbid obesity (HCC)    Tobacco abuse       Objective:    Wts  11/25/2023       ***  10/14/2023     231  04/08/2023       250  02/22/2023       245 08/24/2022     251  07/19/2022       250  02/19/2022         248  08/14/2021     230   03/17/21 234 lb 9.6 oz (106.4 kg)  02/13/21 234 lb (106.1 kg)  01/17/21 236 lb 3.2 oz (107.1 kg)    Vital signs reviewed  11/25/2023  - Note at rest 02 sats  ***% on ***   General appearance:    ***   Min barre  trace edema both LE***      Assessment

## 2023-11-25 ENCOUNTER — Encounter: Payer: Self-pay | Admitting: Internal Medicine

## 2023-11-25 ENCOUNTER — Ambulatory Visit: Payer: Medicaid Other | Admitting: Internal Medicine

## 2023-12-05 ENCOUNTER — Encounter: Payer: Self-pay | Admitting: Internal Medicine

## 2023-12-06 ENCOUNTER — Ambulatory Visit (HOSPITAL_COMMUNITY): Payer: Medicaid Other

## 2023-12-06 ENCOUNTER — Other Ambulatory Visit: Payer: Self-pay | Admitting: *Deleted

## 2023-12-06 DIAGNOSIS — Z87891 Personal history of nicotine dependence: Secondary | ICD-10-CM

## 2023-12-06 DIAGNOSIS — Z122 Encounter for screening for malignant neoplasm of respiratory organs: Secondary | ICD-10-CM

## 2023-12-06 NOTE — Telephone Encounter (Signed)
 New CT order has been placed. Pt scheduled at [facility] 12/25/23 8:30

## 2023-12-06 NOTE — Telephone Encounter (Signed)
 Message routed for lung screening CT rescheduling.

## 2023-12-12 ENCOUNTER — Encounter: Payer: Self-pay | Admitting: Internal Medicine

## 2023-12-12 ENCOUNTER — Encounter: Payer: Self-pay | Admitting: Cardiology

## 2023-12-13 NOTE — Telephone Encounter (Signed)
 Whoever saw him yesterday may if indicated   refer him to ENT if he doesn't already have one if they feel that's appropriate - I would have to see him office 1st if he wants me to do the referral to  be sure that's the best choice

## 2023-12-13 NOTE — Telephone Encounter (Signed)
 Patient identification verified by 2 forms. Shade Flood, RN     Called and spoke to patient  Patient states:  Micah Flesher to doctor appt yesterday and BP was 80/60. The nurse re-checked and BP was 90/70. Patient baseline is usually 110/ systolic.  - No other recorded times of hypotension.  - Last night his resting pulse was 115. No symptoms at that time.  - patient recently getting over a respiratory infection. 2/5 azithromycin and prednisone were ordered and then given second round of Doxyclycline and prednisone on 2/18th at fu appt.  - HR 99 at time of call.   Patient denies: - new/worsening SOB, vision changes, chest pain, dizziness, fatigue.               Interventions/Plan: - Information forwarded to primary cardiologist.  - Patient to record his BP and HR for the next 1-2wks and send [place] the readings.   Reviewed ED warning signs/precautions  Patient agrees with plan, no questions at this time

## 2023-12-16 NOTE — Telephone Encounter (Signed)
 I am not sure how to further assist him. His messages are all over the place and very disrespectful.  Please advise, thank you!

## 2023-12-25 ENCOUNTER — Ambulatory Visit (HOSPITAL_COMMUNITY): Payer: Medicaid Other

## 2024-01-02 ENCOUNTER — Encounter: Payer: Self-pay | Admitting: Internal Medicine

## 2024-01-05 ENCOUNTER — Ambulatory Visit
Admission: RE | Admit: 2024-01-05 | Discharge: 2024-01-05 | Disposition: A | Discharge status: Home / Self Care | Source: Ambulatory Visit | Attending: Physician Assistant | Admitting: Physician Assistant

## 2024-01-07 ENCOUNTER — Encounter: Payer: Self-pay | Admitting: *Deleted

## 2024-01-11 ENCOUNTER — Encounter: Payer: Self-pay | Admitting: Gastroenterology

## 2024-01-11 NOTE — Progress Notes (Unsigned)
 Referring Provider: Sheela Stack  Primary Care Physician:  Lovey Newcomer, PA Primary Gastroenterologist:  Dr. Marletta Lor  Chief Complaint  Patient presents with   Abdominal Pain    Stomach pains, nausea and vomiting     HPI:   James Huynh is a 63 y.o. male presenting today at the request of Sheela Stack for nausea.    Patient reports he was started on Ozempic in June last year. Stopped after 3 months due to having the most severe pain in his stomach that he has ever had. Pain resolved, but since that time, he hasn't had a good appetite, doesn't get hungry, and gets full quickly. Eating about 1/2 of what he used to. Was down to 220 but has put some weight back on. Has been forcing himself to eat and drinking more sun drop. Eating about once a day.    On Feb 5th, he got sick with a sore throat. Feb 23rd, used inhaler and caused him to vomit. For the next 2 weeks, wasn't able to eat anything but burnt toast as he was nauseated. Was on 2 different antibiotics and prednisone at that time. Nausea has resolved.    No heartburn, on omeprazole since 2012. Sore throat continues to come and go and ENT started him on pepcid at bedtime as they suspected it was reflux related. No dysphagia.    NSAIDs: Rarely.  ETOH: None. Quit 30 years ago.  Tobacco: None. Used to smoke cigars. Stopped 1 year ago.   No prior EGD or colonoscopy.   No bowel issues. Bowels move every 2 days. This is his baseline. Doesn't feel constipated. No brbpr or melena.   Has umbilical hernia that will pop out and be very tender at times. Wants to get this taken care of.     Past Medical History:  Diagnosis Date   Asthma    Bronchitis    CAD (coronary artery disease)    cath 09/09/2015 20% prox RCA, 80% prox to mid LCx treated with DES (3.518 mm Xience Alpine DES), 80% mid LAD lesion treated with 3.518 mm Xience Alpine DES stent postdilated to 4.2.   Chest pain    a. negative  stress echo August 2011   COPD (chronic obstructive pulmonary disease) (HCC)    COPD (chronic obstructive pulmonary disease) (HCC)    GERD (gastroesophageal reflux disease)    High cholesterol    History of pneumonia    a. 2014.   Hypothyroidism    Morbid obesity (HCC)    Tobacco abuse     Past Surgical History:  Procedure Laterality Date   CARDIAC CATHETERIZATION N/A 09/09/2015   Procedure: Left Heart Cath and Coronary Angiography;  Surgeon: Lennette Bihari, MD;  Location: [facility];  Service: Cardiovascular;  Laterality: N/A;   CARDIAC CATHETERIZATION  09/09/2015   Procedure: Coronary Stent Intervention;  Surgeon: Lennette Bihari, MD;  Location: [facility];  Service: Cardiovascular;;   CORONARY STENT INTERVENTION N/A 01/17/2022   Procedure: CORONARY STENT INTERVENTION;  Surgeon: Iran Ouch, MD;  Location: [facility];  Service: Cardiovascular;  Laterality: N/A;   LEFT HEART CATH AND CORONARY ANGIOGRAPHY N/A 01/17/2022   Procedure: LEFT HEART CATH AND CORONARY ANGIOGRAPHY;  Surgeon: Iran Ouch, MD;  Location: [facility];  Service: Cardiovascular;  Laterality: N/A;   lymph gland removal     MANDIBLE SURGERY     broken jaw    Current  Outpatient Medications  Medication Sig Dispense Refill   Accu-Chek Softclix Lancets lancets 2 (two) times daily.     albuterol (PROVENTIL) (2.5 MG/3ML) 0.083% nebulizer solution Take 2.5 mg by nebulization every 6 (six) hours as needed for wheezing or shortness of breath.     albuterol (VENTOLIN HFA) 108 (90 Base) MCG/ACT inhaler Inhale 2 puffs into the lungs every 6 (six) hours as needed for wheezing or shortness of breath.     aspirin EC 81 MG tablet Take 81 mg by mouth every evening.     Blood Glucose Monitoring Suppl (ACCU-CHEK GUIDE ME) w/Device KIT SMARTSIG:Via Meter Morning-Night     Blood Glucose Monitoring Suppl DEVI 1 each by Does not apply route in the morning and at bedtime. May substitute to any  manufacturer covered by patient's insurance. 1 each 0   Budeson-Glycopyrrol-Formoterol (BREZTRI AEROSPHERE) 160-9-4.8 MCG/ACT AERO Inhale 2 puffs into the lungs 2 (two) times daily. 10.7 g 5   budesonide-formoterol (SYMBICORT) 80-4.5 MCG/ACT inhaler Take 2 puffs first thing in am and then another 2 puffs about 12 hours later. 1 each 12   ezetimibe (ZETIA) 10 MG tablet Take 1 tablet (10 mg total) by mouth daily. 90 tablet 3   famotidine (PEPCID) 20 MG tablet One after supper 30 tablet 11   furosemide (LASIX) 40 MG tablet TAKE 1 TABLET BY MOUTH DAILY 90 tablet 3   gabapentin (NEURONTIN) 300 MG capsule Take 1 capsule (300 mg total) by mouth 3 (three) times daily. 90 capsule 2   Glucose Blood (BLOOD GLUCOSE TEST STRIPS) STRP 1 each by In Vitro route in the morning and at bedtime. May substitute to any manufacturer covered by patient's insurance. 100 strip 5   Lancet Device MISC 1 each by Does not apply route 2 (two) times daily. May substitute to any manufacturer covered by patient's insurance. 1 each 0   Lancets Misc. MISC Use to test blood sugar twice daily. E11.9. #100. May substitute to any manufacturer covered by patient's insurance. 100 each 0   levothyroxine (SYNTHROID) 150 MCG tablet Take 150 mcg by mouth daily.     metoprolol succinate (TOPROL-XL) 25 MG 24 hr tablet TAKE 1/2 TABLET BY MOUTH AT BEDTIME (PLEASE MAKE SURE TO COME TO YOUR NEXT APPOINTMENT) 90 tablet 3   nitroGLYCERIN (NITROSTAT) 0.4 MG SL tablet PLACE 1 TABLET UNDER THE TONGUE EVERY 5 MIN UP TO 3 TIMES AS NEEDED FOR CHEST PAIN 25 tablet 1   omeprazole (PRILOSEC) 40 MG capsule TAKE ONE CAPSULE BY MOUTH DAILY 30 capsule 2   potassium chloride (KLOR-CON) 10 MEQ tablet Take 1 tablet (10 mEq total) by mouth daily.     rosuvastatin (CRESTOR) 40 MG tablet Take 1 tablet (40 mg total) by mouth at bedtime. 90 tablet 3   No current facility-administered medications for this visit.    Allergies as of 01/13/2024 - Review Complete 01/13/2024   Allergen Reaction Noted   Penicillins Anaphylaxis 06/27/2011    Family History  Problem Relation Age of Onset   Lung cancer Mother        died in the 42's.   Heart attack Father        history of MI in his 48's with bypass surgery - now alive @ 64.   Cancer - Colon Neg Hx     Social History   Socioeconomic History   Marital status: Divorced    Spouse name: Not on file   Number of children: Not on file   Years of education:  Not on file   Highest education level: Not on file  Occupational History   Not on file  Tobacco Use   Smoking status: Former    Current packs/day: 0.00    Average packs/day: 1 pack/day for 45.5 years (45.5 ttl pk-yrs)    Types: Cigarettes, Cigars    Start date: 08/06/1975    Quit date: 01/30/2021    Years since quitting: 2.9    Passive exposure: Current   Smokeless tobacco: Never  Vaping Use   Vaping status: Never Used  Substance and Sexual Activity   Alcohol use: No    Comment: Quit 30 years ago (documented 01/13/24)   Drug use: Yes    Frequency: 1.0 times per week    Comment: occasionally smokes marijuana.-Denies 01/15/22   Sexual activity: Yes  Other Topics Concern   Not on file  Social History Narrative   Lives in [place] with his girlfriend and 89 year old father.  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   Social Drivers of Corporate investment banker Strain: Not on file  Food Insecurity: Not on file  Transportation Needs: Not on file  Physical Activity: Not on file  Stress: Not on file  Social Connections: Not on file  Intimate Partner Violence: Not on file    Review of Systems: Gen: Denies any fever, chills, or flulike symptoms, presyncope, syncope. CV: Denies chest pain, heart palpitations.  Resp: Admits to chronic shortness of breath with little exertion.  GI: See HPI GU : Denies urinary burning, urinary frequency, urinary hesitancy MS: Denies joint pain.  Derm: Denies rash. Psych: Denies  depression, anxiety. Heme: See HPI  Physical Exam: BP 102/68 (BP Location: Left Arm, Patient Position: Sitting, Cuff Size: Large)   Pulse 75   Temp 97.6 F (36.4 C) (Temporal)   Ht 5\' 10"  (1.778 m)   Wt 230 lb 12.8 oz (104.7 kg)   BMI 33.12 kg/m  General:   Alert and oriented. Pleasant and cooperative. Well-nourished and well-developed.  Head:  Normocephalic and atraumatic. Eyes:  Without icterus, sclera clear and conjunctiva pink.  Ears:  Normal auditory acuity. Lungs:  Clear to auscultation bilaterally. No wheezes, rales, or rhonchi. No distress.  Heart:  S1, S2 present without murmurs appreciated.  Abdomen:  +BS, soft, non-tender and non-distended. No HSM noted. No guarding or rebound. Soft umbilical hernia with mild tenderness.  Rectal:  Deferred  Msk:  Symmetrical without gross deformities. Normal posture. Extremities:  Without edema. Neurologic:  Alert and  oriented x4;  grossly normal neurologically. Skin:  Intact without significant lesions or rashes. Psych:  Normal mood and affect.    Assessment:  63 y.o. male with history of COPD, CAD s/p PCI with DES's, hypothyroidism, HLD, GERD, presenting today for further evaluation of black appetite, early satiety.  Lack of appetite/early satiety: New onset after starting Ozempic in June 2024.  Discontinued Ozempic September 2024, but early satiety/lack of appetite continued.  Associated weight loss while on Ozempic which continued after discontinuing the medication with a total of 30 pound weight loss, but has gained about 10 pounds back recently due to forcing himself to eat.  Had nausea and an episode of vomiting while sick with an upper respiratory illness last month, but this has resolved and denies any chronic issues with this.  Denies NSAIDs and chronic GERD is well-controlled as per below.  Etiology unclear.  Differentials include gastroparesis, gastritis, duodenitis, H. pylori, gastric outlet obstruction, less likely  malignancy.  Recommended EGD for further evaluation.  Chronic GERD: Well-controlled on omeprazole 40 mg daily.  ENT also recently started Pepcid 20 mg at bedtime due to concerns for nocturnal GERD contributing to intermittent sore throat.  Needs EGD for Barrett's esophagus screening.  Colon cancer screening: Overdue for screening.  No significant lower GI symptoms.  No rectal bleeding.  No family history of colon cancer.  Umbilical hernia Present on exam. Patient reports the hernia will intermittently protrude and occasionally is very tender to touch.   Plan:  Proceed with upper endoscopy + colonoscopy with propofol by Dr. Marletta Lor in near future. The risks, benefits, and alternatives have been discussed with the patient in detail. The patient states understanding and desires to proceed.  ASA 3 MiraLAX BID x 6 days prior to colon prep due to baseline BMs every 2 days-denies constipation.  Continue omeprazole 40 mg daily. Daily famotidine at bedtime. Refer to general surgery for umbilical hernia. Follow-up after procedures.   Ermalinda Memos, PA-C [facility] 01/13/2024

## 2024-01-13 ENCOUNTER — Encounter: Payer: Self-pay | Admitting: *Deleted

## 2024-01-13 ENCOUNTER — Other Ambulatory Visit: Payer: Self-pay | Admitting: *Deleted

## 2024-01-13 ENCOUNTER — Encounter: Payer: Self-pay | Admitting: Gastroenterology

## 2024-01-13 ENCOUNTER — Ambulatory Visit: Admitting: Gastroenterology

## 2024-01-13 VITALS — BP 102/68 | HR 75 | Temp 97.6°F | Ht 70.0 in | Wt 230.8 lb

## 2024-01-13 DIAGNOSIS — K429 Umbilical hernia without obstruction or gangrene: Secondary | ICD-10-CM

## 2024-01-13 DIAGNOSIS — K219 Gastro-esophageal reflux disease without esophagitis: Secondary | ICD-10-CM | POA: Diagnosis not present

## 2024-01-13 DIAGNOSIS — R6881 Early satiety: Secondary | ICD-10-CM

## 2024-01-13 DIAGNOSIS — Z1211 Encounter for screening for malignant neoplasm of colon: Secondary | ICD-10-CM

## 2024-01-13 DIAGNOSIS — R630 Anorexia: Secondary | ICD-10-CM | POA: Diagnosis not present

## 2024-01-13 MED ORDER — PEG 3350-KCL-NA BICARB-NACL 420 G PO SOLR
4000.0000 mL | Freq: Once | ORAL | 0 refills | Status: AC
Start: 2024-01-13 — End: 2024-01-13

## 2024-01-13 NOTE — Patient Instructions (Signed)
 We will get you scheduled for an upper endoscopy and coloscopy with Dr. Marletta Lor at [facility]   Precautions discussed continue omeprazole 40 mg daily 30 minutes before breakfast.    Continue famotidine at bedtime.  I am placing referral to general surgery for you to discuss your umbilical hernia.  I will see you back in the office after your procedures.  It was very nice to meet you today!  Ermalinda Memos, PA-C [facility]

## 2024-01-14 ENCOUNTER — Encounter: Payer: Self-pay | Admitting: Gastroenterology

## 2024-01-16 NOTE — Patient Instructions (Signed)
 James Huynh  01/16/2024     @PREFPERIOPPHARMACY @   Your procedure is scheduled on  01/22/2024.   Report to [facility] at  0815  A.M.   Call this number if you have problems the morning of surgery:  [phone]  If you experience any cold or flu symptoms such as cough, fever, chills, shortness of breath, etc. between now and your scheduled surgery, please notify us at the above number.   Remember:        DO NOT take any medications for diabetes the morning of your procedure.       Use your nebulizer and your inhalers before you come and bring your rescue inhaler with you.   Follow the diet and prep instructions given to you by the hospital.   You may drink clear liquids until 0615 am on 01/22/2024.    Clear liquids allowed are:                    Water, Juice (No red color; non-citric and without pulp; diabetics please choose diet or no sugar options), Carbonated beverages (diabetics please choose diet or no sugar options), Clear Tea (No creamer, milk, or cream, including half & half and powdered creamer), Black Coffee Only (No creamer, milk or cream, including half & half and powdered creamer), and Clear Sports drink (No red color; diabetics please choose diet or no sugar options)    Take these medicines the morning of surgery with A SIP OF WATER                  gabapentin, levothyroxine, metoprolol, omeprazole.     Do not wear jewelry, make-up or nail polish, including gel polish,  artificial nails, or any other type of covering on natural nails (fingers and  toes).  Do not wear lotions, powders, or perfumes, or deodorant.  Do not shave 48 hours prior to surgery.  Men may shave face and neck.  Do not bring valuables to the hospital.  [facility] is not responsible for any belongings or valuables.  Contacts, dentures or bridgework may not be worn into surgery.  Leave your suitcase in the car.  After surgery it may be brought to your room.  For patients  admitted to the hospital, discharge time will be determined by your treatment team.  Patients discharged the day of surgery will not be allowed to drive home and must have someone with them for 24 hours.    Special instructions:   DO NOT smoke tobacco or vape for 24 hours before your procedure.  Please read over the following fact sheets that you were given. Anesthesia Post-op Instructions and Care and Recovery After Surgery      Upper Endoscopy, Adult, Care After After the procedure, it is common to have a sore throat. It is also common to have: Mild stomach pain or discomfort. Bloating. Nausea. Follow these instructions at home: The instructions below may help you care for yourself at home. Your health care provider may give you more instructions. If you have questions, ask your health care provider. If you were given a sedative during the procedure, it can affect you for several hours. Do not drive or operate machinery until your health care provider says that it is safe. If you will be going home right after the procedure, plan to have a responsible adult: Take you home from the hospital or clinic. You will not be allowed to  drive. Care for you for the time you are told. Follow instructions from your health care provider about what you may eat and drink. Return to your normal activities as told by your health care provider. Ask your health care provider what activities are safe for you. Take over-the-counter and prescription medicines only as told by your health care provider. Contact a health care provider if you: Have a sore throat that lasts longer than one day. Have trouble swallowing. Have a fever. Get help right away if you: Vomit blood or your vomit looks like coffee grounds. Have bloody, black, or tarry stools. Have a very bad sore throat or you cannot swallow. Have difficulty breathing or very bad pain in your chest or abdomen. These symptoms may be an emergency. Get  help right away. Call 911. Do not wait to see if the symptoms will go away. Do not drive yourself to the hospital. Summary After the procedure, it is common to have a sore throat, mild stomach discomfort, bloating, and nausea. If you were given a sedative during the procedure, it can affect you for several hours. Do not drive until your health care provider says that it is safe. Follow instructions from your health care provider about what you may eat and drink. Return to your normal activities as told by your health care provider. This information is not intended to replace advice given to you by your health care provider. Make sure you discuss any questions you have with your health care provider. Document Revised: 01/10/2022 Document Reviewed: 01/10/2022 Elsevier Patient Education  2024 Else[employer] Inc.Colonoscopy, Adult, Care After The following information offers guidance on how to care for yourself after your procedure. Your health care provider may also give you more specific instructions. If you have problems or questions, contact your health care provider. What can I expect after the procedure? After the procedure, it is common to have: A small amount of blood in your stool for 24 hours after the procedure. Some gas. Mild cramping or bloating of your abdomen. Follow these instructions at home: Eating and drinking  Drink enough fluid to keep your urine pale yellow. Follow instructions from your health care provider about eating or drinking restrictions. Resume your normal diet as told by your health care provider. Avoid heavy or fried foods that are hard to digest. Activity Rest as told by your health care provider. Avoid sitting for a long time without moving. Get up to take short walks every 1-2 hours. This is important to improve blood flow and breathing. Ask for help if you feel weak or unsteady. Return to your normal activities as told by your health care provider. Ask your  health care provider what activities are safe for you. Managing cramping and bloating  Try walking around when you have cramps or feel bloated. If directed, apply heat to your abdomen as told by your health care provider. Use the heat source that your health care provider recommends, such as a moist heat pack or a heating pad. Place a towel between your skin and the heat source. Leave the heat on for 20-30 minutes. Remove the heat if your skin turns bright red. This is especially important if you are unable to feel pain, heat, or cold. You have a greater risk of getting burned. General instructions If you were given a sedative during the procedure, it can affect you for several hours. Do not drive or operate machinery until your health care provider says that it is safe. For  the first 24 hours after the procedure: Do not sign important documents. Do not drink alcohol. Do your regular daily activities at a slower pace than normal. Eat soft foods that are easy to digest. Take over-the-counter and prescription medicines only as told by your health care provider. Keep all follow-up visits. This is important. Contact a health care provider if: You have blood in your stool 2-3 days after the procedure. Get help right away if: You have more than a small spotting of blood in your stool. You have large blood clots in your stool. You have swelling of your abdomen. You have nausea or vomiting. You have a fever. You have increasing pain in your abdomen that is not relieved with medicine. These symptoms may be an emergency. Get help right away. Call 911. Do not wait to see if the symptoms will go away. Do not drive yourself to the hospital. Summary After the procedure, it is common to have a small amount of blood in your stool. You may also have mild cramping and bloating of your abdomen. If you were given a sedative during the procedure, it can affect you for several hours. Do not drive or operate  machinery until your health care provider says that it is safe. Get help right away if you have a lot of blood in your stool, nausea or vomiting, a fever, or increased pain in your abdomen. This information is not intended to replace advice given to you by your health care provider. Make sure you discuss any questions you have with your health care provider. Document Revised: 11/13/2022 Document Reviewed: 05/24/2021 Elsevier Patient Education  2024 Elsevier Inc.General Anesthesia, Adult, Care After The following information offers guidance on how to care for yourself after your procedure. Your health care provider may also give you more specific instructions. If you have problems or questions, contact your health care provider. What can I expect after the procedure? After the procedure, it is common for people to: Have pain or discomfort at the IV site. Have nausea or vomiting. Have a sore throat or hoarseness. Have trouble concentrating. Feel cold or chills. Feel weak, sleepy, or tired (fatigue). Have soreness and body aches. These can affect parts of the body that were not involved in surgery. Follow these instructions at home: For the time period you were told by your health care provider:  Rest. Do not participate in activities where you could fall or become injured. Do not drive or use machinery. Do not drink alcohol. Do not take sleeping pills or medicines that cause drowsiness. Do not make important decisions or sign legal documents. Do not take care of children on your own. General instructions Drink enough fluid to keep your urine pale yellow. If you have sleep apnea, surgery and certain medicines can increase your risk for breathing problems. Follow instructions from your health care provider about wearing your sleep device: Anytime you are sleeping, including during daytime naps. While taking prescription pain medicines, sleeping medicines, or medicines that make you  drowsy. Return to your normal activities as told by your health care provider. Ask your health care provider what activities are safe for you. Take over-the-counter and prescription medicines only as told by your health care provider. Do not use any products that contain nicotine or tobacco. These products include cigarettes, chewing tobacco, and vaping devices, such as e-cigarettes. These can delay incision healing after surgery. If you need help quitting, ask your health care provider. Contact a health care provider if: You  have nausea or vomiting that does not get better with medicine. You vomit every time you eat or drink. You have pain that does not get better with medicine. You cannot urinate or have bloody urine. You develop a skin rash. You have a fever. Get help right away if: You have trouble breathing. You have chest pain. You vomit blood. These symptoms may be an emergency. Get help right away. Call 911. Do not wait to see if the symptoms will go away. Do not drive yourself to the hospital. Summary After the procedure, it is common to have a sore throat, hoarseness, nausea, vomiting, or to feel weak, sleepy, or fatigue. For the time period you were told by your health care provider, do not drive or use machinery. Get help right away if you have difficulty breathing, have chest pain, or vomit blood. These symptoms may be an emergency. This information is not intended to replace advice given to you by your health care provider. Make sure you discuss any questions you have with your health care provider. Document Revised: 12/29/2021 Document Reviewed: 12/29/2021 Elsevier Patient Education  2024 [employer].

## 2024-01-20 ENCOUNTER — Encounter (HOSPITAL_COMMUNITY): Payer: Self-pay

## 2024-01-20 ENCOUNTER — Other Ambulatory Visit (HOSPITAL_COMMUNITY)

## 2024-01-20 ENCOUNTER — Encounter (HOSPITAL_COMMUNITY)
Admission: RE | Admit: 2024-01-20 | Discharge: 2024-01-20 | Disposition: A | Discharge status: Home / Self Care | Source: Ambulatory Visit | Attending: Internal Medicine | Admitting: Internal Medicine

## 2024-01-20 VITALS — BP 102/68 | HR 75 | Temp 97.6°F | Resp 18 | Ht 70.0 in | Wt 230.8 lb

## 2024-01-20 DIAGNOSIS — E119 Type 2 diabetes mellitus without complications: Secondary | ICD-10-CM | POA: Diagnosis present

## 2024-01-20 HISTORY — DX: Sleep apnea, unspecified: G47.30

## 2024-01-20 HISTORY — DX: Type 2 diabetes mellitus without complications: E11.9

## 2024-01-20 LAB — BASIC METABOLIC PANEL WITH GFR
Anion gap: 12 (ref 5–15)
BUN: 9 mg/dL (ref 8–23)
CO2: 27 mmol/L (ref 22–32)
Calcium: 8.9 mg/dL (ref 8.9–10.3)
Chloride: 99 mmol/L (ref 98–111)
Creatinine, Ser: 1.15 mg/dL (ref 0.61–1.24)
GFR, Estimated: >60 mL/min (ref >60)
Glucose, Bld: 104 mg/dL — ABNORMAL HIGH (ref 70–99)
Potassium: 3.9 mmol/L (ref 3.5–5.1)
Sodium: 138 mmol/L (ref 135–145)

## 2024-01-21 ENCOUNTER — Telehealth (INDEPENDENT_AMBULATORY_CARE_PROVIDER_SITE_OTHER): Payer: Self-pay | Admitting: *Deleted

## 2024-01-21 NOTE — Telephone Encounter (Signed)
 Pt called to cancel procedure for tomorrow 01/22/24 due to being sick. He will call back to reschedule once he is feeling better.

## 2024-01-21 NOTE — Progress Notes (Signed)
 James Huynh called to cancel his colonoscopy for 01/22/24.   Message sent to [employer].

## 2024-01-21 NOTE — Telephone Encounter (Signed)
 Noted.

## 2024-01-22 ENCOUNTER — Ambulatory Visit (HOSPITAL_COMMUNITY): Admission: RE | Admit: 2024-01-22 | Source: Home / Self Care | Admitting: Internal Medicine

## 2024-01-22 ENCOUNTER — Encounter (HOSPITAL_COMMUNITY): Admission: RE | Payer: Self-pay | Source: Home / Self Care

## 2024-01-22 SURGERY — COLONOSCOPY
Anesthesia: Choice

## 2024-01-28 ENCOUNTER — Ambulatory Visit (HOSPITAL_COMMUNITY)
Admission: RE | Admit: 2024-01-28 | Discharge: 2024-01-28 | Disposition: A | Discharge status: Home / Self Care | Source: Ambulatory Visit | Attending: Acute Care | Admitting: Acute Care

## 2024-01-28 DIAGNOSIS — Z122 Encounter for screening for malignant neoplasm of respiratory organs: Secondary | ICD-10-CM | POA: Insufficient documentation

## 2024-01-28 DIAGNOSIS — Z87891 Personal history of nicotine dependence: Secondary | ICD-10-CM | POA: Insufficient documentation

## 2024-01-30 ENCOUNTER — Ambulatory Visit: Admitting: Surgery

## 2024-02-10 ENCOUNTER — Ambulatory Visit: Payer: Self-pay | Admitting: Internal Medicine

## 2024-02-10 ENCOUNTER — Other Ambulatory Visit: Payer: Self-pay | Admitting: Cardiology

## 2024-02-10 ENCOUNTER — Other Ambulatory Visit: Payer: Self-pay

## 2024-02-10 ENCOUNTER — Encounter: Payer: Self-pay | Admitting: Internal Medicine

## 2024-02-10 VITALS — BP 100/80 | HR 84 | Temp 97.9°F | Resp 16 | Ht 69.29 in | Wt 235.8 lb

## 2024-02-10 DIAGNOSIS — J438 Other emphysema: Secondary | ICD-10-CM

## 2024-02-10 DIAGNOSIS — J3089 Other allergic rhinitis: Secondary | ICD-10-CM | POA: Diagnosis not present

## 2024-02-10 DIAGNOSIS — I2510 Atherosclerotic heart disease of native coronary artery without angina pectoris: Secondary | ICD-10-CM

## 2024-02-10 DIAGNOSIS — R0609 Other forms of dyspnea: Secondary | ICD-10-CM

## 2024-02-10 MED ORDER — CETIRIZINE HCL 10 MG PO TABS: 10.0000 mg | ORAL_TABLET | Freq: Every day | ORAL | 5 refills | Status: DC

## 2024-02-10 MED ORDER — FLUTICASONE PROPIONATE 50 MCG/ACT NA SUSP: 2.0000 | Freq: Every day | NASAL | 5 refills | Status: DC

## 2024-02-10 NOTE — Addendum Note (Signed)
 Addended by: Correen Bubolz on: 02/10/2024 03:25 PM   Modules accepted: Level of Service

## 2024-02-10 NOTE — Addendum Note (Signed)
 Addended by: Corinda Dibbles on: 02/10/2024 04:50 PM   Modules accepted: Orders

## 2024-02-10 NOTE — Progress Notes (Addendum)
 NEW PATIENT  Date of Service/Encounter:  02/10/24  Consult requested by: Jolynn Needy, PA   Subjective:   James Huynh (DOB: February 06, 1961) is a 63 y.o. male who presents to the clinic on 02/10/2024 with a chief complaint of sinus problems, COPD.   History obtained from: chart review and patient.   COPD: Followed by Pulm on Breztri , doing well, reports significant reduction in use of Albuterol  with it.   Rhinitis:  His main worry is about mold in his house due to water damage. Also has CAD s/p PCI.  Started around 2020.   Symptoms include: nasal congestion, rhinorrhea, and post nasal drainage, sinus infections Occurs year-round Potential triggers: not sure  Treatments tried:  Zyrtec daily Flonase  Previous allergy testing: yes many years ago in childhood.   History of sinus surgery: no Nonallergic triggers: none    Reviewed:  01/13/2024: followed by GI for GERD on PPI and Pepcid , umbilical hernia, lack of appetite/early satiety, planning for colonoscopy/endoscopy.   10/14/2023: followed by Dr Waymond Hailey for COPD Gold II, On Breztri .    PFT 05/08/2018:obstruction without significant BD response.  Also with reduced DLCO and increased TLC.    CT Lung 11/2022:  1. Lung-RADS 2, benign appearance or behavior. Continue annual screening with low-dose chest CT without contrast in 12 months. 2. There is aortic atherosclerosis, in addition to left anterior descending, left circumflex and right coronary artery stents. 3. Mild diffuse bronchial wall thickening with mild centrilobular and paraseptal emphysema; imaging findings suggestive of underlying COPD.  Past Medical History: Past Medical History:  Diagnosis Date   Asthma    Bronchitis    CAD (coronary artery disease)    cath 09/09/2015 20% prox RCA, 80% prox to mid LCx treated with DES (3.518 mm Xience Alpine DES), 80% mid LAD lesion treated with 3.518 mm Xience Alpine DES stent postdilated to 4.2.   Chest pain    a.  negative stress echo August 2011   COPD (chronic obstructive pulmonary disease) (HCC)    COPD (chronic obstructive pulmonary disease) (HCC)    Diabetes mellitus without complication (HCC)    GERD (gastroesophageal reflux disease)    High cholesterol    History of pneumonia    a. 2014.   Hypothyroidism    Morbid obesity (HCC)    Myocardial infarction (HCC) 01/2022   x 2   Recurrent upper respiratory infection (URI)    Sleep apnea    Tobacco abuse     Past Surgical History: Past Surgical History:  Procedure Laterality Date   CARDIAC CATHETERIZATION N/A 09/09/2015   Procedure: Left Heart Cath and Coronary Angiography;  Surgeon: Millicent Ally, MD;  Location: [facility];  Service: Cardiovascular;  Laterality: N/A;   CARDIAC CATHETERIZATION  09/09/2015   Procedure: Coronary Stent Intervention;  Surgeon: Millicent Ally, MD;  Location: [facility];  Service: Cardiovascular;;   CORONARY STENT INTERVENTION N/A 01/17/2022   Procedure: CORONARY STENT INTERVENTION;  Surgeon: Wenona Hamilton, MD;  Location: [facility];  Service: Cardiovascular;  Laterality: N/A;   LEFT HEART CATH AND CORONARY ANGIOGRAPHY N/A 01/17/2022   Procedure: LEFT HEART CATH AND CORONARY ANGIOGRAPHY;  Surgeon: Wenona Hamilton, MD;  Location: [facility];  Service: Cardiovascular;  Laterality: N/A;   lymph gland removal     MANDIBLE SURGERY     broken jaw    Family History: Family History  Problem Relation Age of Onset   Allergic rhinitis Mother  Lung cancer Mother        died in the 64's.   Heart attack Father        history of MI in his 59's with bypass surgery - now alive @ 66.   Cancer - Colon Neg Hx     Social History:  Flooring in bedroom: wood Pets: dog Tobacco use/exposure: prior use Job: management   Medication List:  Allergies as of 02/10/2024       Reactions   Penicillins Anaphylaxis        Medication List        Accurate as of February 10, 2024 12:26  PM. If you have any questions, ask your nurse or doctor.          Accu-Chek Softclix Lancets lancets 2 (two) times daily.   albuterol  (2.5 MG/3ML) 0.083% nebulizer solution Commonly known as: PROVENTIL  Take 2.5 mg by nebulization every 6 (six) hours as needed for wheezing or shortness of breath.   albuterol  108 (90 Base) MCG/ACT inhaler Commonly known as: VENTOLIN  HFA Inhale 2 puffs into the lungs every 6 (six) hours as needed for wheezing or shortness of breath.   aspirin  EC 81 MG tablet Take 81 mg by mouth every evening.   Blood Glucose Monitoring Suppl Devi 1 each by Does not apply route in the morning and at bedtime. May substitute to any manufacturer covered by patient's insurance.   Accu-Chek Guide Me w/Device Kit SMARTSIG:Via Meter Morning-Night   BLOOD GLUCOSE TEST STRIPS Strp 1 each by In Vitro route in the morning and at bedtime. May substitute to any manufacturer covered by patient's insurance.   Breztri  Aerosphere 160-9-4.8 MCG/ACT Aero inhaler Generic drug: budeson-glycopyrrolate -formoterol  Inhale 2 puffs into the lungs 2 (two) times daily.   budesonide -formoterol  80-4.5 MCG/ACT inhaler Commonly known as: Symbicort  Take 2 puffs first thing in am and then another 2 puffs about 12 hours later.   cetirizine 10 MG tablet Commonly known as: ZYRTEC Take 1 tablet (10 mg total) by mouth daily. What changed: when to take this Changed by: Kandice Orleans   ezetimibe  10 MG tablet Commonly known as: ZETIA  Take 1 tablet (10 mg total) by mouth daily.   famotidine  20 MG tablet Commonly known as: Pepcid  One after supper   fluticasone 50 MCG/ACT nasal spray Commonly known as: FLONASE Place 2 sprays into both nostrils daily. Started by: Kandice Orleans   furosemide  40 MG tablet Commonly known as: LASIX  TAKE 1 TABLET BY MOUTH DAILY   gabapentin  300 MG capsule Commonly known as: Neurontin  Take 1 capsule (300 mg total) by mouth 3 (three) times daily.   Lancet  Device Misc 1 each by Does not apply route 2 (two) times daily. May substitute to any manufacturer covered by patient's insurance.   Lancets Misc. Misc Use to test blood sugar twice daily. E11.9. #100. May substitute to any manufacturer covered by patient's insurance.   levothyroxine  150 MCG tablet Commonly known as: SYNTHROID  Take 150 mcg by mouth daily.   metoprolol  succinate 25 MG 24 hr tablet Commonly known as: TOPROL -XL TAKE 1/2 TABLET BY MOUTH AT BEDTIME (PLEASE MAKE SURE TO COME TO YOUR NEXT APPOINTMENT)   nitroGLYCERIN  0.4 MG SL tablet Commonly known as: NITROSTAT  PLACE 1 TABLET UNDER THE TONGUE EVERY 5 MIN UP TO 3 TIMES AS NEEDED FOR CHEST PAIN   omeprazole  40 MG capsule Commonly known as: PRILOSEC TAKE ONE CAPSULE BY MOUTH DAILY   potassium chloride  10 MEQ tablet Commonly known as: KLOR-CON  Take  1 tablet (10 mEq total) by mouth daily.   rosuvastatin  40 MG tablet Commonly known as: CRESTOR  Take 1 tablet (40 mg total) by mouth at bedtime.         REVIEW OF SYSTEMS: Pertinent positives and negatives discussed in HPI.   Objective:   Physical Exam: BP 100/80   Pulse 84   Temp 97.9 F (36.6 C)   Resp 16   Ht 5' 9.29" (1.76 m)   Wt 235 lb 12.8 oz (107 kg)   SpO2 94%   BMI 34.53 kg/m  Body mass index is 34.53 kg/m. GEN: alert, well developed HEENT: clear conjunctiva, nose with + mild inferior turbinate hypertrophy, pink nasal mucosa, slight clear rhinorrhea, + cobblestoning HEART: regular rate and rhythm, no murmur LUNGS: clear to auscultation bilaterally, no coughing, unlabored respiration ABDOMEN: soft, non distended  SKIN: no rashes or lesions  Spirometry:  Tracings reviewed. His effort: It was hard to get consistent efforts and there is a question as to whether this reflects a maximal maneuver. FVC: 2.87L, 65% predicted FEV1: 1.81L, 53% predicted FEV1/FVC ratio: 63% Interpretation: Spirometry consistent with moderate obstructive disease. Possible  restriction or air trapping.  Please see scanned spirometry results for details.   Assessment:   1. Other allergic rhinitis   2. Other emphysema (HCC)     Plan/Recommendations:  Other Allergic Rhinitis: - Due to turbinate hypertrophy, exposure to mold in home and unresponsive to over the counter meds, will perform skin testing to identify aeroallergen triggers.   - Use nasal saline rinses before nose sprays such as with Neilmed Sinus Rinse.  Use distilled water.   - Use Flonase 2 sprays each nostril daily. Aim upward and outward. - Use Zyrtec 10 mg daily.  - Of note, he is not an AIT candidate.  Has CAD s/p PCI and on beta blocker.  COPD - Follow up with Pulm. Continue Breztri  160-9-4.24mcg 2 puffs twice daily and Albuterol  as needed. Spirometry today with obstruction, possibly air trapping.  Hold all anti-histamines (Xyzal, Allegra, Zyrtec, Claritin, Benadryl , Pepcid ) 3 days prior to next visit for skin testing 1-55.      Kristen Petri, MD [facility] 

## 2024-02-10 NOTE — Patient Instructions (Addendum)
 Other Allergic Rhinitis: - Use nasal saline rinses before nose sprays such as with Neilmed Sinus Rinse.  Use distilled water.   - Use Flonase 2 sprays each nostril daily. Aim upward and outward. - Use Zyrtec 10 mg daily.   COPD - Follow up with Pulm. Continue Breztri  twice daily and Albuterol  as needed.   Hold all anti-histamines (Xyzal, Allegra, Zyrtec, Claritin, Benadryl , Pepcid ) 3 days prior to next visit.

## 2024-02-11 ENCOUNTER — Encounter: Payer: Self-pay | Admitting: Internal Medicine

## 2024-02-11 ENCOUNTER — Ambulatory Visit: Admitting: Surgery

## 2024-02-12 ENCOUNTER — Other Ambulatory Visit: Payer: Self-pay | Admitting: Internal Medicine

## 2024-02-16 ENCOUNTER — Other Ambulatory Visit: Payer: Self-pay | Admitting: Internal Medicine

## 2024-02-17 ENCOUNTER — Ambulatory Visit: Admitting: Internal Medicine

## 2024-02-17 DIAGNOSIS — J3081 Allergic rhinitis due to animal (cat) (dog) hair and dander: Secondary | ICD-10-CM | POA: Diagnosis not present

## 2024-02-17 DIAGNOSIS — J3089 Other allergic rhinitis: Secondary | ICD-10-CM

## 2024-02-17 MED ORDER — CETIRIZINE HCL 10 MG PO TABS: 10.0000 mg | ORAL_TABLET | Freq: Every day | ORAL | 1 refills | Status: AC | PRN

## 2024-02-17 MED ORDER — FLUTICASONE PROPIONATE 50 MCG/ACT NA SUSP: 2.0000 | Freq: Every morning | NASAL | 1 refills | Status: DC

## 2024-02-17 MED ORDER — AZELASTINE HCL 0.1 % NA SOLN: 2.0000 | Freq: Two times a day (BID) | NASAL | 1 refills | Status: DC

## 2024-02-17 NOTE — Patient Instructions (Addendum)
 Allergic Rhinitis: - Positive skin test 02/2024: dust mites, cats, dogs, horses, cockroach  - Use nasal saline rinses before nose sprays such as with Neilmed Sinus Rinse as needed.  Use distilled water.   - Use Flonase  2 sprays each nostril daily. Aim upward and outward. - Use Azelastine 2 sprays each nostril twice daily as needed for runny nose, drainage, sneezing, congestion. Aim upward and outward. - Use Zyrtec  10 mg daily.   COPD - Follow up with Pulm.   ALLERGEN AVOIDANCE MEASURES   Dust Mites Use central air conditioning and heat; and change the filter monthly.  Pleated filters work better than mesh filters.  Electrostatic filters may also be used; wash the filter monthly.  Window air conditioners may be used, but do not clean the air as well as a central air conditioner.  Change or wash the filter monthly. Keep windows closed.  Do not use attic fans.   Encase the mattress, box springs and pillows with zippered, dust proof covers. Wash the bed linens in hot water weekly.   Remove carpet, especially from the bedroom. Remove stuffed animals, throw pillows, dust ruffles, heavy drapes and other items that collect dust from the bedroom. Do not use a humidifier.   Use wood, vinyl or leather furniture instead of cloth furniture in the bedroom. Keep the indoor humidity at 30 - 40%.    Cockroach Limit spread of food around the house; especially keep food out of bedrooms. Keep food and garbage in closed containers with a tight lid.  Never leave food out in the kitchen.  Do not leave out pet food or dirty food bowls. Mop the kitchen floor and wash countertops at least once a week. Repair leaky pipes and faucets so there is no standing water to attract roaches. Plug up cracks in the house through which cockroaches can enter. Use bait stations and approved pesticides to reduce cockroach infestation.  Pet Dander- Cats/Dogs  Keep the pet out of your bedroom and restrict it to only a few  rooms. Be advised that keeping the pet in only one room will not limit the allergens to that room. Don't pet, hug or kiss the pet; if you do, wash your hands with soap and water. High-efficiency particulate air (HEPA) cleaners run continuously in a bedroom or living room can reduce allergen levels over time. Regular use of a high-efficiency vacuum cleaner or a central vacuum can reduce allergen levels. Giving your pet a bath at least once a week can reduce airborne allergen.

## 2024-02-17 NOTE — Progress Notes (Signed)
 FOLLOW UP Date of Service/Encounter:  02/17/24   Subjective:  James Huynh (DOB: Aug 14, 1961) is a 63 y.o. male who [facility] for follow up for skin testing.   History obtained from: chart review and patient.  Anti histamines held.   Past Medical History: Past Medical History:  Diagnosis Date   Asthma    Bronchitis    CAD (coronary artery disease)    cath 09/09/2015 20% prox RCA, 80% prox to mid LCx treated with DES (3.518 mm Xience Alpine DES), 80% mid LAD lesion treated with 3.518 mm Xience Alpine DES stent postdilated to 4.2.   Chest pain    a. negative stress echo August 2011   COPD (chronic obstructive pulmonary disease) (HCC)    COPD (chronic obstructive pulmonary disease) (HCC)    Diabetes mellitus without complication (HCC)    GERD (gastroesophageal reflux disease)    High cholesterol    History of pneumonia    a. 2014.   Hypothyroidism    Morbid obesity (HCC)    Myocardial infarction (HCC) 01/2022   x 2   Recurrent upper respiratory infection (URI)    Sleep apnea    Tobacco abuse     Objective:  There were no vitals taken for this visit. There is no height or weight on file to calculate BMI. Physical Exam: GEN: alert, well developed HEENT: clear conjunctiva, MMM LUNGS: unlabored respiration  Skin Testing:  Skin prick testing was placed, which includes aeroallergens/foods, histamine control, and saline control.  Verbal consent was obtained prior to placing test.  Patient tolerated procedure well.  Allergy testing results were read and interpreted by myself, documented by clinical staff. Adequate positive and negative control.  Positive results to:  Results discussed with patient/family.  Airborne Adult Perc - 02/17/24 0944     Time Antigen Placed 0944    Allergen Manufacturer Floyd Hutchinson    Location Back    Number of Test 55    1. Control-Buffer 50% Glycerol Negative    2. Control-Histamine 3+    3.  Bahia Negative    4. [place] Negative    5. Johnson Negative    6. [place]  Blue Negative    7. Meadow Fescue Negative    8. Perennial Rye Negative    9. Timothy Negative    10. Ragweed Mix Negative    11. Cocklebur Negative    12. Plantain,  English Negative    13. Baccharis Negative    14. Dog Fennel Negative    15. Russian Thistle Negative    16. Lamb's Quarters Negative    17. Sheep Sorrell Negative    18. Rough Pigweed Negative    19. Marsh Elder, Rough Negative    20. Mugwort, Common Negative    21. Box, Elder Negative    22. Cedar, red Negative    23. Sweet Gum Negative    24. Pecan Pollen Negative    25. Pine Mix Negative    26. Walnut, Black Pollen Negative    27. Red Mulberry Negative    28. Ash Mix Negative    29. Birch Mix Negative    30. Beech American Negative    31. Cottonwood, Guinea[place]issau Negative    32. Hickory, White Negative    33. Maple Mix Negative    34. Oak, [place] Mix Negative    36. Alternaria Alternata Negative    37. Cladosporium Herbarum Negative    38. Aspergillus Mix Negative  39. Penicillium Mix Negative    40. Bipolaris Sorokiniana (Helminthosporium) Negative    42. Mucor Plumbeus Negative    43. Fusarium Moniliforme Negative    44. Aureobasidium Pullulans (pullulara) Negative    45. Rhizopus Oryzae Negative    46. Botrytis Cinera Negative    47. Epicoccum Nigrum Negative    48. Phoma Betae Negative    49. Dust Mite Mix 3+    50. Cat Hair 10,000 BAU/ml 3+    51.  Dog Epithelia 3+    52. Mixed Feathers Negative    53. Horse Epithelia 2+    54. Cockroach, German 2+    55. Tobacco Leaf Negative              Assessment:   1. Allergic rhinitis due to dust mite   2. Allergic rhinitis due to animal hair or dander   3. Allergic rhinitis due to insect     Plan/Recommendations:  Allergic Rhinitis: - Due to turbinate hypertrophy, seasonal symptoms and unresponsive to over the counter meds, will perform skin testing to identify  aeroallergen triggers.   - He was worried about mold exposure; discussed the allergy testing does not indicate exposure, just whether you are allergic or not and his was negative for molds. Also smoking exposure is likely contributing to sinus symptoms.  - Positive skin test 02/2024: dust mites, cats, dogs, horses, cockroach  - Avoidance measures discussed. - Use nasal saline rinses before nose sprays such as with Neilmed Sinus Rinse.  Use distilled water.   - Use Flonase  2 sprays each nostril daily. Aim upward and outward. - Use Azelastine 2 sprays each nostril twice daily as needed for runny nose, drainage, sneezing, congestion. Aim upward and outward. - Use Zyrtec  10 mg daily.  - Of note, he is not an AIT candidate. Has CAD s/p PCI and on beta blocker.    COPD - Follow up with Pulm. On Breztri /Albuterol .   Return in about 4 months (around 06/19/2024).  Kristen Petri, MD [facility] 

## 2024-02-20 ENCOUNTER — Telehealth: Payer: Self-pay

## 2024-02-20 ENCOUNTER — Encounter: Payer: Self-pay | Admitting: Surgery

## 2024-02-20 ENCOUNTER — Ambulatory Visit: Admitting: Surgery

## 2024-02-20 ENCOUNTER — Telehealth: Payer: Self-pay | Admitting: *Deleted

## 2024-02-20 VITALS — BP 99/68 | HR 99 | Temp 97.8°F | Resp 18 | Ht 69.0 in | Wt 235.0 lb

## 2024-02-20 DIAGNOSIS — K429 Umbilical hernia without obstruction or gangrene: Secondary | ICD-10-CM

## 2024-02-20 DIAGNOSIS — I2510 Atherosclerotic heart disease of native coronary artery without angina pectoris: Secondary | ICD-10-CM

## 2024-02-20 DIAGNOSIS — J449 Chronic obstructive pulmonary disease, unspecified: Secondary | ICD-10-CM | POA: Diagnosis not present

## 2024-02-20 NOTE — Telephone Encounter (Signed)
 Cleared for procedure

## 2024-02-20 NOTE — Telephone Encounter (Signed)
   Pre-operative Risk Assessment    Patient Name: James Huynh  DOB: 02/16/1961 MRN: [id number]   Date of last office visit: 09/19/2023 with Dr. Lavonne Prairie Date of next office visit: None   Request for Surgical Clearance    Procedure:  XI Robotic Assisted Laparoscopic Hernia Repair, Umbilical  Date of Surgery:  Clearance TBD                                Surgeon:  Lidia Reels, DO Surgeon's Group or Practice Name:  [facility] Phone number:  [phone]    Fax number:  [phone]   Type of Clearance Requested:   - Medical  - Pharmacy:  Hold Aspirin  not indicated   Type of Anesthesia:  Not Indicated   Additional requests/questions:   [name]   02/20/2024, 2:26 PM

## 2024-02-20 NOTE — Telephone Encounter (Signed)
 Pulmonologist: Vernestine Gondola, MD Last OV:   10/14/2023  [facility] Pre-Operative Risk Assessment   General Surgeon:  Bynum Cassis Pappayliou, DO  Type of surgery being performed: XI Robotic Assisted Laparoscopic Hernia Repair, Umbilical   Anesthesia Type: (none, local, MAC, general)  TBD  Scheduled Date:  TBD  Type of Clearance Required:   Please advise on pulmonology risk for anesthesia   COPD  DOE

## 2024-02-21 ENCOUNTER — Other Ambulatory Visit: Payer: Self-pay | Admitting: Internal Medicine

## 2024-02-21 NOTE — Progress Notes (Unsigned)
 [facility] History and Physical  Reason for Referral: Umbilical hernia Referring Physician: Shana Daring, PA  Chief Complaint   New Patient (Initial Visit)     James Huynh is a 63 y.o. male.  HPI: Patient presents for evaluation of an umbilical hernia.  He states that he has had this hernia since 2000 09/2012.  He has intermittent pain at his umbilicus associated with the hernia.  The pain started a few years ago, and his last episode of severe pain associated with the hernia was several years ago.  He does note that he starts to get pain at his umbilicus when he coughs or does certain movements.  He denies nausea and vomiting, and he is having regular bowel movements.  His past medical history is significant for coronary artery disease status post 3 stents placed in April 2023, hypothyroidism, hyperlipidemia, and emphysema not on oxygen.  He last saw his cardiologist 6 months ago, and he is only taking 81 mg aspirin  daily.  He also saw Dr. Waymond Hailey with pulmonology a couple of months ago.  He denies any history of abdominal surgeries.  He smokes a cigar 1-2 times per week.  He denies use of alcohol and illicit drugs.  Past Medical History:  Diagnosis Date   Asthma    Bronchitis    CAD (coronary artery disease)    cath 09/09/2015 20% prox RCA, 80% prox to mid LCx treated with DES (3.518 mm Xience Alpine DES), 80% mid LAD lesion treated with 3.518 mm Xience Alpine DES stent postdilated to 4.2.   Chest pain    a. negative stress echo August 2011   COPD (chronic obstructive pulmonary disease) (HCC)    COPD (chronic obstructive pulmonary disease) (HCC)    Diabetes mellitus without complication (HCC)    GERD (gastroesophageal reflux disease)    High cholesterol    History of pneumonia    a. 2014.   Hypothyroidism    Morbid obesity (HCC)    Myocardial infarction (HCC) 01/2022   x 2   Recurrent upper respiratory infection (URI)    Sleep apnea    Tobacco abuse      Past Surgical History:  Procedure Laterality Date   CARDIAC CATHETERIZATION N/A 09/09/2015   Procedure: Left Heart Cath and Coronary Angiography;  Surgeon: Millicent Ally, MD;  Location: [facility];  Service: Cardiovascular;  Laterality: N/A;   CARDIAC CATHETERIZATION  09/09/2015   Procedure: Coronary Stent Intervention;  Surgeon: Millicent Ally, MD;  Location: [facility];  Service: Cardiovascular;;   CORONARY STENT INTERVENTION N/A 01/17/2022   Procedure: CORONARY STENT INTERVENTION;  Surgeon: Wenona Hamilton, MD;  Location: [facility];  Service: Cardiovascular;  Laterality: N/A;   LEFT HEART CATH AND CORONARY ANGIOGRAPHY N/A 01/17/2022   Procedure: LEFT HEART CATH AND CORONARY ANGIOGRAPHY;  Surgeon: Wenona Hamilton, MD;  Location: [facility];  Service: Cardiovascular;  Laterality: N/A;   lymph gland removal     MANDIBLE SURGERY     broken jaw    Family History  Problem Relation Age of Onset   Allergic rhinitis Mother    Lung cancer Mother        died in the 40's.   Heart attack Father        history of MI in his 45's with bypass surgery - now alive @ 84.   Cancer - Colon Neg Hx     Social History   Tobacco Use  Smoking status: Former    Current packs/day: 0.00    Average packs/day: 1 pack/day for 45.5 years (45.5 ttl pk-yrs)    Types: Cigarettes, Cigars    Start date: 08/06/1975    Quit date: 01/30/2021    Years since quitting: 3.0    Passive exposure: Current   Smokeless tobacco: Never  Vaping Use   Vaping status: Never Used  Substance Use Topics   Alcohol use: No    Comment: Quit 30 years ago (documented 01/13/24)   Drug use: Yes    Frequency: 1.0 times per week    Comment: occasionally smokes marijuana.-Denies 01/15/22    Medications: I have reviewed the patient's current medications. Allergies as of 02/20/2024       Reactions   Penicillins Anaphylaxis        Medication List        Accurate as of Feb 20, 2024 11:59  PM. If you have any questions, ask your nurse or doctor.          STOP taking these medications    gabapentin  300 MG capsule Commonly known as: Neurontin  Stopped by: Armondo Cech A Marylou Wages       TAKE these medications    Accu-Chek Softclix Lancets lancets 2 (two) times daily.   albuterol  (2.5 MG/3ML) 0.083% nebulizer solution Commonly known as: PROVENTIL  Take 2.5 mg by nebulization every 6 (six) hours as needed for wheezing or shortness of breath.   albuterol  108 (90 Base) MCG/ACT inhaler Commonly known as: VENTOLIN  HFA Inhale 2 puffs into the lungs every 6 (six) hours as needed for wheezing or shortness of breath.   aspirin  EC 81 MG tablet Take 81 mg by mouth every evening.   azelastine  0.1 % nasal spray Commonly known as: ASTELIN  Place 2 sprays into both nostrils 2 (two) times daily.   Blood Glucose Monitoring Suppl Devi 1 each by Does not apply route in the morning and at bedtime. May substitute to any manufacturer covered by patient's insurance.   Accu-Chek Guide Me w/Device Kit SMARTSIG:Via Meter Morning-Night   BLOOD GLUCOSE TEST STRIPS Strp 1 each by In Vitro route in the morning and at bedtime. May substitute to any manufacturer covered by patient's insurance.   Breztri  Aerosphere 160-9-4.8 MCG/ACT Aero inhaler Generic drug: budeson-glycopyrrolate -formoterol  INHALE TWO PUFFS INTO THE LUNGS TWICE DAILY   budesonide -formoterol  80-4.5 MCG/ACT inhaler Commonly known as: Symbicort  Take 2 puffs first thing in am and then another 2 puffs about 12 hours later.   cetirizine  10 MG tablet Commonly known as: ZYRTEC  Take 1 tablet (10 mg total) by mouth daily as needed for allergies (Can take an extra dose during flare ups.).   ezetimibe  10 MG tablet Commonly known as: ZETIA  Take 1 tablet (10 mg total) by mouth daily.   famotidine  20 MG tablet Commonly known as: Pepcid  One after supper   fluticasone  50 MCG/ACT nasal spray Commonly known as: FLONASE  Place 2  sprays into both nostrils in the morning.   furosemide  40 MG tablet Commonly known as: LASIX  TAKE 1 TABLET BY MOUTH DAILY   Lancet Device Misc 1 each by Does not apply route 2 (two) times daily. May substitute to any manufacturer covered by patient's insurance.   Lancets Misc. Misc Use to test blood sugar twice daily. E11.9. #100. May substitute to any manufacturer covered by patient's insurance.   levothyroxine  150 MCG tablet Commonly known as: SYNTHROID  Take 150 mcg by mouth daily.   metoprolol  succinate 25 MG 24 hr tablet Commonly known as:  TOPROL -XL TAKE 1/2 TABLET BY MOUTH AT BEDTIME (PLEASE MAKE SURE TO COME TO YOUR NEXT APPOINTMENT)   nitroGLYCERIN  0.4 MG SL tablet Commonly known as: NITROSTAT  DISSOLVE 1 TABLET UNDER THE TONGUE EVERY 5 MINUTES AS NEEDED FOR CHEST PAIN. DO NOT EXCEED A TOTAL OF 3 DOSES IN 15 MINUTES.   omeprazole  40 MG capsule Commonly known as: PRILOSEC TAKE ONE CAPSULE BY MOUTH DAILY   potassium chloride  10 MEQ tablet Commonly known as: KLOR-CON  Take 1 tablet (10 mEq total) by mouth daily. What changed: how much to take   rosuvastatin  40 MG tablet Commonly known as: CRESTOR  Take 1 tablet (40 mg total) by mouth at bedtime.         ROS:  Constitutional: negative for chills, fatigue, and fevers Eyes: negative for visual disturbance and pain Ears, nose, mouth, throat, and face: negative for ear drainage, sore throat, and sinus problems Respiratory: positive for cough, wheezing, and shortness of breath Cardiovascular: negative for chest pain and palpitations Gastrointestinal: negative for abdominal pain, nausea, reflux symptoms, and vomiting Genitourinary:negative for dysuria and frequency Integument/breast: negative for dryness and rash Hematologic/lymphatic: negative for bleeding and lymphadenopathy Musculoskeletal:positive for back pain, negative for neck pain Neurological: negative for dizziness and tremors Endocrine: negative for  temperature intolerance  Blood pressure 99/68, pulse 99, temperature 97.8 F (36.6 C), temperature source Oral, resp. rate 18, height 5\' 9"  (1.753 m), weight 235 lb (106.6 kg), SpO2 91%. Physical Exam Vitals reviewed.  Constitutional:      Appearance: Normal appearance.  HENT:     Head: Normocephalic and atraumatic.  Eyes:     Extraocular Movements: Extraocular movements intact.     Pupils: Pupils are equal, round, and reactive to light.  Cardiovascular:     Rate and Rhythm: Normal rate and regular rhythm.  Pulmonary:     Effort: Pulmonary effort is normal.     Breath sounds: Normal breath sounds.  Abdominal:     Comments: Abdomen soft, nondistended, no percussion tenderness, nontender to palpation; no rigidity, guarding, rebound tenderness; soft and partially reducible umbilical hernia, nontender to palpation  Musculoskeletal:        General: Normal range of motion.     Cervical back: Normal range of motion.  Skin:    General: Skin is warm and dry.  Neurological:     General: No focal deficit present.     Mental Status: He is alert and oriented to person, place, and time.  Psychiatric:        Mood and Affect: Mood normal.        Behavior: Behavior normal.     Results: No results found for this or any previous visit (from the past 48 hours).  No results found.   Assessment & Plan:  JUMAR MILFORD is a 63 y.o. male who presents evaluation of an umbilical hernia.  -I discussed the pathophysiology of umbilical hernias and we discussed the need for surgical repair -The risk and benefits of robotic assisted laparoscopic umbilical hernia repair with mesh were discussed including but not limited to bleeding, infection, injury to surrounding structures, need for additional procedures, and hernia recurrence.  After careful consideration, Bralon Cima has decided to proceed with surgery.  -Given his significant pulmonary and cardiovascular history, will obtain risk  stratification from both cardiology and pulmonology prior to scheduling surgery -Will plan to schedule surgery once we hear back from cardiology and pulmonology -Information provided to the patient regarding umbilical hernias -Advised that the patient should present to the  ED if they begin to have painful nonreducible bulge at his umbilicus, nausea, vomiting, and obstipation  All questions were answered to the satisfaction of the patient.  Note: Portions of this report may have been transcribed using voice recognition software. Every effort has been made to ensure accuracy; however, inadvertent computerized transcription errors may still be present.   Lidia Reels, DO [facility] [address] [place], [place] [address] [phone] (office)

## 2024-02-24 ENCOUNTER — Telehealth: Payer: Self-pay

## 2024-02-24 ENCOUNTER — Other Ambulatory Visit: Payer: Self-pay | Admitting: Acute Care

## 2024-02-24 DIAGNOSIS — Z122 Encounter for screening for malignant neoplasm of respiratory organs: Secondary | ICD-10-CM

## 2024-02-24 DIAGNOSIS — Z87891 Personal history of nicotine dependence: Secondary | ICD-10-CM

## 2024-02-24 NOTE — Telephone Encounter (Signed)
 med rec and consent done     Patient Consent for Virtual Visit        James Huynh has provided verbal consent on 02/24/2024 for a virtual visit (video or telephone).   CONSENT FOR VIRTUAL VISIT FOR:  James Huynh  By participating in this virtual visit I agree to the following:  I hereby voluntarily request, consent and authorize [facility] and its employed or contracted physicians, physician assistants, nurse practitioners or other licensed health care professionals (the Practitioner), to provide me with telemedicine health care services (the "Services") as deemed necessary by the treating Practitioner. I acknowledge and consent to receive the Services by the Practitioner via telemedicine. I understand that the telemedicine visit will involve communicating with the Practitioner through live audiovisual communication technology and the disclosure of certain medical information by electronic transmission. I acknowledge that I have been given the opportunity to request an in-person assessment or other available alternative prior to the telemedicine visit and am voluntarily participating in the telemedicine visit.  I understand that I have the right to withhold or withdraw my consent to the use of telemedicine in the course of my care at any time, without affecting my right to future care or treatment, and that the Practitioner or I may terminate the telemedicine visit at any time. I understand that I have the right to inspect all information obtained and/or recorded in the course of the telemedicine visit and may receive copies of available information for a reasonable fee.  I understand that some of the potential risks of receiving the Services via telemedicine include:  Delay or interruption in medical evaluation due to technological equipment failure or disruption; Information transmitted may not be sufficient (e.g. poor resolution of images) to allow for appropriate medical  decision making by the Practitioner; and/or  In rare instances, security protocols could fail, causing a breach of personal health information.  Furthermore, I acknowledge that it is my responsibility to provide information about my medical history, conditions and care that is complete and accurate to the best of my ability. I acknowledge that Practitioner's advice, recommendations, and/or decision may be based on factors not within their control, such as incomplete or inaccurate data provided by me or distortions of diagnostic images or specimens that may result from electronic transmissions. I understand that the practice of medicine is not an exact science and that Practitioner makes no warranties or guarantees regarding treatment outcomes. I acknowledge that a copy of this consent can be made available to me via my patient portal [facility] MyChart), or I can request a printed copy by calling the office of [facility].    I understand that my insurance will be billed for this visit.   I have read or had this consent read to me. I understand the contents of this consent, which adequately explains the benefits and risks of the Services being provided via telemedicine.  I have been provided ample opportunity to ask questions regarding this consent and the Services and have had my questions answered to my satisfaction. I give my informed consent for the services to be provided through the use of telemedicine in my medical care

## 2024-02-24 NOTE — Telephone Encounter (Signed)
   Name: James Huynh  DOB: Jul 29, 1961  MRN: [id number]  Primary Cardiologist: Eilleen Grates, MD   Preoperative team, please contact this patient and set up a phone call appointment for further preoperative risk assessment. Please obtain consent and complete medication review. Thank you for your help.  I confirm that guidance regarding antiplatelet and oral anticoagulation therapy has been completed and, if necessary, noted below.  Ideally aspirin  should be continued without interruption, however if the bleeding risk is too great, aspirin  may be held for 5-7 days prior to surgery. Please resume aspirin  post operatively when it is felt to be safe from a bleeding standpoint.    I also confirmed the patient resides in the state of  . As per [facility] Medical Board telemedicine laws, the patient must reside in the state in which the provider is licensed.   Morey Ar, NP 02/24/2024, 9:16 AM [facility]

## 2024-02-24 NOTE — Telephone Encounter (Signed)
 S/W pt and scheduled TELE Preop clearance appt 02/26/24.  med rec and consent done

## 2024-02-26 ENCOUNTER — Ambulatory Visit: Attending: Cardiology | Admitting: Emergency Medicine

## 2024-02-26 DIAGNOSIS — Z01810 Encounter for preprocedural cardiovascular examination: Secondary | ICD-10-CM | POA: Diagnosis present

## 2024-02-26 NOTE — H&P (Signed)
 [facility] History and Physical   Reason for Referral: Umbilical hernia Referring Physician: Shana Daring, PA   Chief Complaint   New Patient (Initial Visit)        James Huynh is a 63 y.o. male.  HPI: Patient presents for evaluation of an umbilical hernia.  He states that he has had this hernia since 2000 09/2012.  He has intermittent pain at his umbilicus associated with the hernia.  The pain started a few years ago, and his last episode of severe pain associated with the hernia was several years ago.  He does note that he starts to get pain at his umbilicus when he coughs or does certain movements.  He denies nausea and vomiting, and he is having regular bowel movements.  His past medical history is significant for coronary artery disease status post 3 stents placed in April 2023, hypothyroidism, hyperlipidemia, and emphysema not on oxygen.  He last saw his cardiologist 6 months ago, and he is only taking 81 mg aspirin  daily.  He also saw Dr. Waymond Hailey with pulmonology a couple of months ago.  He denies any history of abdominal surgeries.  He smokes a cigar 1-2 times per week.  He denies use of alcohol and illicit drugs.       Past Medical History:  Diagnosis Date   Asthma     Bronchitis     CAD (coronary artery disease)      cath 09/09/2015 20% prox RCA, 80% prox to mid LCx treated with DES (3.518 mm Xience Alpine DES), 80% mid LAD lesion treated with 3.518 mm Xience Alpine DES stent postdilated to 4.2.   Chest pain      a. negative stress echo August 2011   COPD (chronic obstructive pulmonary disease) (HCC)     COPD (chronic obstructive pulmonary disease) (HCC)     Diabetes mellitus without complication (HCC)     GERD (gastroesophageal reflux disease)     High cholesterol     History of pneumonia      a. 2014.   Hypothyroidism     Morbid obesity (HCC)     Myocardial infarction (HCC) 01/2022    x 2   Recurrent upper respiratory infection (URI)     Sleep  apnea     Tobacco abuse                 Past Surgical History:  Procedure Laterality Date   CARDIAC CATHETERIZATION N/A 09/09/2015    Procedure: Left Heart Cath and Coronary Angiography;  Surgeon: Millicent Ally, MD;  Location: [facility];  Service: Cardiovascular;  Laterality: N/A;   CARDIAC CATHETERIZATION   09/09/2015    Procedure: Coronary Stent Intervention;  Surgeon: Millicent Ally, MD;  Location: [facility];  Service: Cardiovascular;;   CORONARY STENT INTERVENTION N/A 01/17/2022    Procedure: CORONARY STENT INTERVENTION;  Surgeon: Wenona Hamilton, MD;  Location: [facility];  Service: Cardiovascular;  Laterality: N/A;   LEFT HEART CATH AND CORONARY ANGIOGRAPHY N/A 01/17/2022    Procedure: LEFT HEART CATH AND CORONARY ANGIOGRAPHY;  Surgeon: Wenona Hamilton, MD;  Location: [facility];  Service: Cardiovascular;  Laterality: N/A;   lymph gland removal       MANDIBLE SURGERY        broken jaw               Family History  Problem Relation Age of Onset   Allergic rhinitis Mother  Lung cancer Mother          died in the 70's.   Heart attack Father          history of MI in his 20's with bypass surgery - now alive @ 89.   Cancer - Colon Neg Hx            Social History  Social History         Tobacco Use   Smoking status: Former      Current packs/day: 0.00      Average packs/day: 1 pack/day for 45.5 years (45.5 ttl pk-yrs)      Types: Cigarettes, Cigars      Start date: 08/06/1975      Quit date: 01/30/2021      Years since quitting: 3.0      Passive exposure: Current   Smokeless tobacco: Never  Vaping Use   Vaping status: Never Used  Substance Use Topics   Alcohol use: No      Comment: Quit 30 years ago (documented 01/13/24)   Drug use: Yes      Frequency: 1.0 times per week      Comment: occasionally smokes marijuana.-Denies 01/15/22        Medications: I have reviewed the patient's current medications. Allergies as  of 02/20/2024         Reactions    Penicillins Anaphylaxis            Medication List           Accurate as of Feb 20, 2024 11:59 PM. If you have any questions, ask your nurse or doctor.              STOP taking these medications     gabapentin  300 MG capsule Commonly known as: Neurontin  Stopped by: Camelle Henkels A Ruxin Ransome           TAKE these medications     Accu-Chek Softclix Lancets lancets 2 (two) times daily.    albuterol  (2.5 MG/3ML) 0.083% nebulizer solution Commonly known as: PROVENTIL  Take 2.5 mg by nebulization every 6 (six) hours as needed for wheezing or shortness of breath.    albuterol  108 (90 Base) MCG/ACT inhaler Commonly known as: VENTOLIN  HFA Inhale 2 puffs into the lungs every 6 (six) hours as needed for wheezing or shortness of breath.    aspirin  EC 81 MG tablet Take 81 mg by mouth every evening.    azelastine  0.1 % nasal spray Commonly known as: ASTELIN  Place 2 sprays into both nostrils 2 (two) times daily.    Blood Glucose Monitoring Suppl Devi 1 each by Does not apply route in the morning and at bedtime. May substitute to any manufacturer covered by patient's insurance.    Accu-Chek Guide Me w/Device Kit SMARTSIG:Via Meter Morning-Night    BLOOD GLUCOSE TEST STRIPS Strp 1 each by In Vitro route in the morning and at bedtime. May substitute to any manufacturer covered by patient's insurance.    Breztri  Aerosphere 160-9-4.8 MCG/ACT Aero inhaler Generic drug: budeson-glycopyrrolate -formoterol  INHALE TWO PUFFS INTO THE LUNGS TWICE DAILY    budesonide -formoterol  80-4.5 MCG/ACT inhaler Commonly known as: Symbicort  Take 2 puffs first thing in am and then another 2 puffs about 12 hours later.    cetirizine  10 MG tablet Commonly known as: ZYRTEC  Take 1 tablet (10 mg total) by mouth daily as needed for allergies (Can take an extra dose during flare ups.).    ezetimibe  10 MG tablet Commonly known as: ZETIA  Take  1 tablet (10 mg total) by  mouth daily.    famotidine  20 MG tablet Commonly known as: Pepcid  One after supper    fluticasone  50 MCG/ACT nasal spray Commonly known as: FLONASE  Place 2 sprays into both nostrils in the morning.    furosemide  40 MG tablet Commonly known as: LASIX  TAKE 1 TABLET BY MOUTH DAILY    Lancet Device Misc 1 each by Does not apply route 2 (two) times daily. May substitute to any manufacturer covered by patient's insurance.    Lancets Misc. Misc Use to test blood sugar twice daily. E11.9. #100. May substitute to any manufacturer covered by patient's insurance.    levothyroxine  150 MCG tablet Commonly known as: SYNTHROID  Take 150 mcg by mouth daily.    metoprolol  succinate 25 MG 24 hr tablet Commonly known as: TOPROL -XL TAKE 1/2 TABLET BY MOUTH AT BEDTIME (PLEASE MAKE SURE TO COME TO YOUR NEXT APPOINTMENT)    nitroGLYCERIN  0.4 MG SL tablet Commonly known as: NITROSTAT  DISSOLVE 1 TABLET UNDER THE TONGUE EVERY 5 MINUTES AS NEEDED FOR CHEST PAIN. DO NOT EXCEED A TOTAL OF 3 DOSES IN 15 MINUTES.    omeprazole  40 MG capsule Commonly known as: PRILOSEC TAKE ONE CAPSULE BY MOUTH DAILY    potassium chloride  10 MEQ tablet Commonly known as: KLOR-CON  Take 1 tablet (10 mEq total) by mouth daily. What changed: how much to take    rosuvastatin  40 MG tablet Commonly known as: CRESTOR  Take 1 tablet (40 mg total) by mouth at bedtime.               ROS:  Constitutional: negative for chills, fatigue, and fevers Eyes: negative for visual disturbance and pain Ears, nose, mouth, throat, and face: negative for ear drainage, sore throat, and sinus problems Respiratory: positive for cough, wheezing, and shortness of breath Cardiovascular: negative for chest pain and palpitations Gastrointestinal: negative for abdominal pain, nausea, reflux symptoms, and vomiting Genitourinary:negative for dysuria and frequency Integument/breast: negative for dryness and rash Hematologic/lymphatic: negative  for bleeding and lymphadenopathy Musculoskeletal:positive for back pain, negative for neck pain Neurological: negative for dizziness and tremors Endocrine: negative for temperature intolerance   Blood pressure 99/68, pulse 99, temperature 97.8 F (36.6 C), temperature source Oral, resp. rate 18, height 5\' 9"  (1.753 m), weight 235 lb (106.6 kg), SpO2 91%. Physical Exam Vitals reviewed.  Constitutional:      Appearance: Normal appearance.  HENT:     Head: Normocephalic and atraumatic.  Eyes:     Extraocular Movements: Extraocular movements intact.     Pupils: Pupils are equal, round, and reactive to light.  Cardiovascular:     Rate and Rhythm: Normal rate and regular rhythm.  Pulmonary:     Effort: Pulmonary effort is normal.     Breath sounds: Normal breath sounds.  Abdominal:     Comments: Abdomen soft, nondistended, no percussion tenderness, nontender to palpation; no rigidity, guarding, rebound tenderness; soft and partially reducible umbilical hernia, nontender to palpation  Musculoskeletal:        General: Normal range of motion.     Cervical back: Normal range of motion.  Skin:    General: Skin is warm and dry.  Neurological:     General: No focal deficit present.     Mental Status: He is alert and oriented to person, place, and time.  Psychiatric:        Mood and Affect: Mood normal.        Behavior: Behavior normal.  Results: Lab Results Last 48 Hours  No results found for this or any previous visit (from the past 48 hours).     Imaging Results (Last 48 hours)  No results found.       Assessment & Plan:  SHAUN ETHERIDGE is a 63 y.o. male who presents evaluation of an umbilical hernia.   -I discussed the pathophysiology of umbilical hernias and we discussed the need for surgical repair -The risk and benefits of robotic assisted laparoscopic umbilical hernia repair with mesh were discussed including but not limited to bleeding, infection, injury to  surrounding structures, need for additional procedures, and hernia recurrence.  After careful consideration, Klever Aird has decided to proceed with surgery.  -Both cardiology and pulmonology have cleared the patient has an appropriate risk for surgery -Will tentatively schedule the patient for surgery on 5/21 -Information provided to the patient regarding umbilical hernias -Advised that the patient should present to the ED if they begin to have painful nonreducible bulge at his umbilicus, nausea, vomiting, and obstipation   All questions were answered to the satisfaction of the patient.   Note: Portions of this report may have been transcribed using voice recognition software. Every effort has been made to ensure accuracy; however, inadvertent computerized transcription errors may still be present.    Lidia Reels, DO [facility] [address] [place], [place] [address] [phone] (office)

## 2024-02-26 NOTE — Addendum Note (Signed)
 Addended by: Cathalene Clipper on: 02/26/2024 01:51 PM   Modules accepted: Orders

## 2024-02-26 NOTE — Progress Notes (Signed)
 Virtual Visit via Telephone Note   Because of James Huynh co-morbid illnesses, he is at least at moderate risk for complications without adequate follow up.  This format is felt to be most appropriate for this patient at this time.  Due to technical limitations with video connection (technology), today's appointment will be conducted as an audio only telehealth visit, and SHAFFER CARRY verbally agreed to proceed in this manner.   All issues noted in this document were discussed and addressed.  No physical exam could be performed with this format.  Evaluation Performed:  Preoperative cardiovascular risk assessment _____________   Date:  02/26/2024   Patient ID:  James Huynh, DOB 1961/04/01, MRN [id number] Patient Location:  Home Provider location:   Office  Primary Care Provider:  Jolynn Needy, PA Primary Cardiologist:  Eilleen Grates, MD  Chief Complaint / Patient Profile   63 y.o. y/o male with a h/o coronary artery disease, hyperlipidemia, COPD, obesity who is pending XI Robotic Assisted Laparoscopic Hernia Repair, Umbilical with [facility] by Dr. Lidia Reels, DO on date TBD and presents today for telephonic preoperative cardiovascular risk assessment.  History of Present Illness    James Huynh is a 63 y.o. male who presents via audio/video conferencing for a telehealth visit today.  Pt was last seen in cardiology clinic on 09/19/2023 by Dr. Lavonne Prairie.  At that time TRACEY WITTEMAN was doing well.  The patient is now pending procedure as outlined above. Since his last visit, he denies chest pain, shortness of breath, lower extremity edema, fatigue, palpitations, melena, hematuria, hemoptysis, diaphoresis, weakness, presyncope, syncope, orthopnea, and PND.  Today patient is doing well overall.  He denies any chest pains or any exertional symptoms.  He notes that he does have chronic shortness of breath due to his history of COPD  which does affect his daily living pretty significantly.  His shortness of breath has been stable with no evidence of exacerbation today.  He did have 1 episode of palpitations a while back however this has not reoccurred and was short in duration.  Overall he still remains fairly active, he is able to complete greater than 4 METS today.  Past Medical History    Past Medical History:  Diagnosis Date   Asthma    Bronchitis    CAD (coronary artery disease)    cath 09/09/2015 20% prox RCA, 80% prox to mid LCx treated with DES (3.518 mm Xience Alpine DES), 80% mid LAD lesion treated with 3.518 mm Xience Alpine DES stent postdilated to 4.2.   Chest pain    a. negative stress echo August 2011   COPD (chronic obstructive pulmonary disease) (HCC)    COPD (chronic obstructive pulmonary disease) (HCC)    Diabetes mellitus without complication (HCC)    GERD (gastroesophageal reflux disease)    High cholesterol    History of pneumonia    a. 2014.   Hypothyroidism    Morbid obesity (HCC)    Myocardial infarction (HCC) 01/2022   x 2   Recurrent upper respiratory infection (URI)    Sleep apnea    Tobacco abuse    Past Surgical History:  Procedure Laterality Date   CARDIAC CATHETERIZATION N/A 09/09/2015   Procedure: Left Heart Cath and Coronary Angiography;  Surgeon: Millicent Ally, MD;  Location: [facility];  Service: Cardiovascular;  Laterality: N/A;   CARDIAC CATHETERIZATION  09/09/2015   Procedure: Coronary Stent Intervention;  Surgeon: Millicent Ally, MD;  Location: [facility];  Service: Cardiovascular;;   CORONARY STENT INTERVENTION N/A 01/17/2022   Procedure: CORONARY STENT INTERVENTION;  Surgeon: Wenona Hamilton, MD;  Location: [facility];  Service: Cardiovascular;  Laterality: N/A;   LEFT HEART CATH AND CORONARY ANGIOGRAPHY N/A 01/17/2022   Procedure: LEFT HEART CATH AND CORONARY ANGIOGRAPHY;  Surgeon: Wenona Hamilton, MD;  Location: [facility];   Service: Cardiovascular;  Laterality: N/A;   lymph gland removal     MANDIBLE SURGERY     broken jaw    Allergies  Allergies  Allergen Reactions   Penicillins Anaphylaxis    Home Medications    Prior to Admission medications   Medication Sig Start Date End Date Taking? Authorizing Provider  Accu-Chek Softclix Lancets lancets 2 (two) times daily. 06/03/23   [provider]  albuterol  (PROVENTIL ) (2.5 MG/3ML) 0.083% nebulizer solution Take 2.5 mg by nebulization every 6 (six) hours as needed for wheezing or shortness of breath.    [provider]  albuterol  (VENTOLIN  HFA) 108 (90 Base) MCG/ACT inhaler Inhale 2 puffs into the lungs every 6 (six) hours as needed for wheezing or shortness of breath.    [provider]  aspirin  EC 81 MG tablet Take 81 mg by mouth every evening.    [provider]  azelastine  (ASTELIN ) 0.1 % nasal spray Place 2 sprays into both nostrils 2 (two) times daily. 02/17/24   Kandice Orleans, MD  Blood Glucose Monitoring Suppl (ACCU-CHEK GUIDE ME) w/Device KIT SMARTSIG:Via Meter Morning-Night 05/24/23   [provider]  Blood Glucose Monitoring Suppl DEVI 1 each by Does not apply route in the morning and at bedtime. May substitute to any manufacturer covered by patient's insurance. 05/24/23   Eilleen Grates, MD  budeson-glycopyrrolate -formoterol  (BREZTRI  AEROSPHERE) 160-9-4.8 MCG/ACT AERO inhaler INHALE TWO PUFFS INTO THE LUNGS TWICE DAILY 02/17/24   Diamond Formica, MD  budesonide -formoterol  (SYMBICORT ) 80-4.5 MCG/ACT inhaler Take 2 puffs first thing in am and then another 2 puffs about 12 hours later. 10/14/23   Diamond Formica, MD  cetirizine  (ZYRTEC ) 10 MG tablet Take 1 tablet (10 mg total) by mouth daily as needed for allergies (Can take an extra dose during flare ups.). 02/17/24   Kandice Orleans, MD  ezetimibe  (ZETIA ) 10 MG tablet Take 1 tablet (10 mg total) by mouth daily. 05/06/23   Eilleen Grates, MD  famotidine  (PEPCID ) 20 MG  tablet One after supper 02/22/23   Diamond Formica, MD  fluticasone  (FLONASE ) 50 MCG/ACT nasal spray Place 2 sprays into both nostrils in the morning. 02/17/24   Kandice Orleans, MD  furosemide  (LASIX ) 40 MG tablet TAKE 1 TABLET BY MOUTH DAILY 05/29/23   Eilleen Grates, MD  Glucose Blood (BLOOD GLUCOSE TEST STRIPS) STRP 1 each by In Vitro route in the morning and at bedtime. May substitute to any manufacturer covered by patient's insurance. 05/24/23   Eilleen Grates, MD  Lancet Device MISC 1 each by Does not apply route 2 (two) times daily. May substitute to any manufacturer covered by patient's insurance. 05/24/23   Eilleen Grates, MD  Lancets Misc. MISC Use to test blood sugar twice daily. E11.9. #100. May substitute to any manufacturer covered by patient's insurance. 07/31/23   Eilleen Grates, MD  levothyroxine  (SYNTHROID ) 150 MCG tablet Take 150 mcg by mouth daily. 01/02/22   [provider]  metoprolol  succinate (TOPROL -XL) 25 MG 24 hr tablet TAKE 1/2 TABLET BY MOUTH AT BEDTIME (PLEASE MAKE SURE  TO COME TO YOUR NEXT APPOINTMENT) 08/16/23   Eilleen Grates, MD  nitroGLYCERIN  (NITROSTAT ) 0.4 MG SL tablet DISSOLVE 1 TABLET UNDER THE TONGUE EVERY 5 MINUTES AS NEEDED FOR CHEST PAIN. DO NOT EXCEED A TOTAL OF 3 DOSES IN 15 MINUTES. 02/13/24   Eilleen Grates, MD  omeprazole  (PRILOSEC) 40 MG capsule TAKE ONE CAPSULE BY MOUTH DAILY 02/24/24   Wert, Michael B, MD  potassium chloride  (KLOR-CON ) 10 MEQ tablet Take 1 tablet (10 mEq total) by mouth daily. Patient taking differently: Take 20 mEq by mouth daily. 04/26/23 01/13/24  Eilleen Grates, MD  rosuvastatin  (CRESTOR ) 40 MG tablet Take 1 tablet (40 mg total) by mouth at bedtime. 04/08/23   Eilleen Grates, MD    Physical Exam    Vital Signs:  BERNALDO PENTICO does not have vital signs available for review today.  Given telephonic nature of communication, physical exam is limited. AAOx3. NAD. Normal affect.  Speech and respirations are  unlabored.  Accessory Clinical Findings    None  Assessment & Plan    1.  Preoperative Cardiovascular Risk Assessment: According to the Revised Cardiac Risk Index (RCRI), his Perioperative Risk of Major Cardiac Event is (%): 0.4. His Functional Capacity in METs is: 5.07 according to the Duke Activity Status Index (DASI). Therefore, based on ACC/AHA guidelines, patient would be at acceptable risk for the planned procedure without further cardiovascular testing.   The patient was advised that if he develops new symptoms prior to surgery to contact our office to arrange for a follow-up visit, and he verbalized understanding..cgp  Ideally aspirin  should be continued without interruption, however if the bleeding risk is too great, aspirin  may be held for 5-7 days prior to surgery. Please resume aspirin  post operatively when it is felt to be safe from a bleeding standpoint.    A copy of this note will be routed to requesting surgeon.  Time:   Today, I have spent 9 minutes with the patient with telehealth technology discussing medical history, symptoms, and management plan.     Ava Boatman, NP  02/26/2024, 11:32 AM

## 2024-02-28 NOTE — Patient Instructions (Signed)
 James Huynh  02/28/2024     @PREFPERIOPPHARMACY @   Your procedure is scheduled on 03/04/2024.   Report to [facility] at 7:10 A.M.   Call this number if you have problems the morning of surgery:   [phone]  If you experience any cold or flu symptoms such as cough, fever, chills, shortness of breath, etc. between now and your scheduled surgery, please notify us  at the above number.   Remember:   Do not eat after midnight.   You may drink clear liquids until 5:00 AM .  Clear liquids allowed are:                    Water, Clear Tea (No creamer, milk, or cream, including half & half and powdered creamer), and Clear Sports drink (No red color; diabetics please choose diet or no sugar options)    Take these medicines the morning of surgery with A SIP OF WATER : Zyrtec  Pepcid  Flonase  Synthroid  Metoprolol  Prilosec   Do not take any diabetic medications the am of the procedure.     Do not wear jewelry, make-up or nail polish, including gel polish,  artificial nails, or any other type of covering on natural nails (fingers and  toes).  Do not wear lotions, powders, or perfumes, or deodorant.  Do not shave 48 hours prior to surgery.  Men may shave face and neck.  Do not bring valuables to the hospital.  [facility] is not responsible for any belongings or valuables.  Contacts, dentures or bridgework may not be worn into surgery.  Leave your suitcase in the car.  After surgery it may be brought to your room.  For patients admitted to the hospital, discharge time will be determined by your treatment team.  Patients discharged the day of surgery will not be allowed to drive home.   Name and phone number of your driver:   Family  Special instructions:  N/A  Please read over the following fact sheets that you were given.   Care and Recovery After Surgery  Laparoscopic Inguinal Hernia Repair, Adult Laparoscopic inguinal hernia repair is a surgical procedure to repair a  small, weak spot in the groin muscles that allows fat or intestines from inside the abdomen to bulge out (inguinal hernia). This procedure may be planned, or it may be an emergency procedure. During the procedure, tissue that has bulged out is moved back into place, and the opening in the groin muscles is repaired. This is done through three small incisions in the abdomen. A thin tube with a light and camera on the end (laparoscope) is used to help perform the procedure. Tell a health care provider about: Any allergies you have. All medicines you are taking, including vitamins, herbs, eye drops, creams, and over-the-counter medicines. Any problems you or family members have had with anesthetic medicines. Any blood disorders you have. Any surgeries you have had. Any medical conditions you have. Whether you are pregnant or may be pregnant. What are the risks? Generally, this is a safe procedure. However, problems may occur, including: Infection. Bleeding. Allergic reactions to medicines. Damage to nearby structures or organs. Testicle damage or long-term pain and swelling of the scrotum, in males. Inability to completely empty the bladder (urinary retention). Blood clots. A collection of fluid that builds up under the skin (seroma). The hernia coming back (recurrence). What happens before the procedure? Staying hydrated Follow instructions from your health care provider about hydration, which may include:  Up to 2 hours before the procedure - you may continue to drink clear liquids, such as water, clear fruit juice, black coffee, and plain tea.  Eating and drinking restrictions Follow instructions from your health care provider about eating and drinking, which may include: 8 hours before the procedure - stop eating heavy meals or foods, such as meat, fried foods, or fatty foods. 6 hours before the procedure - stop eating light meals or foods, such as toast or cereal. 6 hours before the  procedure - stop drinking milk or drinks that contain milk. 2 hours before the procedure - stop drinking clear liquids. Medicines Ask your health care provider about: Changing or stopping your regular medicines. This is especially important if you are taking diabetes medicines or blood thinners. Taking medicines such as aspirin  and ibuprofen. These medicines can thin your blood. Do not take these medicines unless your health care provider tells you to take them. Taking over-the-counter medicines, vitamins, herbs, and supplements. General instructions Do not use any products that contain nicotine or tobacco for at least 4 weeks before the procedure, if possible. These products include cigarettes, chewing tobacco, and vaping devices, such as e-cigarettes. If you need help quitting, ask your health care provider. Ask your health care provider: How your surgery site will be marked. What steps will be taken to help prevent infection. These steps may include: Removing hair at the surgery site. Washing skin with a germ-killing soap. Taking antibiotic medicine. Plan to have a responsible adult take you home from the hospital or clinic. Plan to have a responsible adult care for you for the time you are told after you leave the hospital or clinic. This is important. What happens during the procedure? An IV will be inserted into one of your veins. You will be given one or more of the following: A medicine to help you relax (sedative). A medicine to make you fall asleep (general anesthetic). Three small incisions will be made in your abdomen. Your abdomen will be inflated with carbon dioxide gas to make the surgical area easier to see. A laparoscope and surgical instruments will be inserted through the incisions. The laparoscope will send images of the inside of your abdomen to a monitor in the room. Tissue that is bulging through the hernia may be removed or moved back into place. The hernia opening  will be closed with a sheet of surgical mesh. The surgical instruments and laparoscope will be removed. Your incisions will be closed with stitches (sutures) and adhesive strips. A bandage (dressing) will be placed over your incisions. The procedure may vary among health care providers and hospitals. What happens after the procedure? Your blood pressure, heart rate, breathing rate, and blood oxygen level will be monitored until you leave the hospital or clinic. You will be given pain medicine as needed. You may continue to receive medicines and fluids through an IV. The IV will be removed after you can drink fluids. You will be encouraged to get up and move around and to take deep breaths frequently. If you were given a sedative during the procedure, it can affect you for several hours. Do not drive or operate machinery until your health care provider says that it is safe. Summary Laparoscopic inguinal hernia repair is a surgical procedure to repair a small, weak spot in the groin muscles that allows fat or intestines from inside the abdomen to bulge out (inguinal hernia). This procedure is done through three small incisions in the abdomen. A  thin tube with a light and camera on the end (laparoscope) is used to help perform the procedure. After the procedure, you will be encouraged to get up and move around and to take deep breaths frequently. This information is not intended to replace advice given to you by your health care provider. Make sure you discuss any questions you have with your health care provider. Document Revised: 05/31/2020 Document Reviewed: 05/31/2020 Elsevier Patient Education  2024 [employer]  General Anesthesia, Adult General anesthesia is the use of medicine to make you fall asleep (unconscious) for a medical procedure. General anesthesia must be used for certain procedures. It is often recommended for surgery or procedures that: Last a long time. Require you to be  still or in an unusual position. Are major and can cause blood loss. Affect your breathing. The medicines used for general anesthesia are called general anesthetics. During general anesthesia, these medicines are given along with medicines that: Prevent pain. Control your blood pressure. Relax your muscles. Prevent nausea and vomiting after the procedure. Tell a health care provider about: Any allergies you have. All medicines you are taking, including vitamins, herbs, eye drops, creams, and over-the-counter medicines. Your history of any: Medical conditions you have, including: High blood pressure. Bleeding problems. Diabetes. Heart or lung conditions, such as: Heart failure. Sleep apnea. Asthma. Chronic obstructive pulmonary disease (COPD). Current or recent illnesses, such as: Upper respiratory, chest, or ear infections. Cough or fever. Tobacco or drug use, including marijuana or alcohol use. Depression or anxiety. Surgeries and types of anesthetics you have had. Problems you or family members have had with anesthetic medicines. Whether you are pregnant or may be pregnant. Whether you have any chipped or loose teeth, dentures, caps, bridgework, or issues with your mouth, swallowing, or choking. What are the risks? Your health care provider will talk with you about risks. These may include: Allergic reaction to the medicines. Lung and heart problems. Inhaling food or liquid from the stomach into the lungs (aspiration). Nerve injury. Injury to the lips, mouth, teeth, or gums. Stroke. Waking up during your procedure and being unable to move. This is rare. These problems are more likely to develop if you are having a major surgery or if you have an advanced or serious medical condition. You can prevent some of these complications by answering all of your health care provider's questions thoroughly and by following all instructions before your procedure. General anesthesia can  cause side effects, including: Nausea or vomiting. A sore throat or hoarseness from the breathing tube. Wheezing or coughing. Shaking chills or feeling cold. Body aches. Sleepiness. Confusion, agitation (delirium), or anxiety. What happens before the procedure? When to stop eating and drinking Follow instructions from your health care provider about what you may eat and drink before your procedure. If you do not follow your health care provider's instructions, your procedure may be delayed or canceled. Medicines Ask your health care provider about: Changing or stopping your regular medicines. These include any diabetes medicines or blood thinners you take. Taking medicines such as aspirin  and ibuprofen. These medicines can thin your blood. Do not take them unless your health care provider tells you to. Taking over-the-counter medicines, vitamins, herbs, and supplements. General instructions Do not use any products that contain nicotine or tobacco for at least 4 weeks before the procedure. These products include cigarettes, chewing tobacco, and vaping devices, such as e-cigarettes. If you need help quitting, ask your health care provider. If you brush your teeth on  the morning of the procedure, make sure to spit out all of the water and toothpaste. If told by your health care provider, bring your sleep apnea device with you to surgery (if applicable). If you will be going home right after the procedure, plan to have a responsible adult: Take you home from the hospital or clinic. You will not be allowed to drive. Care for you for the time you are told. What happens during the procedure?  An IV will be inserted into one of your veins. You will be given one or more of the following through a face mask or IV: A sedative. This helps you relax. Anesthesia. This will: Numb certain areas of your body. Make you fall asleep for surgery. After you are unconscious, a breathing tube may be inserted  down your throat to help you breathe. This will be removed before you wake up. An anesthesia provider, such as an anesthesiologist, will stay with you throughout your procedure. The anesthesia provider will: Keep you comfortable and safe by continuing to give you medicines and adjusting the amount of medicine that you get. Monitor your blood pressure, heart rate, and oxygen levels to make sure that the anesthetics do not cause any problems. The procedure may vary among health care providers and hospitals. What happens after the procedure? Your blood pressure, temperature, heart rate, breathing rate, and blood oxygen level will be monitored until you leave the hospital or clinic. You will wake up in a recovery area. You may wake up slowly. You may be given medicine to help you with pain, nausea, or any other side effects from the anesthesia. Summary General anesthesia is the use of medicine to make you fall asleep (unconscious) for a medical procedure. Follow your health care provider's instructions about when to stop eating, drinking, or taking certain medicines before your procedure. Plan to have a responsible adult take you home from the hospital or clinic. This information is not intended to replace advice given to you by your health care provider. Make sure you discuss any questions you have with your health care provider. Document Revised: 12/28/2021 Document Reviewed: 12/28/2021 Elsevier Patient Education  2024 [employer].  How to Use an Incentive Spirometer An incentive spirometer is a tool that measures how well you are filling your lungs with each breath. Learning to take long, deep breaths using this tool can help you keep your lungs clear and active. This may help to reverse or lessen your chance of developing breathing (pulmonary) problems, especially infection. You may be asked to use a spirometer: After a surgery. If you have a lung problem or a history of smoking. After a long  period of time when you have been unable to move or be active. If the spirometer includes an indicator to show the highest number that you have reached, your health care provider or respiratory therapist will help you set a goal. Keep a log of your progress as told by your health care provider. What are the risks? Breathing too quickly may cause dizziness or cause you to pass out. Take your time so you do not get dizzy or light-headed. If you are in pain, you may need to take pain medicine before doing incentive spirometry. It is harder to take a deep breath if you are having pain. How to use your incentive spirometer  Sit up on the edge of your bed or on a chair. Hold the incentive spirometer so that it is in an upright position. Before  you use the spirometer, breathe out normally. Place the mouthpiece in your mouth. Make sure your lips are closed tightly around it. Breathe in slowly and as deeply as you can through your mouth, causing the piston or the ball to rise toward the top of the chamber. Hold your breath for 3-5 seconds, or for as long as possible. If the spirometer includes a coach indicator, use this to guide you in breathing. Slow down your breathing if the indicator goes above the marked areas. Remove the mouthpiece from your mouth and breathe out normally. The piston or ball will return to the bottom of the chamber. Rest for a few seconds, then repeat the steps 10 or more times. Take your time and take a few normal breaths between deep breaths so that you do not get dizzy or light-headed. Do this every 1-2 hours when you are awake. If the spirometer includes a goal marker to show the highest number you have reached (best effort), use this as a goal to work toward during each repetition. After each set of 10 deep breaths, cough a few times. This will help to make sure that your lungs are clear. If you have an incision on your chest or abdomen from surgery, place a pillow or a  rolled-up towel firmly against the incision when you cough. This can help to reduce pain while taking deep breaths and coughing. General tips When you are able to get out of bed: Walk around often. Continue to take deep breaths and cough in order to clear your lungs. Keep using the incentive spirometer until your health care provider says it is okay to stop using it. If you have been in the hospital, you may be told to keep using the spirometer at home. Contact a health care provider if: You are having difficulty using the spirometer. You have trouble using the spirometer as often as instructed. Your pain medicine is not giving enough relief for you to use the spirometer as told. You have a fever. Get help right away if: You develop shortness of breath. You develop a cough with bloody mucus from the lungs. You have fluid or blood coming from an incision site after you cough. Summary An incentive spirometer is a tool that can help you learn to take long, deep breaths to keep your lungs clear and active. You may be asked to use a spirometer after a surgery, if you have a lung problem or a history of smoking, or if you have been inactive for a long period of time. Use your incentive spirometer as instructed every 1-2 hours while you are awake. If you have an incision on your chest or abdomen, place a pillow or a rolled-up towel firmly against your incision when you cough. This will help to reduce pain. Get help right away if you have shortness of breath, you cough up bloody mucus, or blood comes from your incision when you cough. This information is not intended to replace advice given to you by your health care provider. Make sure you discuss any questions you have with your health care provider. Document Revised: 08/09/2023 Document Reviewed: 08/09/2023 Elsevier Patient Education  2024 [employer]  How to Use Chlorhexidine Prepackaged Cloths at Home Chlorhexidine gluconate (CHG) is a  germ-killing (antiseptic) wash that is used to clean the skin. It can get rid of the germs that normally live on the skin and can keep them away for about 24 hours. One way to clean your skin with  CHG is by using prepackaged CHG cloths. The following information gives steps on how to use CHG cloths to clean your skin. Supplies needed: CHG cloths. How to clean your whole body with CHG cloths Follow the order of these steps unless your health care provider gives you other instructions: Use a new CHG cloth for each part of your body and after each step. Wash between any folds of skin, under your breasts, and between your fingers and toes. Front of neck, shoulders, and chest. Do not get CHG in your eyes, mouth, and ears. If CHG gets into your eyes or ears, rinse them well with water. Both arms, armpits, and hands. Stomach and groin. Right leg and foot. Left leg and foot. Back of neck and back. Do not rinse. Let your skin air dry. Do not put anything on your body afterward, such as powder or perfume. Use lotion only as told by your provider. How to clean your surgery area with CHG cloths Follow these steps when using CHG cloths to clean before surgery unless your provider gives you other instructions: Using the CHG cloths, vigorously wash the part of your body where the surgery will be done. Use a back-and-forth motion for 2 minutes. The skin should be completely wet with CHG when you are done. Do not put anything on your body afterward, such as powder, lotion, or perfume. Put on clean clothes or pajamas. If it is the night before surgery, sleep in clean sheets. General tips Only use CHG cloths as told, and follow the instructions on the label. Use the CHG cloths on clean, dry skin. Do not smoke and stay away from flames after using CHG. Your skin may feel sticky after using the CHG. This is normal. The sticky feeling will go away as the CHG dries. Do not use CHG: If you have a chlorhexidine  allergy  or have reacted to chlorhexidine in the past. On babies younger than 84 months of age. On your head, face, or genitals unless your provider tells you to. Contact a health care provider if: You have questions about using CHG. Your skin gets irritated or itchy. You have a rash after using CHG. You swallow any CHG. Call your local poison control center [phone] in the [place] Your eyes itch badly, or they become very red or swollen. Your hearing changes. You have trouble seeing. Get help right away if: You have swelling or tingling in your mouth or throat. You make high-pitched whistling sounds when you breathe, most often when you breathe out (wheeze). You have trouble breathing. These symptoms may be an emergency. Call 911right away. Do not wait to see if the symptoms will go away. Do not drive yourself to the hospital. This information is not intended to replace advice given to you by your health care provider. Make sure you discuss any questions you have with your health care provider. Document Revised: 04/16/2023 Document Reviewed: 04/12/2022 Elsevier Patient Education  2024 [employer].

## 2024-02-29 ENCOUNTER — Emergency Department (HOSPITAL_COMMUNITY)
Admission: EM | Admit: 2024-02-29 | Discharge: 2024-02-29 | Disposition: A | Discharge status: Home / Self Care | Attending: Emergency Medicine | Admitting: Emergency Medicine

## 2024-02-29 ENCOUNTER — Emergency Department (HOSPITAL_COMMUNITY)

## 2024-02-29 ENCOUNTER — Other Ambulatory Visit: Payer: Self-pay

## 2024-02-29 ENCOUNTER — Encounter (HOSPITAL_COMMUNITY): Payer: Self-pay | Admitting: Emergency Medicine

## 2024-02-29 DIAGNOSIS — Z7982 Long term (current) use of aspirin: Secondary | ICD-10-CM | POA: Insufficient documentation

## 2024-02-29 DIAGNOSIS — I2510 Atherosclerotic heart disease of native coronary artery without angina pectoris: Secondary | ICD-10-CM | POA: Diagnosis not present

## 2024-02-29 DIAGNOSIS — Z955 Presence of coronary angioplasty implant and graft: Secondary | ICD-10-CM | POA: Insufficient documentation

## 2024-02-29 DIAGNOSIS — R079 Chest pain, unspecified: Secondary | ICD-10-CM | POA: Insufficient documentation

## 2024-02-29 LAB — HEPATIC FUNCTION PANEL
ALT: 17 U/L (ref 0–44)
AST: 18 U/L (ref 15–41)
Albumin: 3.6 g/dL (ref 3.5–5.0)
Alkaline Phosphatase: 40 U/L (ref 38–126)
Bilirubin, Direct: 0.1 mg/dL (ref 0.0–0.2)
Indirect Bilirubin: 0.4 mg/dL (ref 0.3–0.9)
Total Bilirubin: 0.5 mg/dL (ref 0.0–1.2)
Total Protein: 6.4 g/dL — ABNORMAL LOW (ref 6.5–8.1)

## 2024-02-29 LAB — BASIC METABOLIC PANEL WITH GFR
Anion gap: 9 (ref 5–15)
BUN: 8 mg/dL (ref 8–23)
CO2: 25 mmol/L (ref 22–32)
Calcium: 8.5 mg/dL — ABNORMAL LOW (ref 8.9–10.3)
Chloride: 103 mmol/L (ref 98–111)
Creatinine, Ser: 1.23 mg/dL (ref 0.61–1.24)
GFR, Estimated: >60 mL/min (ref >60)
Glucose, Bld: 122 mg/dL — ABNORMAL HIGH (ref 70–99)
Potassium: 3.7 mmol/L (ref 3.5–5.1)
Sodium: 137 mmol/L (ref 135–145)

## 2024-02-29 LAB — CBC
HCT: 41.9 % (ref 39.0–52.0)
Hemoglobin: 14.6 g/dL (ref 13.0–17.0)
MCH: 31.8 pg (ref 26.0–34.0)
MCHC: 34.8 g/dL (ref 30.0–36.0)
MCV: 91.3 fL (ref 80.0–100.0)
Platelets: 223 10*3/uL (ref 150–400)
RBC: 4.59 MIL/uL (ref 4.22–5.81)
RDW: 13.5 % (ref 11.5–15.5)
WBC: 12.5 10*3/uL — ABNORMAL HIGH (ref 4.0–10.5)
nRBC: 0 % (ref 0.0–0.2)

## 2024-02-29 LAB — LIPASE, BLOOD: Lipase: 41 U/L (ref 11–51)

## 2024-02-29 LAB — TROPONIN I (HIGH SENSITIVITY)
Troponin I (High Sensitivity): 4 ng/L (ref <18)
Troponin I (High Sensitivity): 4 ng/L (ref <18)

## 2024-02-29 MED ORDER — ALUM & MAG HYDROXIDE-SIMETH 200-200-20 MG/5ML PO SUSP
30.0000 mL | Freq: Once | ORAL | Status: AC
  Administered 2024-02-29: 30 mL via ORAL
  Filled 2024-02-29: qty 30

## 2024-02-29 MED ORDER — LIDOCAINE VISCOUS HCL 2 % MT SOLN
15.0000 mL | Freq: Once | OROMUCOSAL | Status: AC
  Administered 2024-02-29: 15 mL via ORAL
  Filled 2024-02-29: qty 15

## 2024-02-29 MED ORDER — SUCRALFATE 1 GM/10ML PO SUSP
1.0000 g | Freq: Three times a day (TID) | ORAL | Status: DC
Start: 2024-02-29 — End: 2024-02-29
  Administered 2024-02-29: 1 g via ORAL
  Filled 2024-02-29: qty 10

## 2024-02-29 MED ORDER — PANTOPRAZOLE SODIUM 40 MG IV SOLR
40.0000 mg | Freq: Once | INTRAVENOUS | Status: AC
  Administered 2024-02-29: 40 mg via INTRAVENOUS
  Filled 2024-02-29: qty 10

## 2024-02-29 MED ORDER — FAMOTIDINE 20 MG PO TABS
20.0000 mg | ORAL_TABLET | Freq: Once | ORAL | Status: AC
  Administered 2024-02-29: 20 mg via ORAL
  Filled 2024-02-29: qty 1

## 2024-02-29 NOTE — ED Provider Notes (Signed)
 [place] EMERGENCY DEPARTMENT AT [facility] Provider Note   CSN: [id number] Arrival date & time: 02/29/24  0211     History {Add pertinent medical, surgical, social history, OB history to HPI:1} Chief Complaint  Patient presents with   Chest Pain    James Huynh is a 63 y.o. male.  63 year old male with history of coronary artery disease status post multiple stents in LAD and RCA and circumflex it sounds like.  Patient states usually has a heart attack he has pain in his neck or shoulders and a flushed feeling with sweatiness.  Tonight he laid down around 930 and was having some epigastric discomfort.  States it felt kind of like a gas bubble pressure.  Took some aspirin  and did not get any better.  Tried laying on his side to see if he could burp because he thought that would make him feel better.  It then improves around 130 he tried nitro twice and that also did not help and because of his history he came to the ER for further evaluation.  He has no lightheadedness, nausea, diaphoresis or dyspnea.  No other associated symptoms.  Patient otherwise compliant with medications.  Last ate around 430.  Still some discomfort now.  Blood pressure is a bit soft but he states is exactly what it always is.  He states the highest he is ever seen it has been 110 systolic.   Chest Pain      Home Medications Prior to Admission medications   Medication Sig Start Date End Date Taking? Authorizing Provider  Accu-Chek Softclix Lancets lancets 2 (two) times daily. 06/03/23   [provider]  albuterol  (PROVENTIL ) (2.5 MG/3ML) 0.083% nebulizer solution Take 2.5 mg by nebulization every 6 (six) hours as needed for wheezing or shortness of breath.    [provider]  albuterol  (VENTOLIN  HFA) 108 (90 Base) MCG/ACT inhaler Inhale 2 puffs into the lungs every 6 (six) hours as needed for wheezing or shortness of breath.    [provider]  aspirin  EC 81 MG tablet  Take 81 mg by mouth at bedtime.    [provider]  azelastine  (ASTELIN ) 0.1 % nasal spray Place 2 sprays into both nostrils 2 (two) times daily. 02/17/24   Kandice Orleans, MD  Blood Glucose Monitoring Suppl (ACCU-CHEK GUIDE ME) w/Device KIT SMARTSIG:Via Meter Morning-Night 05/24/23   [provider]  Blood Glucose Monitoring Suppl DEVI 1 each by Does not apply route in the morning and at bedtime. May substitute to any manufacturer covered by patient's insurance. 05/24/23   Eilleen Grates, MD  budeson-glycopyrrolate -formoterol  (BREZTRI  AEROSPHERE) 160-9-4.8 MCG/ACT AERO inhaler INHALE TWO PUFFS INTO THE LUNGS TWICE DAILY 02/17/24   Wert, Michael B, MD  budesonide -formoterol  (SYMBICORT ) 80-4.5 MCG/ACT inhaler Take 2 puffs first thing in am and then another 2 puffs about 12 hours later. 10/14/23   Diamond Formica, MD  cetirizine  (ZYRTEC ) 10 MG tablet Take 1 tablet (10 mg total) by mouth daily as needed for allergies (Can take an extra dose during flare ups.). Patient taking differently: Take 10 mg by mouth at bedtime. 02/17/24   Kandice Orleans, MD  cholecalciferol (VITAMIN D3) 25 MCG (1000 UNIT) tablet Take 1,000 Units by mouth daily.    [provider]  ezetimibe  (ZETIA ) 10 MG tablet Take 1 tablet (10 mg total) by mouth daily. Patient taking differently: Take 10 mg by mouth at bedtime. 05/06/23   Eilleen Grates, MD  famotidine  (PEPCID ) 20 MG  tablet One after supper 02/22/23   Diamond Formica, MD  fluticasone  (FLONASE ) 50 MCG/ACT nasal spray Place 2 sprays into both nostrils in the morning. 02/17/24   Kandice Orleans, MD  furosemide  (LASIX ) 40 MG tablet TAKE 1 TABLET BY MOUTH DAILY 05/29/23   Eilleen Grates, MD  Glucose Blood (BLOOD GLUCOSE TEST STRIPS) STRP 1 each by In Vitro route in the morning and at bedtime. May substitute to any manufacturer covered by patient's insurance. 05/24/23   Eilleen Grates, MD  Lancet Device MISC 1 each by Does not apply route 2 (two) times daily. May  substitute to any manufacturer covered by patient's insurance. 05/24/23   Eilleen Grates, MD  Lancets Misc. MISC Use to test blood sugar twice daily. E11.9. #100. May substitute to any manufacturer covered by patient's insurance. 07/31/23   Eilleen Grates, MD  levothyroxine  (SYNTHROID ) 150 MCG tablet Take 150 mcg by mouth daily. 01/02/22   [provider]  metoprolol  succinate (TOPROL -XL) 25 MG 24 hr tablet TAKE 1/2 TABLET BY MOUTH AT BEDTIME (PLEASE MAKE SURE TO COME TO YOUR NEXT APPOINTMENT) 08/16/23   Eilleen Grates, MD  nitroGLYCERIN  (NITROSTAT ) 0.4 MG SL tablet DISSOLVE 1 TABLET UNDER THE TONGUE EVERY 5 MINUTES AS NEEDED FOR CHEST PAIN. DO NOT EXCEED A TOTAL OF 3 DOSES IN 15 MINUTES. 02/13/24   Eilleen Grates, MD  omeprazole  (PRILOSEC) 40 MG capsule TAKE ONE CAPSULE BY MOUTH DAILY 02/24/24   Wert, Michael B, MD  potassium chloride  (KLOR-CON ) 10 MEQ tablet Take 1 tablet (10 mEq total) by mouth daily. Patient taking differently: Take 20 mEq by mouth daily. 04/26/23 02/26/24  Eilleen Grates, MD  rosuvastatin  (CRESTOR ) 40 MG tablet Take 1 tablet (40 mg total) by mouth at bedtime. 04/08/23   Eilleen Grates, MD      Allergies    Penicillins    Review of Systems   Review of Systems  Cardiovascular:  Positive for chest pain.    Physical Exam Updated Vital Signs BP 102/68 (BP Location: Left Arm)   Pulse 70   Temp 97.6 F (36.4 C) (Oral)   Resp 17   Ht 5\' 9"  (1.753 m)   Wt 106.6 kg   SpO2 93%   BMI 34.70 kg/m  Physical Exam Vitals and nursing note reviewed.  Constitutional:      Appearance: He is well-developed.  HENT:     Head: Normocephalic and atraumatic.  Cardiovascular:     Rate and Rhythm: Normal rate.  Pulmonary:     Effort: Pulmonary effort is normal. No respiratory distress.     Breath sounds: Decreased breath sounds present. No wheezing.  Abdominal:     General: There is no distension or abdominal bruit.     Palpations: Abdomen is soft.  Musculoskeletal:         General: Normal range of motion.     Cervical back: Normal range of motion.  Neurological:     Mental Status: He is alert.     ED Results / Procedures / Treatments   Labs (all labs ordered are listed, but only abnormal results are displayed) Labs Reviewed  BASIC METABOLIC PANEL WITH GFR - Abnormal; Notable for the following components:      Result Value   Glucose, Bld 122 (*)    Calcium  8.5 (*)    All other components within normal limits  CBC - Abnormal; Notable for the following components:   WBC 12.5 (*)    All other components within normal limits  LIPASE,  BLOOD  HEPATIC FUNCTION PANEL  TROPONIN I (HIGH SENSITIVITY)    EKG None  Radiology No results found.  Procedures Procedures    Medications Ordered in ED Medications  pantoprazole  (PROTONIX ) injection 40 mg (has no administration in time range)  alum & mag hydroxide-simeth (MAALOX/MYLANTA) 200-200-20 MG/5ML suspension 30 mL (has no administration in time range)    And  lidocaine  (XYLOCAINE ) 2 % viscous mouth solution 15 mL (has no administration in time range)    ED Course/ Medical Decision Making/ A&P                                 Medical Decision Making Amount and/or Complexity of Data Reviewed Labs: ordered. Radiology: ordered.  Risk OTC drugs. Prescription drug management.  Rule out ACS.  Seems more GI however with his history of MIs with atypical symptoms, its imperative he gets 2 troponins. ***  {Document critical care time when appropriate:1} {Document review of labs and clinical decision tools ie heart score, Chads2Vasc2 etc:1}  {Document your independent review of radiology images, and any outside records:1} {Document your discussion with family members, caretakers, and with consultants:1} {Document social determinants of health affecting pt's care:1} {Document your decision making why or why not admission, treatments were needed:1} Final Clinical Impression(s) / ED Diagnoses Final  diagnoses:  None    Rx / DC Orders ED Discharge Orders     None

## 2024-02-29 NOTE — ED Triage Notes (Signed)
 Pt arrives via POV from home with c/o chest pain pressure/tightness starting at 2100 last night and reports taking 324mg  aspirin  and 2 sublingual nitroglycerin  with minimal relief.

## 2024-02-29 NOTE — ED Notes (Signed)
 ED Provider at bedside.

## 2024-03-02 ENCOUNTER — Telehealth: Payer: Self-pay | Admitting: *Deleted

## 2024-03-02 ENCOUNTER — Other Ambulatory Visit (HOSPITAL_COMMUNITY)

## 2024-03-02 ENCOUNTER — Encounter (HOSPITAL_COMMUNITY)
Admission: RE | Admit: 2024-03-02 | Discharge: 2024-03-02 | Disposition: A | Discharge status: Home / Self Care | Source: Ambulatory Visit | Attending: Surgery | Admitting: Surgery

## 2024-03-02 DIAGNOSIS — E0800 Diabetes mellitus due to underlying condition with hyperosmolarity without nonketotic hyperglycemic-hyperosmolar coma (NKHHC): Secondary | ICD-10-CM

## 2024-03-02 NOTE — Telephone Encounter (Signed)
 Surgical Date: 03/04/2024 Procedure: XI Robotic Assisted Laparoscopic Umbilical Hernia Repair W/ Mesh, 2 CM CPT Codes: 86578 Dx: K42.9  Received call from patient [phone]~ telephone.   Patient reports that he was seen in ER over the weekend with unspecified chest pain. States that he is following up with cardiology.  Requested to defer upcoming surgery to the end of June.

## 2024-03-15 ENCOUNTER — Other Ambulatory Visit: Payer: Self-pay | Admitting: Cardiology

## 2024-03-15 DIAGNOSIS — E876 Hypokalemia: Secondary | ICD-10-CM

## 2024-04-02 ENCOUNTER — Telehealth: Payer: Self-pay | Admitting: *Deleted

## 2024-04-02 NOTE — Telephone Encounter (Signed)
 Surgical Date: 04/09/2024 Procedure: XI Robotic Assisted Laparoscopic Umbilical Hernia Repair W/ Mesh, 2 CM CPT Codes: 16109  Dx: K42.9  Patient reports that he is having issues with transportation and will need to reschedule at a later date.   Advised to contact office to schedule appointment when he wishes to proceed.   Case cancelled.

## 2024-04-07 ENCOUNTER — Encounter (HOSPITAL_COMMUNITY): Admission: RE | Admit: 2024-04-07 | Source: Ambulatory Visit

## 2024-04-07 ENCOUNTER — Other Ambulatory Visit (HOSPITAL_COMMUNITY)

## 2024-04-09 ENCOUNTER — Encounter (HOSPITAL_COMMUNITY): Admission: RE | Payer: Self-pay | Source: Home / Self Care

## 2024-04-09 ENCOUNTER — Ambulatory Visit (HOSPITAL_COMMUNITY): Admission: RE | Admit: 2024-04-09 | Source: Home / Self Care | Admitting: Surgery

## 2024-04-09 SURGERY — REPAIR, HERNIA, UMBILICAL, ROBOT-ASSISTED
Anesthesia: General | Site: Abdomen

## 2024-04-17 ENCOUNTER — Encounter: Payer: Self-pay | Admitting: Cardiology

## 2024-04-20 MED ORDER — ROSUVASTATIN CALCIUM 40 MG PO TABS: 40.0000 mg | ORAL_TABLET | Freq: Every day | ORAL | 3 refills | Status: AC

## 2024-05-12 ENCOUNTER — Telehealth: Payer: Self-pay | Admitting: Pharmacy Technician

## 2024-05-12 NOTE — Telephone Encounter (Signed)
     Ozempic  was stopped per last note

## 2024-06-15 ENCOUNTER — Other Ambulatory Visit: Payer: Self-pay | Admitting: Internal Medicine

## 2024-06-22 ENCOUNTER — Ambulatory Visit: Admitting: Internal Medicine

## 2024-07-31 ENCOUNTER — Emergency Department (HOSPITAL_COMMUNITY)

## 2024-07-31 ENCOUNTER — Other Ambulatory Visit: Payer: Self-pay

## 2024-07-31 ENCOUNTER — Emergency Department (HOSPITAL_COMMUNITY)
Admission: EM | Admit: 2024-07-31 | Discharge: 2024-08-01 | Disposition: A | Discharge status: Home / Self Care | Attending: Emergency Medicine | Admitting: Emergency Medicine

## 2024-07-31 ENCOUNTER — Encounter (HOSPITAL_COMMUNITY): Payer: Self-pay | Admitting: Emergency Medicine

## 2024-07-31 DIAGNOSIS — I2089 Other forms of angina pectoris: Secondary | ICD-10-CM

## 2024-07-31 DIAGNOSIS — Z7982 Long term (current) use of aspirin: Secondary | ICD-10-CM | POA: Diagnosis not present

## 2024-07-31 DIAGNOSIS — R079 Chest pain, unspecified: Secondary | ICD-10-CM | POA: Diagnosis present

## 2024-07-31 DIAGNOSIS — I25119 Atherosclerotic heart disease of native coronary artery with unspecified angina pectoris: Secondary | ICD-10-CM | POA: Insufficient documentation

## 2024-07-31 LAB — CBC
HCT: 40.5 % (ref 39.0–52.0)
Hemoglobin: 13.7 g/dL (ref 13.0–17.0)
MCH: 31.6 pg (ref 26.0–34.0)
MCHC: 33.8 g/dL (ref 30.0–36.0)
MCV: 93.3 fL (ref 80.0–100.0)
Platelets: 194 K/uL (ref 150–400)
RBC: 4.34 MIL/uL (ref 4.22–5.81)
RDW: 13.5 % (ref 11.5–15.5)
WBC: 9.9 K/uL (ref 4.0–10.5)
nRBC: 0 % (ref 0.0–0.2)

## 2024-07-31 NOTE — ED Triage Notes (Signed)
 Pt states he was watching TV when he started to have 6/10 chest pain that he described as annoying. Pt states he took 4 baby ASA and 1 SL NTG prior to arrival and currently denies pain.

## 2024-07-31 NOTE — ED Provider Notes (Signed)
 [place] EMERGENCY DEPARTMENT AT [facility]  Provider Note  CSN: [id number] Arrival date & time: 07/31/24 2309  History Chief Complaint  Patient presents with   Chest Pain    James Huynh is a 63 y.o. male with history of CAD, s/p stent x 3, reports onset of R sided chest pain around 2030 tonight he thought might be gas. He took his usual 81mg  ASA and went to lie down. Reports pain was worse, especially with breathing out but denies any recent cough or fever. He took additional ASA x 3 and NTG x 1 with resolution of pain. Currently he is pain free.    Home Medications Prior to Admission medications   Medication Sig Start Date End Date Taking? Authorizing Provider  Accu-Chek Softclix Lancets lancets 2 (two) times daily. 06/03/23   [provider]  albuterol  (PROVENTIL ) (2.5 MG/3ML) 0.083% nebulizer solution Take 2.5 mg by nebulization every 6 (six) hours as needed for wheezing or shortness of breath.    [provider]  albuterol  (VENTOLIN  HFA) 108 (90 Base) MCG/ACT inhaler Inhale 2 puffs into the lungs every 6 (six) hours as needed for wheezing or shortness of breath.    [provider]  aspirin  EC 81 MG tablet Take 81 mg by mouth at bedtime.    [provider]  azelastine  (ASTELIN ) 0.1 % nasal spray Place 2 sprays into both nostrils 2 (two) times daily. 02/17/24   Tobie Arleta SQUIBB, MD  Blood Glucose Monitoring Suppl (ACCU-CHEK GUIDE ME) w/Device KIT SMARTSIG:Via Meter Morning-Night 05/24/23   [provider]  Blood Glucose Monitoring Suppl DEVI 1 each by Does not apply route in the morning and at bedtime. May substitute to any manufacturer covered by patient's insurance. 05/24/23   Lavona Agent, MD  budeson-glycopyrrolate -formoterol  (BREZTRI  AEROSPHERE) 160-9-4.8 MCG/ACT AERO inhaler INHALE TWO PUFFS INTO THE LUNGS TWICE DAILY 02/17/24   Wert, Michael B, MD  budesonide -formoterol  (SYMBICORT ) 80-4.5 MCG/ACT inhaler Take 2 puffs first  thing in am and then another 2 puffs about 12 hours later. 10/14/23   Darlean Ozell KATHEE, MD  cetirizine  (ZYRTEC ) 10 MG tablet Take 1 tablet (10 mg total) by mouth daily as needed for allergies (Can take an extra dose during flare ups.). Patient taking differently: Take 10 mg by mouth at bedtime. 02/17/24   Tobie Arleta SQUIBB, MD  cholecalciferol (VITAMIN D3) 25 MCG (1000 UNIT) tablet Take 1,000 Units by mouth daily.    [provider]  ezetimibe  (ZETIA ) 10 MG tablet TAKE 1 TABLET BY MOUTH EVERY DAY 03/16/24   Lavona Agent, MD  famotidine  (PEPCID ) 20 MG tablet One after supper 02/22/23   Darlean Ozell KATHEE, MD  fluticasone  (FLONASE ) 50 MCG/ACT nasal spray Place 2 sprays into both nostrils in the morning. 02/17/24   Tobie Arleta SQUIBB, MD  furosemide  (LASIX ) 40 MG tablet TAKE 1 TABLET BY MOUTH DAILY 03/16/24   Lavona Agent, MD  Glucose Blood (BLOOD GLUCOSE TEST STRIPS) STRP 1 each by In Vitro route in the morning and at bedtime. May substitute to any manufacturer covered by patient's insurance. 05/24/23   Lavona Agent, MD  Lancet Device MISC 1 each by Does not apply route 2 (two) times daily. May substitute to any manufacturer covered by patient's insurance. 05/24/23   Lavona Agent, MD  Lancets Misc. MISC Use to test blood sugar twice daily. E11.9. #100. May substitute to any manufacturer covered by patient's insurance. 07/31/23   Lavona Agent, MD  levothyroxine  (SYNTHROID ) 150 MCG tablet  Take 150 mcg by mouth daily. 01/02/22   [provider]  metoprolol  succinate (TOPROL -XL) 25 MG 24 hr tablet TAKE 1/2 TABLET BY MOUTH AT BEDTIME (PLEASE MAKE SURE TO COME TO YOUR NEXT APPOINTMENT) 08/16/23   Lavona Agent, MD  nitroGLYCERIN  (NITROSTAT ) 0.4 MG SL tablet DISSOLVE 1 TABLET UNDER THE TONGUE EVERY 5 MINUTES AS NEEDED FOR CHEST PAIN. DO NOT EXCEED A TOTAL OF 3 DOSES IN 15 MINUTES. 02/13/24   Lavona Agent, MD  omeprazole  (PRILOSEC) 40 MG capsule TAKE ONE CAPSULE BY MOUTH DAILY 06/16/24   Wert, Michael B,  MD  potassium chloride  (KLOR-CON ) 10 MEQ tablet TAKE 2 TABLETS BY MOUTH DAILY 03/16/24   Lavona Agent, MD  rosuvastatin  (CRESTOR ) 40 MG tablet Take 1 tablet (40 mg total) by mouth at bedtime. 04/20/24   Lavona Agent, MD     Allergies    Penicillins   Review of Systems   Review of Systems Please see HPI for pertinent positives and negatives  Physical Exam BP 109/84   Pulse 73   Temp 98.1 F (36.7 C) (Oral)   Resp 16   Ht 5' 10 (1.778 m)   Wt 105.2 kg   SpO2 100%   BMI 33.29 kg/m   Physical Exam Vitals and nursing note reviewed.  Constitutional:      Appearance: Normal appearance.  HENT:     Head: Normocephalic and atraumatic.     Nose: Nose normal.     Mouth/Throat:     Mouth: Mucous membranes are moist.  Eyes:     Extraocular Movements: Extraocular movements intact.     Conjunctiva/sclera: Conjunctivae normal.  Cardiovascular:     Rate and Rhythm: Normal rate.  Pulmonary:     Effort: Pulmonary effort is normal.     Breath sounds: Normal breath sounds.  Abdominal:     General: Abdomen is flat.     Palpations: Abdomen is soft.     Tenderness: There is no abdominal tenderness.  Musculoskeletal:        General: No swelling. Normal range of motion.     Cervical back: Neck supple.     Right lower leg: No edema.     Left lower leg: No edema.  Skin:    General: Skin is warm and dry.  Neurological:     General: No focal deficit present.     Mental Status: He is alert.  Psychiatric:        Mood and Affect: Mood normal.     ED Results / Procedures / Treatments   EKG EKG Interpretation Date/Time:  Friday July 31 2024 23:32:12 EDT Ventricular Rate:  71 PR Interval:  144 QRS Duration:  135 QT Interval:  392 QTC Calculation: 426 R Axis:   57  Text Interpretation: Sinus rhythm Right bundle branch block No significant change since last tracing Confirmed by Roselyn Dunnings [phone]) on 07/31/2024 11:34:11 PM  Procedures Procedures  Medications Ordered  in the ED Medications - No data to display  Initial Impression and Plan  Patient with known CAD here for chest pain at rest, resolved with NTG. Currently pain free. BP is soft, but this is baseline for him. Will check labs and CXR. EKG without acute ischemic changes.   ED Course   Clinical Course as of 08/01/24 0233  Sat Aug 01, 2024  0009 CBC is normal. I personally viewed the images from radiology studies and agree with radiologist interpretation: CXR is clear [CS]  0018 BMP and initial Trop are neg.  [  CS]  0231 Delta trop remains normal. Patient continues to be pain free. Given his cardiac history, discussed inpatient management but he wants to go home. He will call Cardiology on Monday for follow up. RTED for any worsening pain or other concerns. He has NTG at home if needed.  [CS]    Clinical Course User Index [CS] Roselyn Carlin NOVAK, MD     MDM Rules/Calculators/A&P Medical Decision Making Problems Addressed: Angina at rest: acute illness or injury  Amount and/or Complexity of Data Reviewed Labs: ordered. Decision-making details documented in ED Course. Radiology: ordered and independent interpretation performed. Decision-making details documented in ED Course. ECG/medicine tests: ordered and independent interpretation performed. Decision-making details documented in ED Course.  Risk Prescription drug management. Decision regarding hospitalization.     Final Clinical Impression(s) / ED Diagnoses Final diagnoses:  Angina at rest    Rx / DC Orders ED Discharge Orders          Ordered    Ambulatory referral to Cardiology       Comments: If you have not heard from the Cardiology office within the next 72 hours please call [phone].   08/01/24 0232             Roselyn Carlin NOVAK, MD 08/01/24 [phone]

## 2024-08-01 LAB — BASIC METABOLIC PANEL WITH GFR
Anion gap: 9 (ref 5–15)
BUN: 9 mg/dL (ref 8–23)
CO2: 28 mmol/L (ref 22–32)
Calcium: 8.2 mg/dL — ABNORMAL LOW (ref 8.9–10.3)
Chloride: 102 mmol/L (ref 98–111)
Creatinine, Ser: 1.1 mg/dL (ref 0.61–1.24)
GFR, Estimated: >60 mL/min (ref >60)
Glucose, Bld: 115 mg/dL — ABNORMAL HIGH (ref 70–99)
Potassium: 3.3 mmol/L — ABNORMAL LOW (ref 3.5–5.1)
Sodium: 140 mmol/L (ref 135–145)

## 2024-08-01 LAB — TROPONIN T, HIGH SENSITIVITY
Troponin T High Sensitivity: 15 ng/L (ref 0–19)
Troponin T High Sensitivity: 16 ng/L (ref 0–19)

## 2024-08-03 ENCOUNTER — Telehealth: Payer: Self-pay | Admitting: Cardiology

## 2024-08-03 NOTE — Telephone Encounter (Signed)
 Spoke to patient and he reports that he was recently in the emergency over the weekend. Pt is not currently having chest pain. Pt reports that he will let us  know if he has to go back to the emergency room, but want Dr. Lavona to be aware of his recent visit. Pt requested to see Dr. Lavona and appointment has been made for 10/29.

## 2024-08-03 NOTE — Telephone Encounter (Signed)
   Pt c/o of Chest Pain: STAT if active CP, including tightness, pressure, jaw pain, radiating pain to shoulder/upper arm/back, CP unrelieved by Nitro. Symptoms reported of SOB, nausea, vomiting, sweating.  1. Are you having CP right now?     2. Are you experiencing any other symptoms (ex. SOB, nausea, vomiting, sweating)?    3. Is your CP continuous or coming and going?       4. Have you taken Nitroglycerin ?    5. How long have you been experiencing CP?     6. If NO CP at time of call then end call with telling Pt to call back or call 911 if Chest pain returns prior to return call from triage team.  Per mychart message: Pain in chest but more on right side of sternum.went to er on Friday review results

## 2024-08-10 ENCOUNTER — Ambulatory Visit: Admitting: Internal Medicine

## 2024-08-11 NOTE — Progress Notes (Unsigned)
 Cardiology Office Note:   Date:  08/11/2024  ID:  James Huynh, DOB [birth year], MRN [id number] PCP: Jolee Elsie RAMAN, [place]  [facility] Providers Cardiologist:  Lynwood Schilling, MD {  History of Present Illness:   James Huynh is a 63 y.o. male who presents for follow up of CAD.  Cardiac cath 2016 20% proximal RCA, 80% proximal to mid circumflex with DES, 80% mid LAD with DES.  He was hospitalized from 01/15/2022-01/18/2022 in the setting of NSTEMI. Echocardiogram showed EF 60 to 65%, no RWMA, no significant valvular abnormalities. Cardiac catheterization on 01/17/2022 showed mLCX 60% (ISR), pLAD 30% (ISR), pRCA 99-0% s/p aspiration thrombectomy, DES, and p-mRCA 20%.    Since he was last seen he has had chest pain.  This was right sided.  He actually was in the emergency room and I reviewed these records for this visit.  This was July 31, 2024.  He had chest discomfort that was across the right side and seem to be with exhalation.  This happened 1 night and he took 4 baby aspirin  without relief.  He took a nitroglycerin  and actually had relief.  However, in the emergency room there was no objective evidence of ischemia on the EKG and enzymes were negative.  He has noticed some increase shortness of breath slightly climbing up the hill with garbage cans in his yard.  This has been stable to slowly progressive.  He is not describing resting shortness of breath, PND or orthopnea.  He is not having any new palpitations, presyncope or syncope.  He has had no weight gain (or loss) or edema.  ROS: As stated in the HPI and negative for all other systems.  Studies Reviewed:    EKG:     Sinus rhythm, rate 71, axis within normal limits, intervals within normal limits, nonspecific anterior T wave changes, RSR prime V1 and V2, no acute ST-T wave changes.  07/31/2024.  Risk Assessment/Calculations:       Physical Exam:   VS:  There were no vitals taken for this visit.   Wt Readings from Last  3 Encounters:  07/31/24 232 lb (105.2 kg)  02/29/24 235 lb (106.6 kg)  02/20/24 235 lb (106.6 kg)     GEN: Well nourished, well developed in no acute distress NECK: No JVD; No carotid bruits CARDIAC: RRR, no murmurs, rubs, gallops RESPIRATORY:  Clear to auscultation without rales, wheezing or rhonchi  ABDOMEN: Soft, non-tender, non-distended EXTREMITIES:  No edema; No deformity   ASSESSMENT AND PLAN:   CAD in native artery : The patient has symptoms of chest discomfort that is somewhat more nonanginal sounding than anginal but given his significant coronary disease previously I am going to need to screen him.  Because of his dyspnea he would be able to walk on a treadmill.  I will order PET testing.  Hypokalemia: Of note he was hypokalemic in the emergency room and this was not supplemented.  I will check another Chem-8 today.   Hyperlipidemia : LDL was 46 last year with an HDL of 32.  This would be at target but I will check another lipid profile with an LP(a) today.   Chronic obstructive pulmonary disease: He is followed by his primary provider.   Morbid obesity (HCC):   We talked about weight loss before.  I encouraged continued diet and activity.      Follow up with me in one year or sooner pending the results of the testing or progressive symptoms.  Signed, Lynwood Schilling, MD

## 2024-08-12 ENCOUNTER — Emergency Department (HOSPITAL_COMMUNITY)
Admission: EM | Admit: 2024-08-12 | Discharge: 2024-08-13 | Disposition: A | Discharge status: Home / Self Care | Attending: Emergency Medicine | Admitting: Emergency Medicine

## 2024-08-12 ENCOUNTER — Encounter (HOSPITAL_COMMUNITY): Payer: Self-pay | Admitting: Emergency Medicine

## 2024-08-12 ENCOUNTER — Emergency Department (HOSPITAL_COMMUNITY)

## 2024-08-12 ENCOUNTER — Other Ambulatory Visit: Payer: Self-pay | Admitting: Internal Medicine

## 2024-08-12 ENCOUNTER — Encounter: Payer: Self-pay | Admitting: Cardiology

## 2024-08-12 ENCOUNTER — Other Ambulatory Visit: Payer: Self-pay

## 2024-08-12 ENCOUNTER — Ambulatory Visit: Attending: Cardiology | Admitting: Cardiology

## 2024-08-12 VITALS — BP 96/64 | HR 78 | Ht 69.0 in | Wt 232.0 lb

## 2024-08-12 DIAGNOSIS — Y9301 Activity, walking, marching and hiking: Secondary | ICD-10-CM | POA: Diagnosis not present

## 2024-08-12 DIAGNOSIS — W19XXXA Unspecified fall, initial encounter: Secondary | ICD-10-CM | POA: Insufficient documentation

## 2024-08-12 DIAGNOSIS — Z7982 Long term (current) use of aspirin: Secondary | ICD-10-CM | POA: Diagnosis not present

## 2024-08-12 DIAGNOSIS — M79622 Pain in left upper arm: Secondary | ICD-10-CM | POA: Diagnosis not present

## 2024-08-12 DIAGNOSIS — R079 Chest pain, unspecified: Secondary | ICD-10-CM | POA: Insufficient documentation

## 2024-08-12 DIAGNOSIS — S99922A Unspecified injury of left foot, initial encounter: Secondary | ICD-10-CM | POA: Diagnosis present

## 2024-08-12 DIAGNOSIS — E785 Hyperlipidemia, unspecified: Secondary | ICD-10-CM | POA: Diagnosis not present

## 2024-08-12 DIAGNOSIS — J449 Chronic obstructive pulmonary disease, unspecified: Secondary | ICD-10-CM | POA: Insufficient documentation

## 2024-08-12 DIAGNOSIS — R072 Precordial pain: Secondary | ICD-10-CM | POA: Diagnosis present

## 2024-08-12 DIAGNOSIS — Y92009 Unspecified place in unspecified non-institutional (private) residence as the place of occurrence of the external cause: Secondary | ICD-10-CM | POA: Diagnosis not present

## 2024-08-12 DIAGNOSIS — S92122A Displaced fracture of body of left talus, initial encounter for closed fracture: Secondary | ICD-10-CM | POA: Diagnosis not present

## 2024-08-12 DIAGNOSIS — I2510 Atherosclerotic heart disease of native coronary artery without angina pectoris: Secondary | ICD-10-CM | POA: Insufficient documentation

## 2024-08-12 MED ORDER — HYDROCODONE-ACETAMINOPHEN 5-325 MG PO TABS: 1.0000 | ORAL_TABLET | Freq: Four times a day (QID) | ORAL | 0 refills | Status: DC | PRN

## 2024-08-12 MED ORDER — HYDROCODONE-ACETAMINOPHEN 5-325 MG PO TABS
2.0000 | ORAL_TABLET | Freq: Once | ORAL | Status: AC
  Administered 2024-08-12: 2 via ORAL
  Filled 2024-08-12: qty 2

## 2024-08-12 NOTE — ED Triage Notes (Signed)
 Pt reports slipping & falling on porch today. Reports left ankle and left arm pain. Reports he is unable to bear wt.

## 2024-08-12 NOTE — ED Provider Notes (Signed)
 [place] [facility] AT [address] [facility] Provider Note   CSN: [id number] Arrival date & time: 08/12/24  2150     Patient presents with: James Huynh is a 63 y.o. male.   The history is provided by the patient.  Patient with a history of CAD presents after accidental fall with left foot injury. Patient reports around 4 or 4:30 PM earlier, he was walking down a wet wheelchair ramp at his house when he slipped and fell backwards and twisting his left foot and leg.  No head injury or LOC.  No neck or back pain.  He has mild pain in his left upper extremity, and pain and swelling in his left ankle and foot.  He reports it took several hours before the pain worsened and he is unable to ambulate.  Denies any neck back or hip pain. He is not on anticoagulation     Prior to Admission medications   Medication Sig Start Date End Date Taking? Authorizing Provider  HYDROcodone -acetaminophen  (NORCO/VICODIN) 5-325 MG tablet Take 1 tablet by mouth every 6 (six) hours as needed. 08/12/24  Yes Midge Golas, MD  Accu-Chek Softclix Lancets lancets 2 (two) times daily. 06/03/23   [provider]  albuterol  (PROVENTIL ) (2.5 MG/3ML) 0.083% nebulizer solution Take 2.5 mg by nebulization every 6 (six) hours as needed for wheezing or shortness of breath.    [provider]  albuterol  (VENTOLIN  HFA) 108 (90 Base) MCG/ACT inhaler Inhale 2 puffs into the lungs every 6 (six) hours as needed for wheezing or shortness of breath.    [provider]  aspirin  EC 81 MG tablet Take 81 mg by mouth at bedtime.    [provider]  Azelastine  HCl 137 MCG/SPRAY SOLN INSTILL 2 SPRAYS IN EACH NOSTRIL TWICE DAILY 08/12/24   Tobie Arleta SQUIBB, MD  Blood Glucose Monitoring Suppl (ACCU-CHEK GUIDE ME) w/Device KIT SMARTSIG:Via Meter Morning-Night 05/24/23   [provider]  Blood Glucose Monitoring Suppl DEVI 1 each by Does not apply route in the morning and at  bedtime. May substitute to any manufacturer covered by patient's insurance. 05/24/23   Lavona Agent, MD  budeson-glycopyrrolate -formoterol  (BREZTRI  AEROSPHERE) 160-9-4.8 MCG/ACT AERO inhaler INHALE TWO PUFFS INTO THE LUNGS TWICE DAILY 02/17/24   Darlean Ozell KATHEE, MD  budesonide -formoterol  (SYMBICORT ) 80-4.5 MCG/ACT inhaler Take 2 puffs first thing in am and then another 2 puffs about 12 hours later. 10/14/23   Darlean Ozell KATHEE, MD  cetirizine  (ZYRTEC ) 10 MG tablet Take 1 tablet (10 mg total) by mouth daily as needed for allergies (Can take an extra dose during flare ups.). Patient taking differently: Take 10 mg by mouth at bedtime. 02/17/24   Tobie Arleta SQUIBB, MD  cholecalciferol (VITAMIN D3) 25 MCG (1000 UNIT) tablet Take 1,000 Units by mouth daily.    [provider]  ezetimibe  (ZETIA ) 10 MG tablet TAKE 1 TABLET BY MOUTH EVERY DAY 03/16/24   Lavona Agent, MD  fluticasone  (FLONASE ) 50 MCG/ACT nasal spray INSTILL 2 SPRAYS IN EACH NOSTRIL EVERY MORNING 08/12/24   Tobie Arleta SQUIBB, MD  furosemide  (LASIX ) 40 MG tablet TAKE 1 TABLET BY MOUTH DAILY 03/16/24   Lavona Agent, MD  Glucose Blood (BLOOD GLUCOSE TEST STRIPS) STRP 1 each by In Vitro route in the morning and at bedtime. May substitute to any manufacturer covered by patient's insurance. 05/24/23   Lavona Agent, MD  Lancet Device MISC 1 each by Does not apply route 2 (two) times daily. May substitute  to any manufacturer covered by patient's insurance. 05/24/23   Lavona Agent, MD  Lancets Misc. MISC Use to test blood sugar twice daily. E11.9. #100. May substitute to any manufacturer covered by patient's insurance. 07/31/23   Lavona Agent, MD  levothyroxine  (SYNTHROID ) 150 MCG tablet Take 150 mcg by mouth daily. 01/02/22   [provider]  metoprolol  succinate (TOPROL -XL) 25 MG 24 hr tablet TAKE 1/2 TABLET BY MOUTH AT BEDTIME (PLEASE MAKE SURE TO COME TO YOUR NEXT APPOINTMENT) 08/16/23   Lavona Agent, MD  nitroGLYCERIN  (NITROSTAT ) 0.4  MG SL tablet DISSOLVE 1 TABLET UNDER THE TONGUE EVERY 5 MINUTES AS NEEDED FOR CHEST PAIN. DO NOT EXCEED A TOTAL OF 3 DOSES IN 15 MINUTES. 02/13/24   Lavona Agent, MD  omeprazole  (PRILOSEC) 40 MG capsule TAKE ONE CAPSULE BY MOUTH DAILY 06/16/24   Wert, Michael B, MD  potassium chloride  (KLOR-CON ) 10 MEQ tablet TAKE 2 TABLETS BY MOUTH DAILY 03/16/24   Lavona Agent, MD  rosuvastatin  (CRESTOR ) 40 MG tablet Take 1 tablet (40 mg total) by mouth at bedtime. 04/20/24   Lavona Agent, MD    Allergies: Penicillins    Review of Systems  Musculoskeletal:  Positive for arthralgias and joint swelling. Negative for back pain and neck pain.  Neurological:  Negative for headaches.    Updated Vital Signs BP 116/77   Pulse 97   Temp 98.1 F (36.7 C) (Oral)   Resp 17   SpO2 98%   Physical Exam CONSTITUTIONAL: Well developed/well nourished HEAD: Normocephalic/atraumatic EYES: EOMI/PERRL SPINE/BACK:entire spine nontender No bruising/crepitance/stepoffs noted to spine Chest-no tenderness ABDOMEN: soft, nontender, no bruising GU:no bruising NEURO: Pt is awake/alert/appropriate, moves all extremitiesx4.  No facial droop.   EXTREMITIES: pulses normal/equal, full ROM Mild tenderness to the left tricep, but no bruising, no deformity. Pelvis is stable Distal pulses equal and intact in both feet.  Tenderness noted over left ankle and left dorsal/proximal foot.  There is no distal foot tenderness Patient also has tenderness over the left proximal tibia and fibula All other extremities/joints palpated/ranged and nontender SKIN: warm, color normal PSYCH: no abnormalities of mood noted, alert and oriented to situation  (all labs ordered are listed, but only abnormal results are displayed) Labs Reviewed - No data to display  EKG: None  Radiology: DG Knee Left Port Result Date: 08/12/2024 EXAM: 1 or 2 VIEW(S) XRAY OF THE LEFT KNEE 08/12/2024 11:38:00 PM COMPARISON: None available. CLINICAL HISTORY:  pain. Fell thru a wooden ramp earlier tonight FINDINGS: BONES AND JOINTS: No acute fracture. No focal osseous lesion. No joint dislocation. No significant joint effusion. Slight joint space narrowing in the medial and patellofemoral compartments. SOFT TISSUES: The soft tissues are unremarkable. IMPRESSION: 1. No acute fracture or dislocation. 2. Early degenerative changes with slight joint space narrowing in the medial and patellofemoral compartments. Electronically signed by: Franky Crease MD 08/12/2024 11:41 PM EDT RP Workstation: [id number]   DG Ankle Complete Left Result Date: 08/12/2024 EXAM: 3 OR MORE VIEW(S) XRAY OF THE LEFT ANKLE 08/12/2024 10:12:00 PM CLINICAL HISTORY: injury. Clemens thru his wooden ramp earlier this evening - edema noted @ top of foot near ankle region COMPARISON: None available. FINDINGS: BONES AND JOINTS: Small avulsed fragments of the anterior distal talus. No joint dislocation. SOFT TISSUES: The soft tissues are unremarkable. IMPRESSION: 1. Small avulsion fractures of the anterior distal talus. Electronically signed by: Franky Crease MD 08/12/2024 10:15 PM EDT RP Workstation: [id number]     .Ortho Injury Treatment  Date/Time: 08/12/2024 11:52 PM  Performed by: Midge Golas, MD Authorized by: Midge Golas, MD   Consent:    Consent obtained:  Verbal   Consent given by:  PatientInjury location: foot Location details: left foot Injury type: fracture Fracture type: talar Pre-procedure neurovascular assessment: neurovascularly intact Pre-procedure distal perfusion: normal Pre-procedure neurological function: normal Pre-procedure range of motion: reduced  Anesthesia: Local anesthesia used: no  Patient sedated: NoManipulation performed: no Immobilization: splint Splint type: short leg Splint Applied by: ED Nurse Supplies used: Ortho-Glass Post-procedure neurovascular assessment: post-procedure neurovascularly intact Post-procedure distal perfusion:  normal Post-procedure neurological function: normal Post-procedure range of motion: unchanged      Medications Ordered in the ED  HYDROcodone -acetaminophen  (NORCO/VICODIN) 5-325 MG per tablet 2 tablet (2 tablets Oral Given 08/12/24 2316)    Clinical Course as of 08/12/24 2354  Wed Aug 12, 2024  2350 Patient presents after accidental slip and fall in his wheelchair ramp injuring his left lower extremity.  No signs of any head or neck trauma. No signs of any trauma to his back or torso.  His upper extremities are unremarkable.  He had mild tenderness to his left knee and proximal tibia, but no fracture noted when I reviewed the x-ray. Patient was found to have avulsion fractures of the left talus.   [DW]  2351 Patient is overall stable for discharge home.  He has been provided oral Vicodin for pain relief with improvement of his pain.   [DW]  2351  will plan for nonweightbearing status for the next week with splint and crutches, follow-up with orthopedics [DW]    Clinical Course User Index [DW] Midge Golas, MD           Glasgow Coma Scale Score: 15      NEXUS Criteria Score: 0                Medical Decision Making Amount and/or Complexity of Data Reviewed Radiology: ordered.  Risk Prescription drug management.        Final diagnoses:  Closed displaced fracture of body of left talus, initial encounter    ED Discharge Orders          Ordered    HYDROcodone -acetaminophen  (NORCO/VICODIN) 5-325 MG tablet  Every 6 hours PRN        08/12/24 2319               Midge Golas, MD 08/12/24 2354

## 2024-08-12 NOTE — Patient Instructions (Signed)
 Medication Instructions:  Your physician recommends that you continue on your current medications as directed. Please refer to the Current Medication list given to you today.  *If you need a refill on your cardiac medications before your next appointment, please call your pharmacy*  Lab Work: Fasting lipid panel, BMET, Lpa today at [facility] If you have labs (blood work) drawn today and your tests are completely normal, you will receive your results only by: MyChart Message (if you have MyChart) OR A paper copy in the mail If you have any lab test that is abnormal or we need to change your treatment, we will call you to review the results.  Testing/Procedures: PET CT Stress Test  Follow-Up: At [facility], you and your health needs are our priority.  As part of our continuing mission to provide you with exceptional heart care, our providers are all part of one team.  This team includes your primary Cardiologist (physician) and Advanced Practice Providers or APPs (Physician Assistants and Nurse Practitioners) who all work together to provide you with the care you need, when you need it.  Your next appointment:   1 year  Provider:   Lavona, MD  We recommend signing up for the patient portal called MyChart.  Sign up information is provided on this After Visit Summary.  MyChart is used to connect with patients for Virtual Visits (Telemedicine).  Patients are able to view lab/test results, encounter notes, upcoming appointments, etc.  Non-urgent messages can be sent to your provider as well.   To learn more about what you can do with MyChart, go to [email or web address].   Other Instructions    Please report to Radiology at the [facility] Main Entrance 30 minutes early for your test.  [address] [place], [place] [address]  How to Prepare for Your Cardiac PET/CT Stress Test:  Nothing to eat or drink, except water, 3 hours prior to arrival time.  NO  caffeine/decaffeinated products, or chocolate 12 hours prior to arrival. (Please note decaffeinated beverages (teas/coffees) still contain caffeine).  If you have caffeine within 12 hours prior, the test will need to be rescheduled.  Medication instructions: Do not take erectile dysfunction medications for 72 hours prior to test (sildenafil, tadalafil) Do not take nitrates (isosorbide  mononitrate, Ranexa) the day before or day of test Do not take tamsulosin the day before or morning of test Hold theophylline containing medications for 12 hours. Hold Dipyridamole 48 hours prior to the test.  Diabetic Preparation: If able to eat breakfast prior to 3 hour fasting, you may take all medications, including your insulin. Do not worry if you miss your breakfast dose of insulin - start at your next meal. If you do not eat prior to 3 hour fast-Hold all diabetes (oral and insulin) medications. Patients who wear a continuous glucose monitor MUST remove the device prior to scanning.  You may take your remaining medications with water.  NO perfume, cologne or lotion on chest or abdomen area. FEMALES - Please avoid wearing dresses to this appointment.  Total time is 1 to 2 hours; you may want to bring reading material for the waiting time.  IF YOU THINK YOU MAY BE PREGNANT, OR ARE NURSING PLEASE INFORM THE TECHNOLOGIST.  In preparation for your appointment, medication and supplies will be purchased.  Appointment availability is limited, so if you need to cancel or reschedule, please call the Radiology Department Scheduler at [phone] 24 hours in advance to avoid a cancellation fee  of $100.00  What to Expect When you Arrive:  Once you arrive and check in for your appointment, you will be taken to a preparation room within the Radiology Department.  A technologist or Nurse will obtain your medical history, verify that you are correctly prepped for the exam, and explain the procedure.  Afterwards, an  IV will be started in your arm and electrodes will be placed on your skin for EKG monitoring during the stress portion of the exam. Then you will be escorted to the PET/CT scanner.  There, staff will get you positioned on the scanner and obtain a blood pressure and EKG.  During the exam, you will continue to be connected to the EKG and blood pressure machines.  A small, safe amount of a radioactive tracer will be injected in your IV to obtain a series of pictures of your heart along with an injection of a stress agent.    After your Exam:  It is recommended that you eat a meal and drink a caffeinated beverage to counter act any effects of the stress agent.  Drink plenty of fluids for the remainder of the day and urinate frequently for the first couple of hours after the exam.  Your doctor will inform you of your test results within 7-10 business days.  For more information and frequently asked questions, please visit our website: [email or web address]  For questions about your test or how to prepare for your test, please call: Cardiac Imaging Nurse Navigators Office: [phone]

## 2024-08-12 NOTE — Discharge Instructions (Addendum)
 Please use your crutches every day until you see the bone specialist next week  When you are not walking, please keep your left leg elevated above your heart.  You can place ice packs on top of the splint to help with swelling

## 2024-08-14 LAB — LIPOPROTEIN A (LPA): Lipoprotein (a): 254.3 nmol/L — ABNORMAL HIGH (ref <75.0)

## 2024-08-14 LAB — LIPID PANEL
Chol/HDL Ratio: 3.4 ratio (ref 0.0–5.0)
Cholesterol, Total: 121 mg/dL (ref 100–199)
HDL: 36 mg/dL — ABNORMAL LOW (ref >39)
LDL Chol Calc (NIH): 54 mg/dL (ref 0–99)
Triglycerides: 184 mg/dL — ABNORMAL HIGH (ref 0–149)
VLDL Cholesterol Cal: 31 mg/dL (ref 5–40)

## 2024-08-14 LAB — BASIC METABOLIC PANEL WITH GFR
BUN/Creatinine Ratio: 8 — ABNORMAL LOW (ref 10–24)
BUN: 9 mg/dL (ref 8–27)
CO2: 23 mmol/L (ref 20–29)
Calcium: 9.3 mg/dL (ref 8.6–10.2)
Chloride: 100 mmol/L (ref 96–106)
Creatinine, Ser: 1.13 mg/dL (ref 0.76–1.27)
Glucose: 79 mg/dL (ref 70–99)
Potassium: 4.4 mmol/L (ref 3.5–5.2)
Sodium: 142 mmol/L (ref 134–144)
eGFR: 73 mL/min/1.73 (ref >59)

## 2024-08-16 ENCOUNTER — Ambulatory Visit: Payer: Self-pay | Admitting: Cardiology

## 2024-08-18 ENCOUNTER — Ambulatory Visit: Admitting: Orthopedic Surgery

## 2024-08-18 DIAGNOSIS — S92152A Displaced avulsion fracture (chip fracture) of left talus, initial encounter for closed fracture: Secondary | ICD-10-CM | POA: Diagnosis not present

## 2024-08-18 NOTE — Progress Notes (Unsigned)
 New Patient Visit  Assessment: James Huynh is a 63 y.o. male with the following: 1. Closed displaced avulsion fracture of left talus, initial encounter ***   Plan: James Huynh   Follow-up: Return if symptoms worsen or fail to improve.  Subjective:  Chief Complaint  Patient presents with   Fracture    L foot DOI 08/12/24.     History of Present Illness: James Huynh is a 63 y.o. male who {Presentation:27320} for evaluation of    Review of Systems: No fevers or chills*** No numbness or tingling No chest pain No shortness of breath No bowel or bladder dysfunction No GI distress No headaches   Medical History:  Past Medical History:  Diagnosis Date   Asthma    Bronchitis    CAD (coronary artery disease)    cath 09/09/2015 20% prox RCA, 80% prox to mid LCx treated with DES (3.518 mm Xience Alpine DES), 80% mid LAD lesion treated with 3.518 mm Xience Alpine DES stent postdilated to 4.2.   Chest pain    a. negative stress echo August 2011   COPD (chronic obstructive pulmonary disease) (HCC)    COPD (chronic obstructive pulmonary disease) (HCC)    Diabetes mellitus without complication (HCC)    GERD (gastroesophageal reflux disease)    High cholesterol    History of pneumonia    a. 2014.   Hypothyroidism    Morbid obesity (HCC)    Myocardial infarction (HCC) 01/2022   x 2   Recurrent upper respiratory infection (URI)    Sleep apnea    Tobacco abuse     Past Surgical History:  Procedure Laterality Date   CARDIAC CATHETERIZATION N/A 09/09/2015   Procedure: Left Heart Cath and Coronary Angiography;  Surgeon: Debby DELENA Sor, MD;  Location: [facility] INVASIVE CV LAB;  Service: Cardiovascular;  Laterality: N/A;   CARDIAC CATHETERIZATION  09/09/2015   Procedure: Coronary Stent Intervention;  Surgeon: Debby DELENA Sor, MD;  Location: [facility] INVASIVE CV LAB;  Service: Cardiovascular;;   CORONARY STENT INTERVENTION N/A 01/17/2022   Procedure: CORONARY STENT  INTERVENTION;  Surgeon: Darron Deatrice DELENA, MD;  Location: [facility] INVASIVE CV [facility];  Service: Cardiovascular;  Laterality: N/A;   LEFT HEART CATH AND CORONARY ANGIOGRAPHY N/A 01/17/2022   Procedure: LEFT HEART CATH AND CORONARY ANGIOGRAPHY;  Surgeon: Darron Deatrice DELENA, MD;  Location: [facility] INVASIVE CV LAB;  Service: Cardiovascular;  Laterality: N/A;   lymph gland removal     MANDIBLE SURGERY     broken jaw    Family History  Problem Relation Age of Onset   Allergic rhinitis Mother    Lung cancer Mother        died in the 72's.   Heart attack Father        history of MI in his 21's with bypass surgery - now alive @ 35.   Cancer - Colon Neg Hx    Social History   Tobacco Use   Smoking status: Former    Current packs/day: 0.00    Average packs/day: 1 pack/day for 45.5 years (45.5 ttl pk-yrs)    Types: Cigarettes, Cigars    Start date: 08/06/1975    Quit date: 01/30/2021    Years since quitting: 3.5    Passive exposure: Current   Smokeless tobacco: Never  Vaping Use   Vaping status: Never Used  Substance Use Topics   Alcohol use: No    Comment: Quit 30 years ago (documented 01/13/24)   Drug use: Yes  Frequency: 1.0 times per week    Comment: occasionally smokes marijuana.-Denies 01/15/22    Allergies  Allergen Reactions   Penicillins Anaphylaxis    No outpatient medications have been marked as taking for the 08/18/24 encounter (Office Visit) with James Oneil LABOR, MD.    Objective: There were no vitals taken for this visit.  Physical Exam:  General: {General PE Findings:25791} Gait: {Gait:25792}    IMAGING: {XR Reviewed:24899}   New Medications:  No orders of the defined types were placed in this encounter.     Oneil Huynh Onesimo, MD  08/18/2024 3:29 PM

## 2024-08-18 NOTE — Patient Instructions (Signed)

## 2024-08-19 ENCOUNTER — Encounter: Payer: Self-pay | Admitting: Orthopedic Surgery

## 2024-08-31 ENCOUNTER — Encounter: Payer: Self-pay | Admitting: Internal Medicine

## 2024-09-03 ENCOUNTER — Other Ambulatory Visit: Payer: Self-pay

## 2024-09-03 DIAGNOSIS — J449 Chronic obstructive pulmonary disease, unspecified: Secondary | ICD-10-CM

## 2024-09-03 DIAGNOSIS — R058 Other specified cough: Secondary | ICD-10-CM

## 2024-09-08 ENCOUNTER — Other Ambulatory Visit (HOSPITAL_COMMUNITY)

## 2024-09-11 ENCOUNTER — Other Ambulatory Visit: Payer: Self-pay | Admitting: Internal Medicine

## 2024-09-11 ENCOUNTER — Other Ambulatory Visit: Payer: Self-pay | Admitting: Cardiology

## 2024-09-11 DIAGNOSIS — E876 Hypokalemia: Secondary | ICD-10-CM

## 2024-09-15 ENCOUNTER — Telehealth: Payer: Self-pay

## 2024-09-15 NOTE — Telephone Encounter (Signed)
 error

## 2024-09-15 NOTE — Telephone Encounter (Signed)
 Pt needs one 1 yr follow up to continue to receive supplies ordered and meds refill

## 2024-09-17 ENCOUNTER — Encounter: Payer: Self-pay | Admitting: Internal Medicine

## 2024-09-17 ENCOUNTER — Ambulatory Visit: Admitting: Internal Medicine

## 2024-09-17 ENCOUNTER — Other Ambulatory Visit: Payer: Self-pay | Admitting: Cardiology

## 2024-09-17 VITALS — BP 101/69 | HR 88 | Ht 69.0 in | Wt 241.0 lb

## 2024-09-17 DIAGNOSIS — Z87891 Personal history of nicotine dependence: Secondary | ICD-10-CM | POA: Diagnosis not present

## 2024-09-17 DIAGNOSIS — J449 Chronic obstructive pulmonary disease, unspecified: Secondary | ICD-10-CM | POA: Diagnosis not present

## 2024-09-17 DIAGNOSIS — R058 Other specified cough: Secondary | ICD-10-CM | POA: Diagnosis not present

## 2024-09-17 NOTE — Progress Notes (Signed)
 [facility], male    DOB: Jan 03, 1961   MRN: [id number]   Brief patient profile:  62  yowm  MM/Quit smoking (and inhaling) cigars  01/2021 p already stopped cigs in 1992 due to  dx of  asthma rx albuterol  and more albuterol  up to 4 inhalers  per month and much better after a week of prednisone  ref[place] to pulmonary clinic in [place]  02/13/2021 by James Medley, PA   [place] [facility] with need for trach before age [age] but parents refused then subsequenly  no problem age 74 then episodic  facial swelling  and difficulty breathing eval by [facility]  allergist in [place] > allergic to everything  took shots until age 59 still problems in spring mostly rhinitis not asthma until 1992.       History of Present Illness  02/13/2021  Pulmonary/ 1st office eval/ [facility]  Chief Complaint  Patient presents with   Pulmonary Consult    Referred by James Medley, PA. Pt c/o SOB for the past 2 years worse over the past 2 wks. Pt states that he always feels like he is unable to take a deep enough breath, feels better when he yawns. He is using his albuterol  several times per day.   Dyspnea: limited by R radicular back pain > sob  Cough: sporadic /no rhinitis at initial eval or noct cough  Sleep: no problem noct lying flat SABA use: way too much  rec Plan A = Automatic = Always=    Symbicort  160 Take 2 puffs first thing in am and then another 2 puffs about 12 hours later.  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  last pred end of Jan 2024 for cough esp hs   Chief Complaint  Patient presents with   Follow-up  Dyspnea:   back stops him before his breathing Cough: clear mucus esp in am and hs  Sleeping: bed is flat/ one pillow  SABA use: saba 3-4 x per day much less on  prednisone   02: none  Rec Plan A = Automatic = Always=    Breztri   Work on perfect technique with your inhalers like we worked on today   Plan B = Backup (to supplement plan A, not to replace it) Only use your albuterol  inhaler as a rescue medication  Plan C = Crisis (instead of Plan B but only if Plan B stops working) - only use your albuterol  nebulizer if you first try Plan B  Add pepcid  20 mg after supper  GERD diet reviewed, bed blocks rec   For cough mucinex  DM 1200 mg every 12 hours  referral to sinus CT  >nl  03/08/23        10/14/2023  f/u ov/[facility] re: GOLD 2 copd/ AB  maint on breztri   worse x 6 weeks  Already  rx Doxy / prednisone   helped 50%   Chief Complaint  Patient presents with   Follow-up    6 month follow up   Dyspnea:  back stops before breathing Cough: non-productive esp at hs  Sleeping: horizontal with one pillow  02: none  SABA :  maybe 2-3 days per day  Lung cancer screening: due in 11/2023 Recs Start gabapentin  300 mg at bedtime  x one week and can add one dose a week up to 4 x daily is maximum   For cough/ congestion >  mucinex dm (over the counter)   up to maximum of  1200 mg every 12 hours and use the flutter valve as much as you can   Zpak Prednisone  10 mg take  4 each am x 2 days,   2 each am x 2 days,  1 each am x 2 days and stop  GERD diet reviewed, bed blocks rec  Please schedule a follow up office visit in 6 weeks, call sooner if needed > did not do    [facility]  01/28/24  RADS 2   09/17/2024  f/u ov/[facility] re: GOLD 2 COPD / AB  maint on breztri    still smoking 2-3 cigs/ week/ not using gabapentin   Chief  Complaint  Patient presents with   COPD    DOE - cough at night  Refill meds and yr f/u to keep neb supply orders   Dyspnea:  back and breathing stops about the same time  Cough: / does fine in grocery store = [employer]ice Sleeping: flat bed/ one pillow/wedge s    resp cc  SABA use: 2-3 x per day usually p exerttion  02: none     No obvious day to day or daytime variability or assoc excess/ purulent sputum or mucus plugs or hemoptysis or cp or chest tightness, subjective wheeze or overt sinus or hb symptoms.    Also denies any obvious fluctuation of symptoms with weather or environmental changes or other aggravating or alleviating factors except as outlined above   No unusual exposure hx or h/o childhood pna/ asthma or knowledge of premature birth.  Current Allergies, Complete Past Medical History, Past Surgical History, Family History, and Social History were reviewed in [facility] record.  ROS  The following are not active complaints unless bolded Hoarseness, sore throat, dysphagia, dental problems, itching, sneezing,  nasal congestion or discharge of excess mucus or purulent secretions, ear ache,   fever, chills, sweats, unintended wt loss or wt gain, classically pleuritic or exertional cp,  orthopnea pnd or arm/hand swelling  or leg swelling, presyncope, palpitations, abdominal pain, anorexia, nausea, vomiting, diarrhea  or change in bowel habits or change in bladder habits, change in stools or change in urine, dysuria, hematuria,  rash, arthralgias, visual complaints, headache, numbness, weakness or ataxia or problems with walking or coordination,  change in mood or  memory.        Current Meds  Medication Sig   Accu-Chek Softclix Lancets lancets 2 (two) times daily.   albuterol  (PROVENTIL ) (2.5 MG/3ML) 0.083% nebulizer solution Take 2.5 mg by nebulization every 6 (six) hours as needed for wheezing or shortness of breath.   albuterol  (VENTOLIN  HFA) 108  (90 Base) MCG/ACT inhaler Inhale 2 puffs into the lungs every 6 (six) hours as needed for wheezing or shortness of breath.   aspirin  EC 81 MG tablet Take 81 mg by mouth at bedtime.   Blood Glucose Monitoring Suppl (ACCU-CHEK GUIDE  ME) w/Device KIT SMARTSIG:Via Meter Morning-Night   Blood Glucose Monitoring Suppl DEVI 1 each by Does not apply route in the morning and at bedtime. May substitute to any manufacturer covered by patient's insurance.   budeson-glycopyrrolate -formoterol  (BREZTRI  AEROSPHERE) 160-9-4.8 MCG/ACT AERO inhaler INHALE TWO PUFFS INTO THE LUNGS TWICE DAILY   budesonide -formoterol  (SYMBICORT ) 80-4.5 MCG/ACT inhaler Take 2 puffs first thing in am and then another 2 puffs about 12 hours later.   cetirizine  (ZYRTEC ) 10 MG tablet Take 1 tablet (10 mg total) by mouth daily as needed for allergies (Can take an extra dose during flare ups.). (Patient taking differently: Take 10 mg by mouth at bedtime.)   cholecalciferol (VITAMIN D3) 25 MCG (1000 UNIT) tablet Take 1,000 Units by mouth daily.   ezetimibe  (ZETIA ) 10 MG tablet TAKE 1 TABLET BY MOUTH EVERY DAY   furosemide  (LASIX ) 40 MG tablet TAKE 1 TABLET BY MOUTH DAILY   Glucose Blood (BLOOD GLUCOSE TEST STRIPS) STRP 1 each by In Vitro route in the morning and at bedtime. May substitute to any manufacturer covered by patient's insurance.   Lancet Device MISC 1 each by Does not apply route 2 (two) times daily. May substitute to any manufacturer covered by patient's insurance.   Lancets Misc. MISC Use to test blood sugar twice daily. E11.9. #100. May substitute to any manufacturer covered by patient's insurance.   levothyroxine  (SYNTHROID ) 150 MCG tablet Take 150 mcg by mouth daily.   metoprolol  succinate (TOPROL -XL) 25 MG 24 hr tablet TAKE 1/2 TABLET BY MOUTH AT BEDTIME (PLEASE MAKE SURE TO COME TO YOUR NEXT APPOINTMENT)   nitroGLYCERIN  (NITROSTAT ) 0.4 MG SL tablet DISSOLVE 1 TABLET UNDER THE TONGUE EVERY 5 MINUTES AS NEEDED FOR CHEST PAIN. DO  NOT EXCEED A TOTAL OF 3 DOSES IN 15 MINUTES.   omeprazole  (PRILOSEC) 40 MG capsule TAKE ONE CAPSULE BY MOUTH DAILY   potassium chloride  (KLOR-CON ) 10 MEQ tablet TAKE 2 TABLETS BY MOUTH DAILY   rosuvastatin  (CRESTOR ) 40 MG tablet Take 1 tablet (40 mg total) by mouth at bedtime.             Past Medical History:  Diagnosis Date   Asthma    CAD (coronary artery disease)    cath 09/09/2015 20% prox RCA, 80% prox to mid LCx treated with DES (3.518 mm Xience Alpine DES), 80% mid LAD lesion treated with 3.518 mm Xience Alpine DES stent postdilated to 4.2.   Chest pain    a. negative stress echo August 2011   GERD (gastroesophageal reflux disease)    High cholesterol    History of pneumonia    a. 2014.   Hypothyroidism    Morbid obesity (HCC)    Tobacco abuse       Objective:    Wts  09/17/2024       241  10/14/2023     231  04/08/2023       250  02/22/2023       245 08/24/2022     251  07/19/2022       250  02/19/2022         248  08/14/2021     230   03/17/21 234 lb 9.6 oz (106.4 kg)  02/13/21 234 lb (106.1 kg)  01/17/21 236 lb 3.2 oz (107.1 kg)    Vital signs reviewed  09/17/2024  - Note at rest 02 sats  95% on RA   General appearance:    gruff voice amb wm mod obese by BMI  HEENT : Oropharynx  clear   Nasal turbinates nl    NECK :  without  apparent JVD/ palpable Nodes/TM    LUNGS: no acc muscle use,  Min barrel  contour chest wall with bilateral  slightly decreased bs s audible wheeze and  without cough on insp or exp maneuvers and min  Hyperresonant  to  percussion bilaterally    CV:  RRR  no s3 or murmur or increase in P2, and tace bilateral ankle eddema   ABD:  soft and nontender    MS:  Nl gait/ ext warm without deformities Or obvious joint restrictions  calf tenderness, cyanosis or clubbing     SKIN: warm and dry without lesions    NEURO:  alert, approp, nl sensorium with  no motor or cerebellar deficits apparent.          Assessment   Assessment &  Plan Upper airway cough syndrome - improved to his satisfaction  as of  09/17/2024 > no longer taking gabapentin   COPD GOLD II vs ACOS  Quit smoking 01/2021> still smoking 2-3 cigs per week as of 09/17/2024  -  H/o childhood allergies to everything on allergy  shots age 4 -15  - PFT's 05/08/18  FEV1 2.44 (65 % ) ratio 0.58  p 7 % improvement from saba p ? saba hfa and neb prior to study with DLCO  21.84 (67%) corrects to 3.55 (76%)  for alv volume and FV curve classic mild/mod concavity plus  ERV 35% at wt 231  - 02/13/2021  After extensive coaching inhaler device,  effectiveness =    75% (short ti) > try symbicort  160 2bid and if fails > change to breztri   - 03/17/2021  After extensive coaching inhaler device,  effectiveness =  75%> try   Breztri   2 bid and if not happy with benefits vs cost then back to stiolto 2 pffs each am  -  03/17/2021   Walked RA  approx   300 ft  @ moderate pace  stopped due end of study with sats 98%    - Labs ordered 08/14/2021  :  allergy  profile  Eos 0.1/ IgE 103   alpha one AT phenotype  MM level 141  - 08/14/2021   Walked on RA x  3  lap(s) =  approx 480ft @ mod to fast pace, stopped due to end of study, mild sob  with lowest 02 sats 95% at lowest   - 02/19/2022  After extensive coaching inhaler device,  effectiveness =    90% > continue breztri  and more approp saba - 07/19/2022   Walked on RA  x  3  lap(s) =  approx 450  ft  @ mod pace, stopped due to end of study  with lowest 02 sats 95% s sob  [facility]     12/04/22 mild centrilobularand paraseptal emphysema; imaging findings suggestive of underlying COPD. - 10/14/2023  After extensive coaching inhaler device,  effectiveness =    90% so try symbicort  80 2 bid and max rx for gerd/ add gabapentin  for chronic cough > did not do either as of 09/17/2024 but doing well on breztri    Group E in terms of symptoms/risk so  laba/lama/ICS  therefore appropriate rx at this point  >>>  continue  and more approp SABA prn.  Re SABA :  I spent  extra time with pt today reviewing appropriate use of albuterol  for prn use on exertion with the following points: 1) saba is  for relief of sob that does not improve by walking a slower pace or resting but rather if the pt does not improve after trying this first. 2) If the pt is convinced, as many are, that saba helps recover from activity faster then it's easy to tell if this is the case by re-challenging : ie stop, take the inhaler, then p 5 minutes try the exact same activity (intensity of workload) that just caused the symptoms and see if they are substantially diminished or not after saba 3) if there is an activity that reproducibly causes the symptoms, try the saba 15 min before the activity on alternate days   If in fact the saba really does help, then fine to continue to use it prn but advised may need to look closer at the maintenance regimen being used to achieve better control of airways disease with exertion.     Former cigarette smoker Quit smoking  01/2021 so eligible for annual screening thru 2037  - done feb yearly starting 2024 > last done 01/28/24 RADS 2 so repeat one year advised     AVS  Patient Instructions  No change in medications   Please schedule a follow up visit in 12  months but call sooner if needed    Ozell America, MD 09/18/2024

## 2024-09-17 NOTE — Patient Instructions (Signed)
No change in medications   Please schedule a follow up visit in 12  months but call sooner if needed  

## 2024-09-18 ENCOUNTER — Other Ambulatory Visit: Payer: Self-pay | Admitting: Cardiology

## 2024-09-18 NOTE — Assessment & Plan Note (Addendum)
 Quit smoking 01/2021> still smoking 2-3 cigs per week as of 09/17/2024  -  H/o childhood allergies to everything on allergy  shots age 63 -24  - PFT's 05/08/18  FEV1 2.44 (65 % ) ratio 0.58  p 7 % improvement from saba p ? saba hfa and neb prior to study with DLCO  21.84 (67%) corrects to 3.55 (76%)  for alv volume and FV curve classic mild/mod concavity plus  ERV 35% at wt 231  - 02/13/2021  After extensive coaching inhaler device,  effectiveness =    75% (short ti) > try symbicort  160 2bid and if fails > change to breztri   - 03/17/2021  After extensive coaching inhaler device,  effectiveness =  75%> try   Breztri   2 bid and if not happy with benefits vs cost then back to stiolto 2 pffs each am  -  03/17/2021   Walked RA  approx   300 ft  @ moderate pace  stopped due end of study with sats 98%    - Labs ordered 08/14/2021  :  allergy  profile  Eos 0.1/ IgE 103   alpha one AT phenotype  MM level 141  - 08/14/2021   Walked on RA x  3  lap(s) =  approx 465ft @ mod to fast pace, stopped due to end of study, mild sob  with lowest 02 sats 95% at lowest   - 02/19/2022  After extensive coaching inhaler device,  effectiveness =    90% > continue breztri  and more approp saba - 07/19/2022   Walked on RA  x  3  lap(s) =  approx 450  ft  @ mod pace, stopped due to end of study  with lowest 02 sats 95% s sob  [facility]     12/04/22 mild centrilobularand paraseptal emphysema; imaging findings suggestive of underlying COPD. - 10/14/2023  After extensive coaching inhaler device,  effectiveness =    90% so try symbicort  80 2 bid and max rx for gerd/ add gabapentin  for chronic cough > did not do either as of 09/17/2024 but doing well on breztri    Group E in terms of symptoms/risk so  laba/lama/ICS  therefore appropriate rx at this point  >>>  continue  and more approp SABA prn.  Re SABA :  I spent extra time with pt today reviewing appropriate use of albuterol  for prn use on exertion with the following points: 1) saba is for relief of  sob that does not improve by walking a slower pace or resting but rather if the pt does not improve after trying this first. 2) If the pt is convinced, as many are, that saba helps recover from activity faster then it's easy to tell if this is the case by re-challenging : ie stop, take the inhaler, then p 5 minutes try the exact same activity (intensity of workload) that just caused the symptoms and see if they are substantially diminished or not after saba 3) if there is an activity that reproducibly causes the symptoms, try the saba 15 min before the activity on alternate days   If in fact the saba really does help, then fine to continue to use it prn but advised may need to look closer at the maintenance regimen being used to achieve better control of airways disease with exertion.

## 2024-09-18 NOTE — Assessment & Plan Note (Signed)
-   improved to his satisfaction  as of  09/17/2024 > no longer taking gabapentin

## 2024-09-18 NOTE — Assessment & Plan Note (Addendum)
 Quit smoking  01/2021 so eligible for annual screening thru 2037  - done feb yearly starting 2024 > last done 01/28/24 RADS 2 so repeat one year advised

## 2024-09-21 ENCOUNTER — Other Ambulatory Visit: Payer: Self-pay

## 2024-09-21 MED ORDER — OMEPRAZOLE 40 MG PO CPDR: 40.0000 mg | DELAYED_RELEASE_CAPSULE | Freq: Every day | ORAL | 3 refills | Status: AC

## 2024-11-01 ENCOUNTER — Other Ambulatory Visit: Payer: Self-pay | Admitting: Internal Medicine

## 2024-11-01 DIAGNOSIS — J449 Chronic obstructive pulmonary disease, unspecified: Secondary | ICD-10-CM
